# Patient Record
Sex: Female | Born: 1950 | Race: White | Hispanic: No | Marital: Married | Smoking: Former smoker
Health system: Southern US, Community
[De-identification: ages and names within clinical notes are randomized; demographics above are authoritative.]

## PROBLEM LIST (undated history)

## (undated) DIAGNOSIS — I2510 Atherosclerotic heart disease of native coronary artery without angina pectoris: Secondary | ICD-10-CM

## (undated) DIAGNOSIS — T7840XA Allergy, unspecified, initial encounter: Secondary | ICD-10-CM

## (undated) DIAGNOSIS — M069 Rheumatoid arthritis, unspecified: Secondary | ICD-10-CM

## (undated) DIAGNOSIS — E079 Disorder of thyroid, unspecified: Secondary | ICD-10-CM

## (undated) DIAGNOSIS — E785 Hyperlipidemia, unspecified: Secondary | ICD-10-CM

## (undated) DIAGNOSIS — K7460 Unspecified cirrhosis of liver: Secondary | ICD-10-CM

## (undated) DIAGNOSIS — I8500 Esophageal varices without bleeding: Secondary | ICD-10-CM

## (undated) DIAGNOSIS — E039 Hypothyroidism, unspecified: Secondary | ICD-10-CM

## (undated) DIAGNOSIS — K219 Gastro-esophageal reflux disease without esophagitis: Secondary | ICD-10-CM

## (undated) HISTORY — DX: Hyperlipidemia, unspecified: E78.5

## (undated) HISTORY — DX: Esophageal varices without bleeding: I85.00

## (undated) HISTORY — DX: Unspecified cirrhosis of liver: K74.60

## (undated) HISTORY — DX: Atherosclerotic heart disease of native coronary artery without angina pectoris: I25.10

## (undated) HISTORY — PX: ROTATOR CUFF REPAIR: SHX139

## (undated) HISTORY — PX: TENDON RELEASE: SHX230

## (undated) HISTORY — PX: CARPAL TUNNEL RELEASE: SHX101

## (undated) HISTORY — DX: Allergy, unspecified, initial encounter: T78.40XA

## (undated) HISTORY — DX: Gastro-esophageal reflux disease without esophagitis: K21.9

## (undated) HISTORY — DX: Rheumatoid arthritis, unspecified: M06.9

## (undated) HISTORY — DX: Disorder of thyroid, unspecified: E07.9

---

## 1981-05-23 HISTORY — PX: ABDOMINAL HYSTERECTOMY: SHX81

## 1997-11-20 ENCOUNTER — Ambulatory Visit (HOSPITAL_COMMUNITY): Admission: RE | Admit: 1997-11-20 | Discharge: 1997-11-20 | Payer: Self-pay | Admitting: *Deleted

## 1998-02-07 ENCOUNTER — Encounter: Payer: Self-pay | Admitting: Emergency Medicine

## 1998-02-07 ENCOUNTER — Emergency Department (HOSPITAL_COMMUNITY): Admission: EM | Admit: 1998-02-07 | Discharge: 1998-02-07 | Payer: Self-pay | Admitting: Emergency Medicine

## 1998-02-13 ENCOUNTER — Ambulatory Visit (HOSPITAL_COMMUNITY): Admission: RE | Admit: 1998-02-13 | Discharge: 1998-02-13 | Payer: Self-pay | Admitting: Family Medicine

## 1998-02-13 ENCOUNTER — Encounter: Payer: Self-pay | Admitting: Family Medicine

## 1998-02-23 ENCOUNTER — Ambulatory Visit (HOSPITAL_COMMUNITY): Admission: RE | Admit: 1998-02-23 | Discharge: 1998-02-23 | Payer: Self-pay | Admitting: Family Medicine

## 1998-02-23 ENCOUNTER — Encounter: Payer: Self-pay | Admitting: Family Medicine

## 1999-03-17 ENCOUNTER — Encounter: Admission: RE | Admit: 1999-03-17 | Discharge: 1999-03-17 | Payer: Self-pay | Admitting: Internal Medicine

## 1999-03-17 ENCOUNTER — Encounter: Payer: Self-pay | Admitting: Internal Medicine

## 1999-04-30 ENCOUNTER — Ambulatory Visit (HOSPITAL_COMMUNITY): Admission: RE | Admit: 1999-04-30 | Discharge: 1999-04-30 | Payer: Self-pay | Admitting: Family Medicine

## 1999-04-30 ENCOUNTER — Encounter: Payer: Self-pay | Admitting: Family Medicine

## 2000-07-28 ENCOUNTER — Encounter: Payer: Self-pay | Admitting: Family Medicine

## 2000-07-28 ENCOUNTER — Encounter: Admission: RE | Admit: 2000-07-28 | Discharge: 2000-07-28 | Payer: Self-pay | Admitting: Family Medicine

## 2000-08-17 ENCOUNTER — Other Ambulatory Visit: Admission: RE | Admit: 2000-08-17 | Discharge: 2000-08-17 | Payer: Self-pay | Admitting: Family Medicine

## 2000-08-21 ENCOUNTER — Encounter: Payer: Self-pay | Admitting: Family Medicine

## 2000-08-21 LAB — CONVERTED CEMR LAB: Pap Smear: NORMAL

## 2003-11-26 ENCOUNTER — Inpatient Hospital Stay (HOSPITAL_COMMUNITY): Admission: EM | Admit: 2003-11-26 | Discharge: 2003-11-28 | Payer: Self-pay | Admitting: Emergency Medicine

## 2004-01-20 ENCOUNTER — Observation Stay (HOSPITAL_COMMUNITY): Admission: EM | Admit: 2004-01-20 | Discharge: 2004-01-21 | Payer: Self-pay | Admitting: *Deleted

## 2004-03-23 ENCOUNTER — Ambulatory Visit: Payer: Self-pay | Admitting: Family Medicine

## 2004-04-07 ENCOUNTER — Ambulatory Visit: Payer: Self-pay | Admitting: *Deleted

## 2004-05-14 ENCOUNTER — Ambulatory Visit: Payer: Self-pay | Admitting: Cardiology

## 2004-11-24 ENCOUNTER — Ambulatory Visit: Payer: Self-pay | Admitting: Family Medicine

## 2004-12-22 ENCOUNTER — Ambulatory Visit: Payer: Self-pay | Admitting: Family Medicine

## 2004-12-23 ENCOUNTER — Ambulatory Visit: Payer: Self-pay | Admitting: Family Medicine

## 2004-12-28 ENCOUNTER — Ambulatory Visit: Payer: Self-pay | Admitting: Family Medicine

## 2005-01-14 ENCOUNTER — Ambulatory Visit: Payer: Self-pay | Admitting: Cardiology

## 2005-02-28 ENCOUNTER — Ambulatory Visit: Payer: Self-pay | Admitting: Family Medicine

## 2005-03-23 ENCOUNTER — Ambulatory Visit: Payer: Self-pay | Admitting: Family Medicine

## 2005-06-21 ENCOUNTER — Ambulatory Visit: Payer: Self-pay | Admitting: Family Medicine

## 2005-08-24 ENCOUNTER — Ambulatory Visit: Payer: Self-pay | Admitting: Family Medicine

## 2005-10-21 ENCOUNTER — Ambulatory Visit: Payer: Self-pay | Admitting: Family Medicine

## 2006-01-20 ENCOUNTER — Ambulatory Visit: Payer: Self-pay | Admitting: Family Medicine

## 2006-02-24 ENCOUNTER — Ambulatory Visit: Payer: Self-pay | Admitting: Cardiovascular Disease

## 2006-05-12 ENCOUNTER — Ambulatory Visit: Payer: Self-pay | Admitting: Family Medicine

## 2006-06-12 ENCOUNTER — Ambulatory Visit: Payer: Self-pay | Admitting: Cardiovascular Disease

## 2006-06-12 LAB — CONVERTED CEMR LAB
Cholesterol: 177 mg/dL (ref 0–200)
Direct LDL: 123.4 mg/dL
HDL: 42.8 mg/dL (ref >39.0)
LDL Cholesterol: 123 mg/dL — ABNORMAL HIGH (ref 0–99)
Total CHOL/HDL Ratio: 4.1
Triglycerides: 55 mg/dL (ref 0–149)
VLDL: 11 mg/dL (ref 0–40)

## 2006-08-29 ENCOUNTER — Ambulatory Visit: Payer: Self-pay | Admitting: Cardiovascular Disease

## 2006-10-12 ENCOUNTER — Ambulatory Visit: Payer: Self-pay | Admitting: Cardiovascular Disease

## 2006-10-12 LAB — CONVERTED CEMR LAB
ALT: 31 units/L (ref 0–40)
AST: 35 units/L (ref 0–37)
Albumin: 3.8 g/dL (ref 3.5–5.2)
Alkaline Phosphatase: 74 units/L (ref 39–117)
Bilirubin, Direct: 0.1 mg/dL (ref 0.0–0.3)
Cholesterol: 158 mg/dL (ref 0–200)
HDL: 35.5 mg/dL — ABNORMAL LOW (ref >39.0)
LDL Cholesterol: 112 mg/dL — ABNORMAL HIGH (ref 0–99)
Total Bilirubin: 0.8 mg/dL (ref 0.3–1.2)
Total CHOL/HDL Ratio: 4.5
Total Protein: 8.4 g/dL — ABNORMAL HIGH (ref 6.0–8.3)
Triglycerides: 55 mg/dL (ref 0–149)
VLDL: 11 mg/dL (ref 0–40)

## 2007-11-20 ENCOUNTER — Telehealth (INDEPENDENT_AMBULATORY_CARE_PROVIDER_SITE_OTHER): Payer: Self-pay | Admitting: *Deleted

## 2008-01-01 ENCOUNTER — Encounter: Payer: Self-pay | Admitting: Family Medicine

## 2008-01-01 DIAGNOSIS — E785 Hyperlipidemia, unspecified: Secondary | ICD-10-CM

## 2008-01-01 DIAGNOSIS — M949 Disorder of cartilage, unspecified: Secondary | ICD-10-CM

## 2008-01-01 DIAGNOSIS — N6019 Diffuse cystic mastopathy of unspecified breast: Secondary | ICD-10-CM

## 2008-01-01 DIAGNOSIS — I2510 Atherosclerotic heart disease of native coronary artery without angina pectoris: Secondary | ICD-10-CM | POA: Insufficient documentation

## 2008-01-01 DIAGNOSIS — E039 Hypothyroidism, unspecified: Secondary | ICD-10-CM

## 2008-01-01 DIAGNOSIS — Z8719 Personal history of other diseases of the digestive system: Secondary | ICD-10-CM

## 2008-01-01 DIAGNOSIS — J309 Allergic rhinitis, unspecified: Secondary | ICD-10-CM | POA: Insufficient documentation

## 2008-01-01 DIAGNOSIS — K219 Gastro-esophageal reflux disease without esophagitis: Secondary | ICD-10-CM

## 2008-01-01 DIAGNOSIS — M899 Disorder of bone, unspecified: Secondary | ICD-10-CM | POA: Insufficient documentation

## 2008-01-01 DIAGNOSIS — G4700 Insomnia, unspecified: Secondary | ICD-10-CM | POA: Insufficient documentation

## 2008-01-02 ENCOUNTER — Ambulatory Visit: Payer: Self-pay | Admitting: Family Medicine

## 2008-01-02 DIAGNOSIS — Z87891 Personal history of nicotine dependence: Secondary | ICD-10-CM

## 2008-01-03 LAB — CONVERTED CEMR LAB
BUN: 7 mg/dL (ref 6–23)
Basophils Absolute: 0.1 10*3/uL (ref 0.0–0.1)
Bilirubin, Direct: 0.1 mg/dL (ref 0.0–0.3)
Chloride: 107 meq/L (ref 96–112)
Cholesterol: 297 mg/dL (ref 0–200)
Direct LDL: 248 mg/dL
Eosinophils Absolute: 0.2 10*3/uL (ref 0.0–0.7)
Glucose, Bld: 129 mg/dL — ABNORMAL HIGH (ref 70–99)
HCT: 46.2 % — ABNORMAL HIGH (ref 36.0–46.0)
MCHC: 34.9 g/dL (ref 30.0–36.0)
MCV: 96.3 fL (ref 78.0–100.0)
Monocytes Absolute: 0.7 10*3/uL (ref 0.1–1.0)
Platelets: 214 10*3/uL (ref 150–400)
Potassium: 4.5 meq/L (ref 3.5–5.1)
RDW: 13 % (ref 11.5–14.6)
Sodium: 140 meq/L (ref 135–145)
TSH: 5.2 microintl units/mL (ref 0.35–5.50)
Total Bilirubin: 0.7 mg/dL (ref 0.3–1.2)
Total CHOL/HDL Ratio: 11
Triglycerides: 56 mg/dL (ref 0–149)

## 2008-01-07 ENCOUNTER — Telehealth (INDEPENDENT_AMBULATORY_CARE_PROVIDER_SITE_OTHER): Payer: Self-pay | Admitting: *Deleted

## 2009-08-04 ENCOUNTER — Ambulatory Visit: Payer: Self-pay | Admitting: Family Medicine

## 2009-09-02 ENCOUNTER — Telehealth: Payer: Self-pay | Admitting: Family Medicine

## 2009-09-21 ENCOUNTER — Ambulatory Visit: Payer: Self-pay | Admitting: Family Medicine

## 2009-11-26 ENCOUNTER — Ambulatory Visit: Payer: Self-pay | Admitting: Family Medicine

## 2009-11-26 DIAGNOSIS — D485 Neoplasm of uncertain behavior of skin: Secondary | ICD-10-CM

## 2009-11-30 LAB — CONVERTED CEMR LAB
AST: 30 units/L (ref 0–37)
BUN: 10 mg/dL (ref 6–23)
Calcium: 9.6 mg/dL (ref 8.4–10.5)
Direct LDL: 230.8 mg/dL
GFR calc non Af Amer: 87.37 mL/min (ref >60)
Glucose, Bld: 100 mg/dL — ABNORMAL HIGH (ref 70–99)
HDL: 42.3 mg/dL (ref >39.00)
Potassium: 4.3 meq/L (ref 3.5–5.1)
Sodium: 139 meq/L (ref 135–145)
Triglycerides: 71 mg/dL (ref 0.0–149.0)
VLDL: 14.2 mg/dL (ref 0.0–40.0)

## 2010-01-28 ENCOUNTER — Ambulatory Visit: Payer: Self-pay | Admitting: Family Medicine

## 2010-01-28 DIAGNOSIS — M5412 Radiculopathy, cervical region: Secondary | ICD-10-CM | POA: Insufficient documentation

## 2010-04-21 LAB — CONVERTED CEMR LAB
Cholesterol: 234 mg/dL — ABNORMAL HIGH (ref 0–200)
Direct LDL: 186.6 mg/dL
HDL: 35.8 mg/dL — ABNORMAL LOW (ref >39.00)
VLDL: 10.8 mg/dL (ref 0.0–40.0)
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
## 2010-06-22 NOTE — Progress Notes (Signed)
Summary: wrist pain  Phone Note Call from Patient Call back at [facility] Phone [phone] Call back at Work Phone [phone]   Caller: Patient Call For: Judith Part MD Summary of Call: Pt states she has pain in both wrists x 2 weeks.  She has been taking zocor for about a month and asks it that could be the cause.  This pain wakes her up at night.  Please advise. Initial call taken by: Lowella Petties CMA,  September 02, 2009 8:54 AM  Follow-up for Phone Call        I doubt it -- if pain from her zocor-would expect all over body if this continues - sched appt with Dr Patsy Lager (sport med) or myself Follow-up by: Judith Part MD,  September 02, 2009 9:03 AM  Additional Follow-up for Phone Call Additional follow up Details #1::        Patient notified as instructed by telephone. Lewanda Rife LPN  September 02, 2009 1:02 PM

## 2010-10-08 NOTE — Letter (Signed)
February 24, 2006    Marne A. Tower, MD  [address] [place], [place] [address]   RE:  Jean Tucker, Jean Tucker  MRN:  [id number]  /  DOB:  [birth year]   Dear Dr. Milinda Antis:   It was my pleasure to evaluate Ferol Luz at the [facility] this morning.  As you know she is a very pleasant 60 year old woman  with coronary artery disease who is status post balloon angioplasty of a  diagonal branch back in 2005.  She has done very well from a symptomatic  standpoint since that time.  Today she reports no recent chest pain,  dyspnea, orthopnea, PND, palpitations, lightheadedness, syncope, edema, or  claudication symptoms.  She is doing an excellent job with exercises. She is  briskly walking two miles daily which takes her approximately 30 minutes.  She has worked down from that same distance taking 50 minutes.  She  continues to smoke cigarettes at a reduced number of  four to five per day.   She recently had blood work done dated January 20, 2006.  She had a mildly  elevated glucose of 105, normal creatinine of 0.8 and marked hyperlipidemia  with a total cholesterol of 343, normal triglycerides at 88, low HDL of 34,  and an elevated LDL of 298.  Her hemoglobin A1C was in the normal level at  5.7.   She has taken Lipitor in the past but had elevated liver function tests and  this was discontinued.   CURRENT MEDICATIONS:  1. Aspirin 325 mg daily.  2. Synthroid 50 mcg daily.  3. Multivitamin daily.   PHYSICAL EXAMINATION:  GENERAL:  She is alert and oriented, in no acute  distress.  VITAL SIGNS:  Weight is 186 pounds, blood pressure 146/88. On my recheck was  150/84.  Heart rate 67, respiratory rate 12.  HEENT:  Sclerae anicteric.  Conjunctivae pink.  ENT exam shows oropharynx is  clear, moist oral mucosa.  NECK:  Normal carotid upstrokes without bruits.  Jugular venous pressure is  normal.  LUNGS:  Clear to auscultation bilaterally.  CARDIOVASCULAR:  The apex is  discrete and nondisplaced.  Heart is regular  rate and rhythm without murmurs or gallops.  ABDOMEN:  Soft, nontender.  No organomegaly.  No abdominal bruits.  EXTREMITIES:  No clubbing, cyanosis or edema.  Peripheral pulses are 2+ and  equal throughout.   Her XBJ:[id number] normal sinus rhythm and is within normal limits.   ASSESSMENT:  This is a 60 year old woman with the following cardiovascular  issues:  1. Coronary artery disease.  She remains stable without angina.  No      indication for any ischemic evaluation at this point.  Will continue      with risk factor modification as outlined below. She should continue on      daily aspirin.  2. Dyslipidemia.  The patient has a markedly elevated LDL.  She has been      intolerant to Lipitor due to elevated liver function tests.  Will start      her on 10 mg of Crestor.  She will clearly need upward titration and      likely multiple medications for her dyslipidemia.  I would be happy to      help guide her therapy.  I have asked her to have repeat lipids and      LFTs drawn at the time of her next blood work and that is currently  scheduled in your office for December.  At that point we will make      necessary adjustments.  3. High blood pressure in the office today.  The patient reports normal      blood pressures in your office and when checked on a regular  basis.      We should watch her blood pressure closely as she may ultimately      require treatment for hypertension if this is documented on subsequent      office visits.  If she requires therapy, would recommend initiation of      a low dose beta blocker to start in the setting of her coronary artery      disease.   I plan on seeing her back in clinic in six months or sooner if any new  issues arise.   Thank you again for the opportunity to evaluate Mrs. Beers.  Please feel  free to contact me at any time with questions regarding her care.    Sincerely,      Veverly Fells. Excell Seltzer, MD    MDC/MedQ  /  Job #:  [id number]  DD:  02/24/2006 / DT:  02/26/2006

## 2010-10-08 NOTE — H&P (Signed)
NAME:  Jean Tucker, Jean Tucker                         ACCOUNT NO.:  [id number]   MEDICAL RECORD NO.:  [id number]                   PATIENT TYPE:  INP   LOCATION:  1826                                 FACILITY:  [facility]   PHYSICIAN:  Eudora Bing, M.D.               DATE OF BIRTH:  1951-04-04   DATE OF ADMISSION:  11/26/2003  DATE OF DISCHARGE:                                HISTORY & PHYSICAL   REFERRING PHYSICIAN:  Lavonda Jumbo, M.D.   PRIMARY CARE PHYSICIAN:  Marne A. Tower, M.D. [facility]   HISTORY OF PRESENT ILLNESS:  A 60 year old woman admitted with angina.  The  patient has no significant past cardiac history.  She has never had cardiac  symptoms, nor undergone cardiology assessment or testing.  For the past two  months, she has noted exertional chest tightness associated with dyspnea  that has been progressive.  The pain is moderately intense.  There is some  radiation to the left shoulder.  There is generally associated dyspnea and  sometimes diaphoresis.  Symptoms last a number of minutes and resolve with  rest.  The trigger has always been exertional, but the level of exertion has  progressively decreased.   Cardiovascular risk factors include a long history of tobacco use that has  gradually decreased in recent years.  She currently smokes just a few  cigarettes per day, but total consumption is approximately 50-pack-years.  She has not had hypertension or diabetes.  She underwent a partial  hysterectomy at age 10 and has had symptoms of the menopause in the past two  years.  She has not been told of hyperlipidemia.  She does have a family  history of coronary artery disease - both her mother and father suffered  myocardial infarctions.   PAST MEDICAL HISTORY:  Otherwise notable for bilateral shoulder surgery.  She reports an adverse effect to Codeine in the past - she has no true  medical allergies.  She takes no medication on a routine basis.   SOCIAL HISTORY:  She lives in  [place] with her husband.  Employed as  an Airline pilot.  Has three children.  Denies excessive use of alcohol.   REVIEW OF SYSTEMS:  Mild dyspnea on exertion; all other systems reviewed and  are negative.   PHYSICAL EXAMINATION:  GENERAL:  Pleasant woman in no acute distress.  VITAL SIGNS:  Temperature 98, heart rate 70, respirations 20, blood pressure  165/75.  O2 saturation 98% on room air.  HEENT:  Normal lids and conjunctivae; anicteric sclerae.  NECK:  No jugular venous distention; no carotid bruits.  ENDOCRINE:  No thyromegaly.  HEMATOPOIETIC:  No adenopathy.  LUNGS:  Clear.  HEART:  Grade 1/6 systolic murmur at the left sternal border.  Normal first  and second heart sounds.  ABDOMEN:  Soft and nontender; no bruits; no organomegaly.  Aortic pulsation  not palpable.  EXTREMITIES:  Normal distal pulses.  No edema.  NEUROMUSCULAR:  Symmetric strength and tone.  MUSCULOSKELETAL:  No significant joint deformities.   EKG; sinus rhythm, within normal limits.   Point of care markers negative.  Other laboratory studies pending.   IMPRESSION:  The patient presents with classic progressive angina without  excessive risk factors. The likelihood of coronary artery disease as well  over 50%.  The risks and benefits of coronary angiography were described to  her - she agrees to proceed.  In the interim, she would be treated with beta  blocker, aspirin, IV nitroglycerin, and IV heparin.   She has been encouraged to completely give up the use of tobacco products.  A lipid profile is pending.  Blood pressure is currently elevated and may  require treatment, but no specific antihypertensives will be initiated at  present.                                                Tuttle Bing, M.D.    RR/MEDQ  D:  11/26/2003  T:  11/26/2003  Job:  [id number]

## 2010-10-08 NOTE — Assessment & Plan Note (Signed)
Jean Tucker                            CARDIOLOGY OFFICE NOTE   NAME:Jean Tucker                      MRN:          [id number]  DATE:08/29/2006                            DOB:          [birth year]    Jean Tucker returns for outpatient followup at the [facility] on August 29, 2006. She is a 60 year old woman with coronary artery  disease who had angioplasty of a diagonal branch back in 2005. She had  no other significant disease at that time. She presents for followup of  her coronary artery disease, dyslipidemia, and hypertension.   She continues to do well from a symptomatic standpoint. She has no chest  pain, dyspnea, palpitations, lightheadedness, syncope, orthopnea or PND.   MEDICATIONS:  Are unchanged and include:  1. Multivitamin daily.  2. Synthroid 50 mcg daily.  3. Aspirin 325 mg daily.  4. Crestor 10 mg daily.  5. Calcium plus D daily.   ALLERGIES:  PLAVIX AND CODEINE.   PHYSICAL EXAMINATION:  She is alert and oriented in no acute distress.  Blood pressure is 125/78, heart rate 71, respiratory rate is 16, weight  is 189 pounds.  HEENT: Normal.  NECK: Normal carotid upstrokes without bruits. Jugular venous pressure  is normal.  LUNGS:  Clear to auscultation bilaterally.  HEART: The heart is regular rate and rhythm without murmurs or gallops.  ABDOMEN: Soft, obese and nontender. No organomegaly.  EXTREMITIES: No clubbing, cyanosis or edema. Peripheral pulses are 2+  and equal throughout.   EKG: Shows normal sinus rhythm and is within normal limits.   Lipids from January 21, show a total cholesterol of 177, triglycerides  of 55, HDL of 43, LDL of 123. This is in comparison to lipids from August 24, 2005, which show a total cholesterol of 369, triglycerides of 71, HDL  of 39 and LDL of 315.   ASSESSMENT:  1. Coronary artery disease. The patient had diagonal branch disease      treated with angioplasty. She has no  evidence of recurrent ischemia      or problems. She should continue with aggressive risk factor      modification to include aspirin and statin therapy.  2. Dyslipidemia. 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 She has had a dramatic response to low dose      Crestor. We increased her Crestor to 20 mg in late January to try      to get her down to goal, which I think an LDL cholesterol less than      100 would be fantastic considering her starting LDL was 315. She is      scheduled for repeat lipids on May 22.   For followup, I will plan on seeing her back in one year. She will  continue to follow with Dr. Milinda Tucker for her primary care.     Veverly Fells. Excell Seltzer, MD  Electronically Signed    MDC/MedQ  DD: 08/29/2006  DT: 08/29/2006  Job #: [id number]   cc:   Jean A.  Milinda Antis, MD

## 2010-10-08 NOTE — Cardiovascular Report (Signed)
NAME:  Jean, Tucker                         ACCOUNT NO.:  [id number]   MEDICAL RECORD NO.:  [id number]                   PATIENT TYPE:  INP   LOCATION:  4710                                 FACILITY:  [facility]   PHYSICIAN:  Carole Binning, M.D. [facility]         DATE OF BIRTH:  04-16-1951   DATE OF PROCEDURE:  01/20/2004  DATE OF DISCHARGE:  01/21/2004                              CARDIAC CATHETERIZATION   PROCEDURE PERFORMED:  Left heart catheterization with coronary arteriography  and left ventriculography.   INDICATION:  [name] is a 60 year old woman who underwent PTCA of a  diagonal branch approximately seven weeks ago.  She presented to the office  today with three days of progressing substernal chest pain including chest  pain at rest today.  She was thus referred for urgent cardiac  catheterization.   PROCEDURAL NOTE:  A 6-French sheath was placed in the right femoral artery.  Coronary angiography was performed using standard Judkins' 6-French  catheters.  Left ventriculography was performed with an angled pigtail  catheter.  Contrast was Omnipaque.  There were no complications.   RESULTS:   HEMODYNAMICS:  Left ventricular pressure 128/14, aortic pressure 140/76.  There is no aortic valve gradient.   LEFT VENTRICULOGRAM:  Wall motion is normal.  Ejection fraction is estimated  at greater than 60%.  There is no mitral regurgitation.   CORONARY ARTERIOGRAPHY (LEFT DOMINANT):  The left main is normal.   The left anterior descending artery has a diffuse 30% stenosis in the  proximal vessel and 30% stenosis in the mid-vessel.  The LAD gives rise to a  single small-to-normal size diagonal branch.  This has a diffuse 25%  stenosis at the previous PTCA site.   The left circumflex is a dominant vessel.  It gives rise to a small first  marginal, small second marginal, a normal size third marginal, a normal size  first posterolateral branch, a small second posterolateral  branch, and a  small posterior descending artery.  The first marginal which is a small  vessel has a 75% stenosis at its ostium.  The second marginal which is also  a small vessel has a 50% stenosis at its ostium.  The third marginal which  is normal in size has a 30% stenosis proximally and a 30% stenosis in the  mid-portion.  The first posterolateral branch has a 30% stenosis in the mid-  portion.   The right coronary artery is a small, nondominant vessel.  There is a 50%  stenosis proximally, followed by a 95% stenosis in the mid-vessel.  This is  unchanged from previous catheterization.   IMPRESSIONS:  1.  Normal left ventricular systolic function.  2.  Patent percutaneous transluminal coronary angioplasty site in the      diagonal branch.  3.  Residual small vessel disease which is unchanged from findings at      previous catheterization.   RECOMMENDATIONS:  Medical therapy.  Carole Binning, M.D. [facility]    MWP/MEDQ  D:  01/20/2004  T:  01/21/2004  Job:  [id number]   cc:   Marne A. Tower, M.D. [facility]   Cardiac Cath Lab

## 2010-10-08 NOTE — Discharge Summary (Signed)
NAME:  Jean Tucker, Jean Tucker                         ACCOUNT NO.:  [id number]   MEDICAL RECORD NO.:  [id number]                   PATIENT TYPE:  INP   LOCATION:  6531                                 FACILITY:  [facility]   PHYSICIAN:  Villard Bing, M.D.               DATE OF BIRTH:  1951/04/06   DATE OF ADMISSION:  11/26/2003  DATE OF DISCHARGE:  11/28/2003                           DISCHARGE SUMMARY - REFERRING   PROCEDURE:  Percutaneous balloon angioplasty first diagonal, November 27, 2003.   REASON FOR ADMISSION:  Please refer to dictated admission note.   LABORATORY DATA:  Serial cardiac markers normal.  CBC normal.  B-MET normal.  CPK 43 (post __________  PCI).  Marginally elevated TSH 5.52.  Lipid  profile:  Total cholesterol 260, HDL 42, LDL 203.   Admission chest x-ray:  No congestive heart failure.   HOSPITAL COURSE:  The patient was admitted for evaluation and treatment of  symptoms suggestive of unstable angina pectoris.  She presented on no prior  medication regimen and was started on aspirin, beta-blocker, Lipitor, IV  nitroglycerin, and heparin.  Serial cardiac markers were normal.  The plan  was to proceed with diagnostic coronary angiography.   CARDIAC CATHETERIZATION:  Performed by Dr. Antoine Poche (see report for full  details).  Notable for:  1. A high grade distal RCA (non-dominant) lesion before the RV branch and a     high grade mid first diagonal lesion.  2. Residual anatomy notable for noncritical LAD and circumflex disease.  3. Left ventricular function was normal.  4. Dr. Antoine Poche concluded that this anatomy represented severe branch vessel     including the non-dominant RCA stenosis.  He recommended proceeding with     PCI of the diagonal branch.   This was subsequently successfully performed by Dr. Daisey Must (see  report for full details), with successful balloon dilatation of the 99%  first diagonal lesion to 25% residual stenosis.  There were no noted  complications.   The patient was treated with Integrilin for 18 hours and recommendation is  to continue on Plavix for treatment of acute coronary syndrome.  The patient  was kept for overnight observation and cleared for discharge the following  morning in a hemodynamically stable condition.  Of note, the patient was  noted to have sinus bradycardia in the 40-50 BPM range.  A singular two-  second pause was noted, prior to intervention.  The patient did only receive  one 25 mg dose of Lopressor during her brief stay.  Plans were to  discontinue beta-blocker at the time of discharge.  Of note, the patient  will need a followup of marginally elevated TSH level by her primary care  physician.   MEDICATIONS AT DISCHARGE:  1. Plavix 75 mg every day.  2. Coated aspirin 325 mg every day.  3. Lipitor 40 mg every day.  4. Nitrostat 0.4 mg p.r.n.   INSTRUCTIONS:  1. No heavy lifting, driving x 2 days.  2. Maintain a low fat/cholesterol diet.  3. Call the office if there is any swelling, bleeding at the groin.  4. The patient is allowed to return to work in one week.   The patient will follow up with Dr. Loraine [facility] on July 25th  at 4 p.m.  Subsequent followup will be with Dr. Loraine Leriche Puls[facility].   DISCHARGE DIAGNOSES:  1. Acute coronary syndrome.     a. Normal serial cardiac markers.     b. Status post percutaneous transluminal coronary angioplasty 99% first        diagonal, July 7th.     c. Residual distal 99% RCA (nondominant).     d. Normal left ventricle.  2. Sinus bradycardia.  3. Dyslipidemia.  4. Tobacco.  5. Transient hypertension.  6. Mildly elevated TSH.      Gene Serpe, P.A. [facility]                      [place] Bing, M.D.    GS/MEDQ  D:  11/28/2003  T:  11/28/2003  Job:  [id number]   cc:   Marne A. Milinda Antis, M.D. [facility]

## 2010-10-08 NOTE — Cardiovascular Report (Signed)
NAME:  Jean Tucker, Jean Tucker                         ACCOUNT NO.:  [id number]   MEDICAL RECORD NO.:  [id number]                   PATIENT TYPE:  INP   LOCATION:  6599                                 FACILITY:  [facility]   PHYSICIAN:  Carole Binning, M.D. [facility]         DATE OF BIRTH:  12/23/1950   DATE OF PROCEDURE:  11/27/2003  DATE OF DISCHARGE:                              CARDIAC CATHETERIZATION   PROCEDURE PERFORMED:  Percutaneous transluminal coronary angioplasty of the  first diagonal branch.   INDICATION:  [name] is a 60 year old woman who is admitted with  unstable angina.  Cardiac catheterization performed earlier today by Rollene Rotunda, M.D., revealed a 99% stenosis and a small to moderate sized  diagonal branch arising to the mid LAD.  After review of the images, we  opted to proceed with percutaneous coronary intervention.   PROCEDURE NOTE:  The preexisting 6-French sheath in the right femoral artery  was exchanged over a wire for a sterile 6-French sheath.  Heparin and  Integrilin were administered per protocol.  We used a 6-French JL4 guiding  catheter.  __________ soft coronary guidewire was advanced under  fluoroscopic guidance into the distal aspect of the first diagonal branch.  We initially tried to cross the lesion with a 2.0 x 15 mm Maverick balloon;  however, this would not cross the lesion due to the severity of the stenosis  and small vessel size.  We therefore went back in with a 1.5 x 15 mm  Maverick balloon and were able to cross the lesion.  We performed two  inflations to 12 atm each.  We then went back with our 2.0 x 15 mm Maverick  balloon and this time, we were able to successfully cross the lesion.  We  performed four inflations.  The initial one was to 10 atm and then we  performed three inflations each to 5 atm.  Intermittent doses of  nitroglycerin were administered.  Final angiographic images were obtained  revealing patency of the diagonal branch  with approximately 25% residual  stenosis with haziness but TIMI-3 flow.   COMPLICATIONS:  None.   RESULTS:  Successful PTCA of the first diagonal branch.  A 99% stenosis was  reduced to 25% residual with haziness but TIMI-3 flow.   PLAN:  Integrilin will be continued overnight.  It is recommended the  patient be treated with Plavix for her acute coronary syndrome.  She also  needs aggressive risk factor modification.                                               Carole Binning, M.D. [facility]    MWP/MEDQ  D:  11/27/2003  T:  11/28/2003  Job:  [id number]   cc:   Marne A. Milinda Antis, M.D. [facility]  [place]yle Bing, M.D.

## 2010-10-08 NOTE — Cardiovascular Report (Signed)
NAME:  Jean Tucker, Jean Tucker                         ACCOUNT NO.:  [id number]   MEDICAL RECORD NO.:  [id number]                   PATIENT TYPE:  INP   LOCATION:  2010                                 FACILITY:  [facility]   PHYSICIAN:  Rollene Rotunda, M.D.                DATE OF BIRTH:  [birth year]   DATE OF PROCEDURE:  11/27/2003  DATE OF DISCHARGE:                              CARDIAC CATHETERIZATION   PRIMARY CARE PHYSICIAN:  Marne A. Milinda Antis, M.D.   PRIMARY CARDIOLOGIST:  [employer] Bing, M.D.   PROCEDURES PERFORMED:  1. Left heart catheterization.  2. Coronary arteriography.   CARDIOLOGIST:  Rollene Rotunda, M.D.   INDICATIONS:  Patient with unstable angina.   PROCEDURAL NOTE:  Left heart catheterization was performed via the right  femoral artery.  The artery was cannulated using anterior wall puncture.  Number six French arterial sheath was inserted via the modified Seldinger  technique.  Preformed Judkins and a pigtail catheter were utilized.   The patient tolerated the procedure well and left the lab in stable  condition.   RESULTS:   HEMODYNAMIC DATA:  LV 110/26.  Aortic output 107/78.   ARTERIOGRAPHIC DATA:  Coronaries  Left Main:  The left main is normal.   LAD:  The LAD was large wrapping the apex.  There were proximal tandem 25%  lesions.  There were mid tandem 30% lesions.  There were diffuse luminal  irregularities.  The first diagonal was of moderate size with mid 99%  stenosis.   Circumflex:  The circumflex was a dominant vessel.  In the AV groove there  were luminal irregularities.  The OM-1 was small with ostial 80% stenosis.  The OM-2 was small with ostial 80% stenosis.  The OM-3 was large with  proximal 30% stenosis and diffuse luminal irregularities.  OM-4 was large  and branching with long proximal and mid 30% stenoses.  There were three  small-to-moderate size posterolaterals.   Right Coronary Artery:  The right coronary artery is nondominant.  There was  99% stenosis before an RV branch.   VENTRICULOGRAPHIC DATA:  Left Ventriculogram:  The left ventriculogram was  obtained in the RAO projection.  The EF was 65% with normal wall motion.   CONCLUSION:  1. Severe branch vessel obstruction and nondominant right coronary artery     stenoses.  2. Normal left ventricular function.   PLAN:  I will review these films with Dr. Gerri Spore to consider PCI of the  diagonal.  The nondominant right appears to be too narrowed caliber for  intervention.  She will need aggressive secondary risk reduction.                                               Rollene Rotunda, M.D.    JH/MEDQ  D:  11/27/2003  T:  11/27/2003  Job:  [id number]   cc:   Marne A. Milinda Antis, M.D. [facility]

## 2010-10-08 NOTE — Discharge Summary (Signed)
NAME:  Jean Tucker, Jean Tucker                         ACCOUNT NO.:  [id number]   MEDICAL RECORD NO.:  [id number]                   PATIENT TYPE:  INP   LOCATION:  4710                                 FACILITY:  [facility]   PHYSICIAN:  Rollene Rotunda, M.D.                DATE OF BIRTH:  April 19, 1951   DATE OF ADMISSION:  01/20/2004  DATE OF DISCHARGE:  01/21/2004                                 DISCHARGE SUMMARY   PRIMARY CARE PHYSICIAN:  Marne A. Tower, M.D.   DISCHARGE DIAGNOSES:  1.  Status post percutaneous coronary intervention of diagonal in July of      2005.  2.  Today this admission, left heart catheterization via right femoral      artery without complications showing left ventricular within normal      limits, ejection fraction greater than 60%, no mitral regurgitation.      Patent percutaneous transluminal coronary angioplasty.  No change from      previous catheterization.  The patient was seen in our office on December 26, 2003 for posthospital follow up.  Diagnosis coronary artery disease      with status post percutaneous transluminal coronary angioplasty high      grade first diagonal November 27, 2003.  Residual high grade distal right      coronary artery nondominant normal left ventricular function.  3.  History of tobacco abuse.  4.  Dyslipidemia.  5.  Hypothyroidism.  6.  Status post bradycardia which was improved when the patient was      discontinued from her beta blocker.  The patient was instructed at that      time to continue her Plavix for one year and that was in November 27, 2003.      At that time, Dr. Rollene Rotunda concluded that the patient's anatomy      represented severe branch vessel including the nondominant right      coronary artery stenosis.  He recommended proceeding with the      percutaneous coronary intervention of the diagonal branch.  This was      subsequently successfully performed by Dr. Emilie Rutter. Pulsipher with      successful balloon dilatation of the  99% first diagonal lesion to 25%      stenosis.  The patient did have a sinus bradyarrhythmia in the 40s and      50s and a single two second pause was noted prior to intervention.  At      that time, the beta blocker was discontinued.   HOSPITAL COURSE:  Today, on the 31st, the patient is being discharged home.  The patient is being discontinued.  The patient has complained of bad taste  in her mouth that could be due to the Plavix.  It was decided to start Imdur  30 mg daily and continue her other medications.   This admission,  the patient's enzymes have been negative  postcatheterization.  Troponin was 0.01.  CK-MB of 1, CK total of 74.  Her  TSH level was 2.793.  Chemistry on the 30th was a sodium of 135 and a  potassium of 3.6, BUN of 6, and a creatinine of 0.8.  She had a hemoglobin  of 14.1, hematocrit of 41.5, and a platelet count of 304,000.   At the time of discharge, her temperature was 97.1, pulse 47, respirations  18, blood pressure 100/48, and 96% on room air.  The patient's initial  complaint this admission was chest heaviness increased with exertion and the  heat.  Also complains of radiating to the left arm.  She had already been  seen by our physicians and arrived with cardiac catheterization orders.   DISCHARGE MEDICATIONS:  1.  The patient is being discharged home with Imdur 30 mg.  2.  Lipitor 40 mg q.d.  3.  Synthroid 25 mcg q.d.  4.  Enteric-coated aspirin 325 mg q.d.  5.  The patient was instructed to use Tylenol for general discomfort and      nitroglycerin as directed for chest discomfort.   ACTIVITY:  No driving x 2 days.  No strenuous activity x 2 days.  No heavy  lifting x 1 week.  The patient was sent home with a note to be excused from  work for seven days.   DIET:  Low fat, low salt, low cholesterol.   WOUND CARE:  Gently clean catheter site with soap and water.  No scrubbing,  no tub bathing or swimming x 1 week.  She was instructed to call our  office  for any fever greater than 101, unusual pain, swelling, or drainage from  catheter site.   FOLLOW UP:  She has a follow-up appointment at our office on February 04, 2004 at 4 p.m.      Dorian Pod, NP                       Rollene Rotunda, M.D.    MB/MEDQ  D:  01/21/2004  T:  01/21/2004  Job:  [id number]

## 2010-10-08 NOTE — H&P (Signed)
NAME:  MARLITA, KEIL                         ACCOUNT NO.:  [id number]   MEDICAL RECORD NO.:  [id number]                   PATIENT TYPE:  INP   LOCATION:                                       FACILITY:  [facility]   PHYSICIAN:  Rollene Rotunda, M.D.                DATE OF BIRTH:  10-17-1950   DATE OF ADMISSION:  01/20/2004  DATE OF DISCHARGE:                                HISTORY & PHYSICAL   REASON FOR PRESENTATION:  Evaluate patient with chest pain.   HISTORY OF PRESENT ILLNESS:  The patient is a 60 year old white female with  a history of coronary disease as described below.  She is status post  angioplasty of a diagonal lesion.  She was doing well after that procedure  in early July.  However, she now has had recurrent chest discomfort the past  few weeks.  This is previous to similar cardiac pain.  She gets it  particularly with exertion.  It is moderate.  It feels like a heaviness.  She does not describe radiation to her neck and to her arms.  It is  substernal.  There has been no associated diaphoresis, nausea, vomiting.  It  has been progressive.  She usually stops what she is doing and it will go  away after several minutes.  She had it this morning and presented to her  primary care doctor's and was referred here.  She has not been taking any of  the nitrates.   On an aside, she has not had any new shortness of breath, no PND or  orthopnea.  She has had no new palpitations, presyncope, or syncope.  She  has had a bitter taste in her mouth.   PAST MEDICAL HISTORY:  1.  Coronary artery disease (LAD proximal tandem 25% lesions, mid 30%      stenosis.  First diagonal was moderate size with 99% stenosis.      Circumflex was dominant.  OM1 was small with ostial 80% stenosis.  OM2      was small with 80% stenosis.  OM3 was large with proximal 30% stenosis      and diffuse luminal irregularities.  OM4 was large and branching with      mid 30% stenosis.  Right coronary artery was  nondominant with 99%      stenosis before the RV branch.  The EF was well preserved.  The patient      had PTCA of the first diagonal branch.)  2.  Previous tobacco use.  3.  Hypothyroidism.  4.  Bradycardia on beta blockers.   PAST SURGICAL HISTORY:  1.  Hysterectomy.  2.  Bilateral shoulder surgery.   ALLERGIES:  CODEINE.   MEDICATIONS:  1.  Plavix 75 mg daily.  2.  Lipitor 40 mg daily.  3.  Synthroid 0.25 mg daily.  4.  Aspirin 325 mg daily.   SOCIAL HISTORY:  The patient is married.  She lives in [place].  She  is an Airline pilot.  She has three children.  She does smoke cigarettes.   FAMILY HISTORY:  Contributory for myocardial infarction in first degree  relatives.   REVIEW OF SYSTEMS:  As stated in the HPI.  Positive for recent easy  bruising.  Negative for all other systems.   PHYSICAL EXAMINATION:  GENERAL:  The patient is in no distress.  VITAL SIGNS:  Blood pressure 120/82, heart rate 60 and regular, weight 165  pounds.  HEENT:  Eyelids unremarkable.  Pupils are equal, round, and reactive to  light.  Fundi not visualized.  Oral mucosa unremarkable.  NECK:  No jugular venous distention.  Wave form within normal limits.  Carotid upstroke brisk and symmetric.  No bruits.  No thyromegaly.  LYMPHATICS:  No cervical, axillary, inguinal adenopathy.  LUNGS:  Clear to auscultation bilaterally.  BACK:  No costovertebral angle tenderness.  CHEST:  Unremarkable.  HEART:  PMI not displaced or sustained.  S1 and S2 within normal limits.  No  S3.  No S4.  No murmurs.  ABDOMEN:  Flat.  Positive bowel sounds.  Normal in frequency and pitch.  No  bruits, rebound, guarding, midline pulsatile mass, organomegaly.  SKIN:  No rash, nodules.  EXTREMITIES:  2+ pulses throughout.  No edema, cyanosis, clubbing.  NEUROLOGIC:  Oriented to person, place, and time.  Cranial nerves II-XII  grossly intact.  Motor grossly intact.   EKG:  Sinus rhythm, rate 68, axis within normal limits,  intervals within  normal limits, RSR prime in V1 and V2, no acute ST-T wave changes.   ASSESSMENT/PLAN:  1.  Chest discomfort.  The patient's chest discomfort was reminiscent of her      previous unstable angina.  Therefore, she will be admitted to the      hospital, started on heparin and aspirin.  She will continue the Plavix.      She will get a cardiac catheterization.  The risks and benefits of this      have been described and she agrees to proceed.  This can be done today,      most likely.  2.  Bitter taste.  Will review her medications to see how frequently this      might be listed, particularly with Plavix and Lipitor.  3.  Dyslipidemia.  She will get a fasting lipid profile.                                                Rollene Rotunda, M.D.    Derinda Sis  D:  01/20/2004  T:  01/20/2004  Job:  [id number]   cc:   Marne A. Milinda Antis, M.D. [facility]

## 2010-12-24 ENCOUNTER — Telehealth: Payer: Self-pay | Admitting: *Deleted

## 2010-12-24 MED ORDER — LEVOTHYROXINE SODIUM 50 MCG PO TABS: 50.0000 ug | ORAL_TABLET | Freq: Every day | ORAL | Status: DC

## 2010-12-24 NOTE — Telephone Encounter (Addendum)
Patient asks that her prescription be re-written because her husband changed jobs and her insurance has changed and will not accept any refills from the previous prescription.  Rx, needs to be written for a 90 day supply.  Please call patient when Rx is ready for pick up.  She only has 7 days of medication remaining.  Rx. Printed and in your in box for signature.

## 2010-12-24 NOTE — Telephone Encounter (Signed)
Done and I put in nurse IN box thanks

## 2010-12-24 NOTE — Telephone Encounter (Signed)
Patient notified as instructed by telephone. Prescription left at front desk.  

## 2011-10-11 ENCOUNTER — Telehealth: Payer: Self-pay | Admitting: Family Medicine

## 2011-10-11 MED ORDER — LEVOTHYROXINE SODIUM 50 MCG PO TABS: 50.0000 ug | ORAL_TABLET | Freq: Every day | ORAL | Status: DC

## 2011-10-11 NOTE — Telephone Encounter (Signed)
Pt has 4 days left of synthroid and is needing a med refill and is needing a CPE and labs as well. She was wondering if she could have a med refill visit and a CPE all in one visit so she won't have to do 2 visits.

## 2011-10-11 NOTE — Telephone Encounter (Signed)
Px written for call in   Schedule PE - will take care of med refils then  Labs prior please

## 2011-10-11 NOTE — Telephone Encounter (Signed)
Rx sent to [facility] electronically, patient did not know the name of the mail order company but she provided me with the phone number [phone].  CPX scheduled for 10/18/2011 with labs prior.

## 2011-10-13 ENCOUNTER — Telehealth: Payer: Self-pay | Admitting: Family Medicine

## 2011-10-13 DIAGNOSIS — E785 Hyperlipidemia, unspecified: Secondary | ICD-10-CM

## 2011-10-13 DIAGNOSIS — M899 Disorder of bone, unspecified: Secondary | ICD-10-CM

## 2011-10-13 DIAGNOSIS — E039 Hypothyroidism, unspecified: Secondary | ICD-10-CM

## 2011-10-13 DIAGNOSIS — Z0000 Encounter for general adult medical examination without abnormal findings: Secondary | ICD-10-CM

## 2011-10-13 NOTE — Telephone Encounter (Signed)
Message copied by Judy Pimple on Thu Oct 13, 2011  9:01 PM ------      Message from: Alvina Chou      Created: Wed Oct 12, 2011  9:43 AM      Regarding: labs for Fri 5-24       Patient is scheduled for CPX labs, please order future labs, Thanks , Camelia Eng

## 2011-10-14 ENCOUNTER — Other Ambulatory Visit (INDEPENDENT_AMBULATORY_CARE_PROVIDER_SITE_OTHER): Payer: BC Managed Care – PPO

## 2011-10-14 DIAGNOSIS — M949 Disorder of cartilage, unspecified: Secondary | ICD-10-CM

## 2011-10-14 DIAGNOSIS — E785 Hyperlipidemia, unspecified: Secondary | ICD-10-CM

## 2011-10-14 DIAGNOSIS — E039 Hypothyroidism, unspecified: Secondary | ICD-10-CM

## 2011-10-14 DIAGNOSIS — Z0000 Encounter for general adult medical examination without abnormal findings: Secondary | ICD-10-CM

## 2011-10-14 LAB — LIPID PANEL
Cholesterol: 265 mg/dL — ABNORMAL HIGH (ref 0–200)
Total CHOL/HDL Ratio: 11
VLDL: 12 mg/dL (ref 0.0–40.0)

## 2011-10-14 LAB — CBC WITH DIFFERENTIAL/PLATELET
Basophils Absolute: 0 10*3/uL (ref 0.0–0.1)
Eosinophils Relative: 2.6 % (ref 0.0–5.0)
Lymphs Abs: 2 10*3/uL (ref 0.7–4.0)
Monocytes Relative: 9.3 % (ref 3.0–12.0)
Neutrophils Relative %: 57.7 % (ref 43.0–77.0)
Platelets: 191 10*3/uL (ref 150.0–400.0)
RDW: 14.4 % (ref 11.5–14.6)
WBC: 6.7 10*3/uL (ref 4.5–10.5)

## 2011-10-14 LAB — COMPREHENSIVE METABOLIC PANEL
ALT: 41 U/L — ABNORMAL HIGH (ref 0–35)
Albumin: 3.4 g/dL — ABNORMAL LOW (ref 3.5–5.2)
Alkaline Phosphatase: 56 U/L (ref 39–117)
CO2: 24 mEq/L (ref 19–32)
GFR: 92.63 mL/min (ref >60.00)
Glucose, Bld: 118 mg/dL — ABNORMAL HIGH (ref 70–99)
Potassium: 4.5 mEq/L (ref 3.5–5.1)
Sodium: 138 mEq/L (ref 135–145)
Total Bilirubin: 0.6 mg/dL (ref 0.3–1.2)
Total Protein: 8.8 g/dL — ABNORMAL HIGH (ref 6.0–8.3)

## 2011-10-14 LAB — LDL CHOLESTEROL, DIRECT: Direct LDL: 223.4 mg/dL

## 2011-10-15 LAB — VITAMIN D 25 HYDROXY (VIT D DEFICIENCY, FRACTURES): Vit D, 25-Hydroxy: 27 ng/mL — ABNORMAL LOW (ref 30–89)

## 2011-10-18 ENCOUNTER — Encounter: Payer: Self-pay | Admitting: Family Medicine

## 2011-10-18 ENCOUNTER — Ambulatory Visit (INDEPENDENT_AMBULATORY_CARE_PROVIDER_SITE_OTHER): Payer: BC Managed Care – PPO | Admitting: Family Medicine

## 2011-10-18 VITALS — BP 128/72 | HR 68 | Temp 98.1°F | Ht 62.0 in | Wt 192.8 lb

## 2011-10-18 DIAGNOSIS — E785 Hyperlipidemia, unspecified: Secondary | ICD-10-CM

## 2011-10-18 DIAGNOSIS — E039 Hypothyroidism, unspecified: Secondary | ICD-10-CM

## 2011-10-18 DIAGNOSIS — M899 Disorder of bone, unspecified: Secondary | ICD-10-CM

## 2011-10-18 DIAGNOSIS — M949 Disorder of cartilage, unspecified: Secondary | ICD-10-CM

## 2011-10-18 NOTE — Progress Notes (Signed)
Subjective:    Patient ID: Jean Tucker, female    DOB: Sep 20, 1950, 61 y.o.   MRN: [id number]  HPI    Feeling ok , nothing new going on   This was supposed to be a physical - but for financial reasons she cannot do that .... She lost her job and her husb ins does not pay for "anything" per pt   Gyn exam/ pap  Had hyst in past-- that was for uterine cancer -- never had cervical cancer ? 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  Baked foods   Hypothyroid Lab Results  Component Value Date   TSH 5.61* 10/14/2011  was off of synthroid for a mo- and missed 4 day  Vit D level is 27  Lab Results  Component Value Date   CHOL 265* 10/14/2011   CHOL 284* 11/26/2009   CHOL 234* 09/21/2009   Lab Results  Component Value Date   HDL 23.70* 10/14/2011   HDL 42.30 11/26/2009   HDL 35.80* 09/21/2009   Lab Results  Component Value Date   LDLCALC 112* 10/12/2006   LDLCALC 123* 06/12/2006   Lab Results  Component Value Date   TRIG 60.0 10/14/2011   TRIG 71.0 11/26/2009   TRIG 54.0 09/21/2009   Lab Results  Component Value Date   CHOLHDL 11 10/14/2011   CHOLHDL 7 11/26/2009   CHOLHDL 7 09/21/2009   Lab Results  Component Value Date   LDLDIRECT 223.4 10/14/2011   LDLDIRECT 230.8 11/26/2009   LDLDIRECT 186.6 09/21/2009   has been intolerant to lipitor and zocor in the past and no ins coverage  Not interested in other  meds due to finances   Sugar was 118  Is generally not a big sweet eater  At times she gets shaky- can go down to 60s   Patient Active Problem List  Diagnoses  . NEOPLASM OF UNCERTAIN BEHAVIOR OF SKIN  . HYPOTHYROIDISM  . HYPERLIPIDEMIA  . Former smoker  . CORONARY ARTERY DISEASE  . ALLERGIC RHINITIS  . GERD  . FIBROCYSTIC BREAST DISEASE  . CERVICAL RADICULOPATHY, RIGHT  . OSTEOPENIA  . INSOMNIA  . CONSTIPATION, HX OF  . Routine general medical examination at a health care facility   Past Medical History  Diagnosis Date  . Allergy   . GERD (gastroesophageal reflux disease)   . CAD (coronary artery disease)   . Hyperlipidemia   . Thyroid disease   . Osteoporosis    Past Surgical History  Procedure Date  . Abdominal hysterectomy 1983    partial,bleeding  . Rotator cuff repair     X 4  . Tendon release   . Carpal tunnel release     X 2, right   History  Substance Use  Topics  . Smoking status: Former Smoker    Types: Cigarettes  . Smokeless tobacco: Not on file  . Alcohol Use: Not on file   Family History  Problem Relation Age of Onset  . Diabetes Mother   . Hypertension Mother   . Alcohol abuse Father    Allergies  Allergen Reactions  . Atorvastatin     REACTION: elevated LFT's  . Codeine     REACTION: swelling  . Simvastatin     REACTION: joint pain   Current Outpatient Prescriptions on File Prior to Visit  Medication Sig Dispense Refill  . levothyroxine (SYNTHROID) 50 MCG tablet Take 1 tablet (50 mcg total) by mouth daily.  90 tablet  0         Review of Systems Review of Systems  Constitutional: Negative for fever, appetite change,  and unexpected weight change. pos for fatigue  Eyes: Negative for pain and visual disturbance.  Respiratory: Negative for cough and shortness of breath.   Cardiovascular: Negative for cp or palpitations    Gastrointestinal: Negative for nausea, diarrhea and constipation.  Genitourinary: Negative for urgency  and frequency.  Skin: Negative for pallor or rash   Neurological: Negative for weakness, light-headedness, numbness and headaches. occ shakiness when she needs to eat  Hematological: Negative for adenopathy. Does not bruise/bleed easily.  Psychiatric/Behavioral: Negative for dysphoric mood. The patient is not nervous/anxious.         Objective:   Physical Exam  Constitutional: She appears well-developed and well-nourished. No distress.       Obese and well appearing   HENT:  Head: Normocephalic and atraumatic.  Mouth/Throat: Oropharynx is clear and moist.  Eyes: Conjunctivae and EOM are normal. Pupils are equal, round, and reactive to light. No scleral icterus.  Neck: Normal range of motion. Neck supple. No JVD present. Carotid bruit is not present. No thyromegaly present.  Cardiovascular: Normal rate, regular rhythm and normal heart sounds.   Pulmonary/Chest: Effort normal and breath sounds normal. No respiratory distress. She has no wheezes.       Diffusely distant bs   Abdominal: Soft. Bowel sounds are normal. She exhibits no abdominal bruit.  Musculoskeletal: She exhibits no edema and no tenderness.  Lymphadenopathy:    She has no cervical adenopathy.  Neurological: She is alert. She has normal reflexes. She displays no tremor. No cranial nerve deficit. She exhibits normal muscle tone. Coordination normal.  Skin: Skin is warm and dry. No rash noted. No erythema. No pallor.  Psychiatric: Her affect is blunt.       Pt seems somewhat unhappy that she has to come in for a visit - upset because she cannot afford it           Assessment & Plan:

## 2011-10-18 NOTE — Patient Instructions (Addendum)
For health maintenance you may want to check with your insurance about coverage of a mammogram, colonoscopy or stool card, bone density test and immunizations like flu shot , pneumonia vaccine and tetnus shot    Your liver tests are a little elevated - try to loose weight with healthy diet and exercise  You may have some pre- diabetes (initially that can mean low sugar)- so avoid sweets and try to eat small frequent meals with protien  That will keep sugar from crashing  Call insurance if your thyroid medicine does not come soon  I'm thrilled you quit smoking  Look for vitamin D (on sale) - 1000 mcg daily - your level is a little low   I would like to see you back in 6 months if affordable

## 2011-10-20 NOTE — Assessment & Plan Note (Signed)
tsh is stable No change in dose No clinical changes refil done

## 2011-10-20 NOTE — Assessment & Plan Note (Signed)
Lipids are quite high  Intol of zocor  Is not willing to try med of any other kind at this time  Also cannot afford it , or to eat properly Urged her to aim for low fat diet as much as poss, disc risks of high chol Disc goals for lipids and reasons to control them Rev labs with pt Rev low sat fat diet in detail

## 2012-01-19 ENCOUNTER — Other Ambulatory Visit: Payer: Self-pay | Admitting: Family Medicine

## 2012-01-19 NOTE — Telephone Encounter (Signed)
Refilled Levothyroxine 50 mcg

## 2012-03-14 ENCOUNTER — Other Ambulatory Visit: Payer: Self-pay | Admitting: Family Medicine

## 2012-04-04 ENCOUNTER — Encounter: Payer: Self-pay | Admitting: Family Medicine

## 2012-04-04 ENCOUNTER — Ambulatory Visit (INDEPENDENT_AMBULATORY_CARE_PROVIDER_SITE_OTHER): Payer: BC Managed Care – PPO | Admitting: Family Medicine

## 2012-04-04 ENCOUNTER — Ambulatory Visit (INDEPENDENT_AMBULATORY_CARE_PROVIDER_SITE_OTHER)
Admission: RE | Admit: 2012-04-04 | Discharge: 2012-04-04 | Disposition: A | Discharge status: Home / Self Care | Payer: BC Managed Care – PPO | Source: Ambulatory Visit | Attending: Family Medicine | Admitting: Family Medicine

## 2012-04-04 VITALS — BP 146/96 | HR 60 | Temp 97.9°F | Ht 62.0 in | Wt 199.8 lb

## 2012-04-04 DIAGNOSIS — M79609 Pain in unspecified limb: Secondary | ICD-10-CM

## 2012-04-04 DIAGNOSIS — M2550 Pain in unspecified joint: Secondary | ICD-10-CM

## 2012-04-04 DIAGNOSIS — M25569 Pain in unspecified knee: Secondary | ICD-10-CM

## 2012-04-04 DIAGNOSIS — Z23 Encounter for immunization: Secondary | ICD-10-CM

## 2012-04-04 DIAGNOSIS — M79643 Pain in unspecified hand: Secondary | ICD-10-CM

## 2012-04-04 LAB — CBC WITH DIFFERENTIAL/PLATELET
Basophils Relative: 0.7 % (ref 0.0–3.0)
Eosinophils Relative: 1.9 % (ref 0.0–5.0)
Hemoglobin: 13.7 g/dL (ref 12.0–15.0)
Lymphocytes Relative: 34.5 % (ref 12.0–46.0)
MCV: 97.9 fl (ref 78.0–100.0)
Neutro Abs: 4.1 10*3/uL (ref 1.4–7.7)
Neutrophils Relative %: 54.4 % (ref 43.0–77.0)
RBC: 4.32 Mil/uL (ref 3.87–5.11)
WBC: 7.5 10*3/uL (ref 4.5–10.5)

## 2012-04-04 LAB — SEDIMENTATION RATE: Sed Rate: 55 mm/hr — ABNORMAL HIGH (ref 0–22)

## 2012-04-04 NOTE — Assessment & Plan Note (Signed)
Multiple joints xrays Rheum panel  Adv staying active and working on wt loss

## 2012-04-04 NOTE — Assessment & Plan Note (Signed)
bilat wrist and hand occ swelling  Worse R - R handed Xray today labs

## 2012-04-04 NOTE — Patient Instructions (Addendum)
Flu shot and tetanus shot today  xrays  Labs  Will update you  Tylenol is ok , aleve with caution due to blood pressure

## 2012-04-04 NOTE — Progress Notes (Signed)
Subjective:    Patient ID: Jean Tucker, female    DOB: April 02, 1951, 61 y.o.   MRN: [id number]  HPI Thinks she has some arthritis in several places  Also place/ skin spot on her leg Based on symptoms   Has not been xrayed   Joints are affected by both pain and stiffness  Some swelling in joints of hands  No red joints   Fingers , and both wrists  Also knees - hurt on the inside  Worse L knee  occ ankles   Not very active  Knees hurt first thing in the am - and after working in the yard    Wrists and knees are the worst No fever or rash No hx of autoimmune dz in family   Did gain some weight    Needs Tdap today  Also flu shot   Patient Active Problem List  Diagnosis  . NEOPLASM OF UNCERTAIN BEHAVIOR OF SKIN  . HYPOTHYROIDISM  . HYPERLIPIDEMIA  . Former smoker  . CORONARY ARTERY DISEASE  . ALLERGIC RHINITIS  . GERD  . FIBROCYSTIC BREAST DISEASE  . CERVICAL RADICULOPATHY, RIGHT  . OSTEOPENIA  . INSOMNIA  . CONSTIPATION, HX OF  . Routine general medical examination at a health care facility  . Arthralgia  . Knee pain  . Hand pain   Past Medical History  Diagnosis Date  . Allergy   . GERD (gastroesophageal reflux disease)   . CAD (coronary artery disease)   . Hyperlipidemia   . Thyroid disease   . Osteoporosis    Past Surgical History  Procedure Date  . Abdominal hysterectomy 1983    partial,bleeding  . Rotator cuff repair     X 4  . Tendon release   . Carpal tunnel release     X 2, right   History  Substance Use Topics  . Smoking status: Former Smoker    Types: Cigarettes  . Smokeless tobacco: Not on file  . Alcohol Use: No   Family History  Problem Relation Age of Onset  . Diabetes Mother   . Hypertension Mother   . Alcohol abuse Father    Allergies  Allergen Reactions  . Atorvastatin     REACTION: elevated LFT's  . Codeine     REACTION: swelling  . Simvastatin     REACTION: joint pain   Current Outpatient Prescriptions on  File Prior to Visit  Medication Sig Dispense Refill  . levothyroxine (SYNTHROID, LEVOTHROID) 50 MCG tablet TAKE 1 TABLET (0.05MG ) BY MOUTH DAILY  90 tablet  1     Review of Systems Review of Systems  Constitutional: Negative for fever, appetite change,  and unexpected weight change. pos for baseline fatigue Eyes: Negative for pain and visual disturbance.  Respiratory: Negative for cough and shortness of breath.   Cardiovascular: Negative for cp or palpitations    Gastrointestinal: Negative for nausea, diarrhea and constipation.  Genitourinary: Negative for urgency and frequency.  Skin: Negative for pallor or rash  neg for insect or tick bite  MSK pos for joint pain in multiple areas , joint swelling occ in R fingers,neg for other joint swelling or redness or warmth Neurological: Negative for weakness, light-headedness, numbness and headaches.  Hematological: Negative for adenopathy. Does not bruise/bleed easily.  Psychiatric/Behavioral: Negative for dysphoric mood. The patient is not nervous/anxious.         Objective:   Physical Exam  Constitutional: She appears well-developed and well-nourished. No distress.  obese and well appearing   HENT:  Head: Normocephalic and atraumatic.  Mouth/Throat: Oropharynx is clear and moist.  Eyes: Conjunctivae normal and EOM are normal. Pupils are equal, round, and reactive to light. Right eye exhibits no discharge. Left eye exhibits no discharge. No scleral icterus.  Neck: Normal range of motion. Neck supple. No thyromegaly present.  Cardiovascular: Normal rate and regular rhythm.   Pulmonary/Chest: Effort normal and breath sounds normal.  Abdominal: Soft. Bowel sounds are normal. She exhibits no distension and no mass. There is no tenderness.  Musculoskeletal: She exhibits tenderness. She exhibits no edema.       Right knee: She exhibits normal range of motion, no swelling, no effusion and normal patellar mobility. tenderness found. Medial  joint line and patellar tendon tenderness noted.       Left knee: She exhibits normal range of motion, no swelling, no effusion and normal patellar mobility. tenderness found. Medial joint line and patellar tendon tenderness noted.       No joint swelling evident today No joint deformity whatsoever Neg for any myofascial trigger points  Mild tenderness over ant R wrist , no other hand tenderness Tender over medial joint line bilat knees and to lesser extent patellar tendons  Ankles nl Full rom all joints   Lymphadenopathy:    She has no cervical adenopathy.  Neurological: She is alert. She has normal strength and normal reflexes. She displays no atrophy. No cranial nerve deficit. She exhibits normal muscle tone. Coordination normal.  Skin: Skin is warm and dry. No rash noted. No erythema. No pallor.  Psychiatric: She has a normal mood and affect.          Assessment & Plan:

## 2012-04-04 NOTE — Assessment & Plan Note (Signed)
Worse on L  Med joint line tenderness and crepitus  overwt susp oa Xray today

## 2012-04-05 LAB — RHEUMATOID FACTOR: Rhuematoid fact SerPl-aCnc: <10 IU/mL (ref <=14)

## 2012-04-05 LAB — ANTI-NUCLEAR AB-TITER (ANA TITER)

## 2012-04-05 LAB — ANA: Anti Nuclear Antibody(ANA): POSITIVE — AB

## 2012-04-30 ENCOUNTER — Encounter: Payer: Self-pay | Admitting: Family Medicine

## 2012-04-30 ENCOUNTER — Ambulatory Visit (INDEPENDENT_AMBULATORY_CARE_PROVIDER_SITE_OTHER): Payer: BC Managed Care – PPO | Admitting: Family Medicine

## 2012-04-30 VITALS — BP 132/80 | HR 62 | Temp 98.3°F | Ht 62.0 in | Wt 201.2 lb

## 2012-04-30 DIAGNOSIS — M2550 Pain in unspecified joint: Secondary | ICD-10-CM

## 2012-04-30 NOTE — Assessment & Plan Note (Signed)
This continues to be on and off with nl xrays and nl lab panel with exception of ESR 55  No improvement on prednisone whatsoever - disc poss of PMR Would like to next refer her to rheumatologist- but she cannot yet afford this  She will call when ready for referral and she will also update if worse or new symptoms Unremarkable exam today

## 2012-04-30 NOTE — Patient Instructions (Addendum)
I do not know what is causing your joint pain and swelling  When you are ready to see a specialist (rheumatologist)- please call so we can get the referral going  If worse or new symptoms (like fever or rash or anything else)- please let us know

## 2012-04-30 NOTE — Progress Notes (Signed)
Subjective:    Patient ID: Jean Tucker, female    DOB: 1950-12-28, 61 y.o.   MRN: [id number]  HPI Last visit here pt had c/o of joint pain in multiple areas Xray knee and hands revealed nl results Rheum panel neg except sed rate of 55  Tried her on prednisone - 20mg  and no improvement at all  She still had swelling and aching of her hands with no imp at all - esp over mcp joints  Trouble with knees and ankles too   Does not have the money to go to a specialist  Will call when she is ready   Not taking anything at all right now   Patient Active Problem List  Diagnosis  . NEOPLASM OF UNCERTAIN BEHAVIOR OF SKIN  . HYPOTHYROIDISM  . HYPERLIPIDEMIA  . Former smoker  . CORONARY ARTERY DISEASE  . ALLERGIC RHINITIS  . GERD  . FIBROCYSTIC BREAST DISEASE  . CERVICAL RADICULOPATHY, RIGHT  . OSTEOPENIA  . INSOMNIA  . CONSTIPATION, HX OF  . Routine general medical examination at a health care facility  . Arthralgia  . Knee pain  . Hand pain   Past Medical History  Diagnosis Date  . Allergy   . GERD (gastroesophageal reflux disease)   . CAD (coronary artery disease)   . Hyperlipidemia   . Thyroid disease   . Osteoporosis    Past Surgical History  Procedure Date  . Abdominal hysterectomy 1983    partial,bleeding  . Rotator cuff repair     X 4  . Tendon release   . Carpal tunnel release     X 2, right   History  Substance Use Topics  . Smoking status: Former Smoker    Types: Cigarettes  . Smokeless tobacco: Not on file  . Alcohol Use: No   Family History  Problem Relation Age of Onset  . Diabetes Mother   . Hypertension Mother   . Alcohol abuse Father    Allergies  Allergen Reactions  . Atorvastatin     REACTION: elevated LFT's  . Codeine     REACTION: swelling  . Simvastatin     REACTION: joint pain   Current Outpatient Prescriptions on File Prior to Visit  Medication Sig Dispense Refill  . aspirin 325 MG tablet Take 325 mg by mouth daily.      Marland Kitchen  levothyroxine (SYNTHROID, LEVOTHROID) 50 MCG tablet TAKE 1 TABLET (0.05MG ) BY MOUTH DAILY  90 tablet  1      Review of Systems Review of Systems  Constitutional: Negative for fever, appetite change, fatigue and unexpected weight change.  Eyes: Negative for pain and visual disturbance.  Respiratory: Negative for cough and shortness of breath.   Cardiovascular: Negative for cp or palpitations    Gastrointestinal: Negative for nausea, diarrhea and constipation.  Genitourinary: Negative for urgency and frequency.  Skin: Negative for pallor or rash   Neurological: Negative for weakness, light-headedness, numbness and headaches.  MSK pos for painful and swollen joints intermittently in hands/ ankles  Hematological: Negative for adenopathy. Does not bruise/bleed easily.  Psychiatric/Behavioral: Negative for dysphoric mood. The patient is not nervous/anxious.         Objective:   Physical Exam  Constitutional: She appears well-developed and well-nourished. No distress.       obese and well appearing   HENT:  Head: Normocephalic and atraumatic.  Mouth/Throat: Oropharynx is clear and moist.  Eyes: Conjunctivae normal and EOM are normal. Pupils are equal, round, and reactive  to light. Right eye exhibits no discharge. Left eye exhibits no discharge. No scleral icterus.  Neck: Normal range of motion. Neck supple. No JVD present. Carotid bruit is not present. No thyromegaly present.  Cardiovascular: Normal rate, regular rhythm and intact distal pulses.  Exam reveals no gallop.   Pulmonary/Chest: Effort normal and breath sounds normal. No respiratory distress. She has no wheezes.  Abdominal: She exhibits no abdominal bruit.  Musculoskeletal: Normal range of motion. She exhibits no edema and no tenderness.       Today there is no visible swelling of joints and no tenderness in hands/ ankles / knees   Lymphadenopathy:    She has no cervical adenopathy.  Neurological: She is alert. She has normal  reflexes.  Skin: Skin is warm and dry. No rash noted. No erythema.  Psychiatric: She has a normal mood and affect.          Assessment & Plan:

## 2012-06-18 ENCOUNTER — Telehealth: Payer: Self-pay | Admitting: Family Medicine

## 2012-06-18 DIAGNOSIS — M2550 Pain in unspecified joint: Secondary | ICD-10-CM

## 2012-06-18 NOTE — Telephone Encounter (Signed)
Rheum ref

## 2012-06-18 NOTE — Telephone Encounter (Signed)
Patient needs order for Rhumatologist.

## 2012-07-19 ENCOUNTER — Other Ambulatory Visit: Payer: Self-pay | Admitting: Family Medicine

## 2013-01-19 ENCOUNTER — Other Ambulatory Visit: Payer: Self-pay | Admitting: Family Medicine

## 2013-03-21 ENCOUNTER — Telehealth: Payer: Self-pay | Admitting: Family Medicine

## 2013-03-21 NOTE — Telephone Encounter (Signed)
Pt states her rheumatologist suggested she make an appt to be seen in our office because her blood pressure was high.  The pt stated the top number was 190 but did not state the bottom number.  She said she has been having nose bleeds.  I told her Dr. Milinda Antis is off until Monday but it sounds like she needed to get in sooner.  I offered her an appointment for this afternoon but she said she would not come that late in the day.  She took an appt tomorrow morning at 8:30 with Dr. Para March.

## 2013-03-22 ENCOUNTER — Encounter: Payer: Self-pay | Admitting: Family Medicine

## 2013-03-22 ENCOUNTER — Ambulatory Visit (INDEPENDENT_AMBULATORY_CARE_PROVIDER_SITE_OTHER): Payer: BC Managed Care – PPO | Admitting: Family Medicine

## 2013-03-22 VITALS — BP 150/102 | HR 66 | Temp 97.7°F | Wt 201.5 lb

## 2013-03-22 DIAGNOSIS — I10 Essential (primary) hypertension: Secondary | ICD-10-CM

## 2013-03-22 LAB — BASIC METABOLIC PANEL
Calcium: 9.4 mg/dL (ref 8.4–10.5)
GFR: 78.85 mL/min (ref >60.00)
Glucose, Bld: 132 mg/dL — ABNORMAL HIGH (ref 70–99)
Potassium: 3.9 mEq/L (ref 3.5–5.1)
Sodium: 135 mEq/L (ref 135–145)

## 2013-03-22 MED ORDER — HYDROCHLOROTHIAZIDE 12.5 MG PO TABS: 12.5000 mg | ORAL_TABLET | Freq: Every day | ORAL | Status: DC

## 2013-03-22 NOTE — Progress Notes (Signed)
Was at rheum at SBP was 190.  Advised to get checked today.  Was on humara until 2 weeks ago.  The dose due yesterday was not given.  No h/o HTN, but some prev "borderline" numbers.  Never on meds prev.  No CP; occ BLE edema (attributed to arthritis).  She'll get a little winded if walking 100 yards or more.  On MTX.    Meds, vitals, and allergies reviewed.   ROS: See HPI.  Otherwise, noncontributory.  GEN: nad, alert and oriented HEENT: mucous membranes moist NECK: supple w/o LA CV: rrr.  no murmur PULM: ctab, no inc wob ABD: soft, +bs EXT: no edema SKIN: no acute rash

## 2013-03-22 NOTE — Patient Instructions (Signed)
Go to the lab on the way out.  We'll contact you with your lab report.  Limit salt.  Take 12.5mg  HCTZ a day.  If you get lightheaded or your BP is <120/<70, then cut the medicine out. Check your BP a few times in the next week and notify Dr. Milinda Antis.

## 2013-03-24 DIAGNOSIS — I10 Essential (primary) hypertension: Secondary | ICD-10-CM | POA: Insufficient documentation

## 2013-03-24 NOTE — Assessment & Plan Note (Signed)
Start HCTZ 12.5 mg a day and notify PCP about BPs next week, see notes on labs.  Okay for outpatient f/u.

## 2013-03-28 ENCOUNTER — Other Ambulatory Visit: Payer: Self-pay

## 2013-04-03 ENCOUNTER — Other Ambulatory Visit: Payer: Self-pay | Admitting: Family Medicine

## 2013-06-13 ENCOUNTER — Other Ambulatory Visit: Payer: Self-pay | Admitting: Family Medicine

## 2013-06-13 NOTE — Telephone Encounter (Signed)
Please schedule f/u in the spring and refill until then  

## 2013-06-13 NOTE — Telephone Encounter (Signed)
Electronic refill request, no recent appt. With you, one appt with Dr. Damita Dunnings on 03/22/13 for hypertension, please advise

## 2013-06-17 NOTE — Telephone Encounter (Signed)
appt scheduled for labs on 06/19/13 and f/u on 06/24/13

## 2013-06-19 ENCOUNTER — Other Ambulatory Visit (INDEPENDENT_AMBULATORY_CARE_PROVIDER_SITE_OTHER): Payer: BC Managed Care – PPO

## 2013-06-19 ENCOUNTER — Telehealth (INDEPENDENT_AMBULATORY_CARE_PROVIDER_SITE_OTHER): Payer: BC Managed Care – PPO | Admitting: Family Medicine

## 2013-06-19 DIAGNOSIS — M949 Disorder of cartilage, unspecified: Secondary | ICD-10-CM

## 2013-06-19 DIAGNOSIS — E785 Hyperlipidemia, unspecified: Secondary | ICD-10-CM

## 2013-06-19 DIAGNOSIS — I10 Essential (primary) hypertension: Secondary | ICD-10-CM

## 2013-06-19 DIAGNOSIS — M899 Disorder of bone, unspecified: Secondary | ICD-10-CM

## 2013-06-19 DIAGNOSIS — E039 Hypothyroidism, unspecified: Secondary | ICD-10-CM

## 2013-06-19 LAB — COMPREHENSIVE METABOLIC PANEL
ALBUMIN: 3.7 g/dL (ref 3.5–5.2)
ALK PHOS: 54 U/L (ref 39–117)
ALT: 17 U/L (ref 0–35)
AST: 26 U/L (ref 0–37)
BUN: 10 mg/dL (ref 6–23)
CO2: 24 mEq/L (ref 19–32)
Calcium: 9 mg/dL (ref 8.4–10.5)
Chloride: 107 mEq/L (ref 96–112)
Creatinine, Ser: 0.8 mg/dL (ref 0.4–1.2)
GFR: 82.38 mL/min (ref >60.00)
Glucose, Bld: 132 mg/dL — ABNORMAL HIGH (ref 70–99)
POTASSIUM: 3.8 meq/L (ref 3.5–5.1)
SODIUM: 136 meq/L (ref 135–145)
TOTAL PROTEIN: 9 g/dL — AB (ref 6.0–8.3)
Total Bilirubin: 0.5 mg/dL (ref 0.3–1.2)

## 2013-06-19 LAB — CBC WITH DIFFERENTIAL/PLATELET
BASOS ABS: 0 10*3/uL (ref 0.0–0.1)
Basophils Relative: 0.4 % (ref 0.0–3.0)
EOS ABS: 0.1 10*3/uL (ref 0.0–0.7)
Eosinophils Relative: 1.9 % (ref 0.0–5.0)
HCT: 40.7 % (ref 36.0–46.0)
Hemoglobin: 13.2 g/dL (ref 12.0–15.0)
Lymphocytes Relative: 28.5 % (ref 12.0–46.0)
Lymphs Abs: 2.1 10*3/uL (ref 0.7–4.0)
MCHC: 32.5 g/dL (ref 30.0–36.0)
MCV: 99.6 fl (ref 78.0–100.0)
MONO ABS: 0.8 10*3/uL (ref 0.1–1.0)
Monocytes Relative: 11.5 % (ref 3.0–12.0)
NEUTROS PCT: 57.7 % (ref 43.0–77.0)
Neutro Abs: 4.2 10*3/uL (ref 1.4–7.7)
PLATELETS: 221 10*3/uL (ref 150.0–400.0)
RBC: 4.09 Mil/uL (ref 3.87–5.11)
RDW: 14.7 % — AB (ref 11.5–14.6)
WBC: 7.3 10*3/uL (ref 4.5–10.5)

## 2013-06-19 LAB — LIPID PANEL
CHOL/HDL RATIO: 10
Cholesterol: 273 mg/dL — ABNORMAL HIGH (ref 0–200)
HDL: 26.2 mg/dL — AB (ref >39.00)
Triglycerides: 75 mg/dL (ref 0.0–149.0)
VLDL: 15 mg/dL (ref 0.0–40.0)

## 2013-06-19 LAB — LDL CHOLESTEROL, DIRECT: LDL DIRECT: 225.7 mg/dL

## 2013-06-19 LAB — TSH: TSH: 4.93 u[IU]/mL (ref 0.35–5.50)

## 2013-06-19 NOTE — Telephone Encounter (Signed)
Message copied by Abner Greenspan on Wed Jun 19, 2013  7:14 AM ------      Message from: Marchia Bond      Created: Mon Jun 17, 2013 11:59 AM      Regarding: F/u labs Wed 06/19/13       Please order  future f/u labs for pt's upcomming lab appt.      Thanks      Tasha       ------

## 2013-06-20 LAB — VITAMIN D 25 HYDROXY (VIT D DEFICIENCY, FRACTURES): Vit D, 25-Hydroxy: 28 ng/mL — ABNORMAL LOW (ref 30–89)

## 2013-06-21 ENCOUNTER — Telehealth: Payer: Self-pay | Admitting: Family Medicine

## 2013-06-21 NOTE — Telephone Encounter (Signed)
Relevant patient education assigned to patient using Emmi. ° °

## 2013-06-24 ENCOUNTER — Ambulatory Visit (INDEPENDENT_AMBULATORY_CARE_PROVIDER_SITE_OTHER): Payer: BC Managed Care – PPO | Admitting: Family Medicine

## 2013-06-24 ENCOUNTER — Encounter: Payer: Self-pay | Admitting: Family Medicine

## 2013-06-24 VITALS — BP 148/82 | HR 62 | Temp 98.2°F | Ht 62.0 in | Wt 205.2 lb

## 2013-06-24 DIAGNOSIS — E669 Obesity, unspecified: Secondary | ICD-10-CM | POA: Insufficient documentation

## 2013-06-24 DIAGNOSIS — I10 Essential (primary) hypertension: Secondary | ICD-10-CM

## 2013-06-24 DIAGNOSIS — E559 Vitamin D deficiency, unspecified: Secondary | ICD-10-CM

## 2013-06-24 DIAGNOSIS — E039 Hypothyroidism, unspecified: Secondary | ICD-10-CM

## 2013-06-24 DIAGNOSIS — E785 Hyperlipidemia, unspecified: Secondary | ICD-10-CM

## 2013-06-24 MED ORDER — LEVOTHYROXINE SODIUM 50 MCG PO TABS: ORAL_TABLET | ORAL | Status: DC

## 2013-06-24 MED ORDER — HYDROCHLOROTHIAZIDE 12.5 MG PO TABS: 12.5000 mg | ORAL_TABLET | Freq: Every day | ORAL | Status: DC

## 2013-06-24 NOTE — Assessment & Plan Note (Signed)
Hypothyroidism  Pt has no clinical changes No change in energy level/ hair or skin/ edema and no tremor Lab Results  Component Value Date   TSH 4.93 06/19/2013

## 2013-06-24 NOTE — Assessment & Plan Note (Signed)
Enc pt to get vit D otc when she can afford it and take 2000 iu daily for bone and overall health

## 2013-06-24 NOTE — Progress Notes (Signed)
Pre-visit discussion using our clinic review tool. No additional management support is needed unless otherwise documented below in the visit note.  

## 2013-06-24 NOTE — Progress Notes (Signed)
Subjective:    Patient ID: Jean Tucker, female    DOB: Jan 18, 1951, 63 y.o.   MRN: [id number]  HPI Here for f/u of chronic medical problems  Wt is up 5 lb with bmi of 37 No exercise - cannot "do anything" with this arthritis No job and no money  Eating the same Not a lot of motivation  In process of getting soc security disability- will take 18 mo  ? If qualifies for medicaid    bp is up today  No cp or palpitations or headaches or edema  She took hctz - it helped her bp while she was on it  Was $- no insurance or job or $ BP Readings from Last 3 Encounters:  06/24/13 148/82  03/22/13 150/102  04/30/12 132/80     Hypothyroidism  Pt has no clinical changes No change in energy level/ hair or skin/ edema and no tremor Lab Results  Component Value Date   TSH 4.93 06/19/2013     Hyperlipidemia Lab Results  Component Value Date   CHOL 273* 06/19/2013   CHOL 265* 10/14/2011   CHOL 284* 11/26/2009   Lab Results  Component Value Date   HDL 26.20* 06/19/2013   HDL 23.70* 10/14/2011   HDL 42.30 11/26/2009   Lab Results  Component Value Date   LDLCALC 112* 10/12/2006   [place] 123* 06/12/2006   Lab Results  Component Value Date   TRIG 75.0 06/19/2013   TRIG 60.0 10/14/2011   TRIG 71.0 11/26/2009   Lab Results  Component Value Date   CHOLHDL 10 06/19/2013   CHOLHDL 11 10/14/2011   CHOLHDL 7 11/26/2009   Lab Results  Component Value Date   LDLDIRECT 225.7 06/19/2013   LDLDIRECT 223.4 10/14/2011   LDLDIRECT 230.8 11/26/2009   can't take statins Cannot afford medicines  She does try to stay away from fatty foods   Hx of OP Declines dexa  D level is 28 this time   Is taking aspirin per day  Not bothering her stomach  Cannot afford to take any chol med   Trying to get medicare social security   Patient Active Problem List   Diagnosis Date Noted  . Vitamin D deficiency disease 06/24/2013  . Obesity 06/24/2013  . Essential hypertension, benign 03/24/2013  . Arthralgia  04/04/2012  . Knee pain 04/04/2012  . Hand pain 04/04/2012  . Routine general medical examination at a health care facility 10/13/2011  . CERVICAL RADICULOPATHY, RIGHT 01/28/2010  . 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 Former smoker 01/02/2008  . HYPOTHYROIDISM 01/01/2008  . HYPERLIPIDEMIA 01/01/2008  . CORONARY ARTERY DISEASE 01/01/2008  . ALLERGIC RHINITIS 01/01/2008  . GERD 01/01/2008  . FIBROCYSTIC BREAST DISEASE 01/01/2008  . OSTEOPENIA 01/01/2008  . INSOMNIA 01/01/2008  . CONSTIPATION, HX OF 01/01/2008   Past Medical History  Diagnosis Date  . Allergy   . GERD (gastroesophageal reflux disease)   . CAD (coronary artery disease)   . Hyperlipidemia   . Thyroid disease   . Osteoporosis   . Rheumatoid arthritis    Past Surgical History  Procedure Laterality Date  . Abdominal hysterectomy  1983    partial,bleeding  . Rotator cuff repair      X 4  . Tendon release    . Carpal tunnel release      X 2, right   History  Substance Use Topics  . Smoking status: Former Smoker    Types: Cigarettes  . Smokeless tobacco:  Not on file  . Alcohol Use: No   Family History  Problem Relation Age of Onset  . Diabetes Mother   . Hypertension Mother   . Alcohol abuse Father    Allergies  Allergen Reactions  . Atorvastatin     REACTION: elevated LFT's  . Codeine     REACTION: swelling  . Simvastatin     REACTION: joint pain   Current Outpatient Prescriptions on File Prior to Visit  Medication Sig Dispense Refill  . aspirin 325 MG tablet Take 325 mg by mouth daily.       No current facility-administered medications on file prior to visit.    Review of Systems Review of Systems  Constitutional: Negative for fever, appetite change, fatigue and unexpected weight change.  Eyes: Negative for pain and visual disturbance.  Respiratory: Negative for cough and shortness of breath.   Cardiovascular: Negative for cp or palpitations    Gastrointestinal: Negative  for nausea, diarrhea and constipation.  Genitourinary: Negative for urgency and frequency.  Skin: Negative for pallor or rash   Neurological: Negative for weakness, light-headedness, numbness and headaches.  Hematological: Negative for adenopathy. Does not bruise/bleed easily.  MSK pos for chronic pain from RA- disabled  Psychiatric/Behavioral: Negative for dysphoric mood. The patient is not nervous/anxious.  pos for enormous stressors- financial- cannot afford to pay bills/ healthcare        Objective:   Physical Exam  Constitutional: She appears well-developed and well-nourished. No distress.  obese and well appearing   HENT:  Head: Normocephalic and atraumatic.  Mouth/Throat: Oropharynx is clear and moist.  Eyes: Conjunctivae and EOM are normal. Pupils are equal, round, and reactive to light. No scleral icterus.  Neck: Normal range of motion. Neck supple. No JVD present. Carotid bruit is not present. No thyromegaly present.  Cardiovascular: Normal rate, regular rhythm and intact distal pulses.  Exam reveals no gallop.   Pulmonary/Chest: Effort normal and breath sounds normal. No respiratory distress. She has no wheezes. She exhibits no tenderness.  Abdominal: Soft. Bowel sounds are normal. She exhibits no abdominal bruit.  Musculoskeletal: She exhibits no edema.  Joint changes of RA evident Gait is labored   Lymphadenopathy:    She has no cervical adenopathy.  Neurological: She is alert.  Skin: Skin is warm and dry. No rash noted. No erythema. No pallor.  Psychiatric:  Tearful at times when disc her RA and financial limitations          Assessment & Plan:

## 2013-06-24 NOTE — Patient Instructions (Signed)
Get back on the fluid pill/ blood pressure medicine  When you can get insurance - follow up so we can see how your blood pressure medicine is working  No change in thyroid dose  When you can afford it -get vitamin D over the counter - and take 2000 units daily  Watch cholesterol in diet the best you can   Fat and Cholesterol Control Diet Fat and cholesterol levels in your blood and organs are influenced by your diet. High levels of fat and cholesterol may lead to diseases of the heart, small and large blood vessels, gallbladder, liver, and pancreas. CONTROLLING FAT AND CHOLESTEROL WITH DIET Although exercise and lifestyle factors are important, your diet is key. That is because certain foods are known to raise cholesterol and others to lower it. The goal is to balance foods for their effect on cholesterol and more importantly, to replace saturated and trans fat with other types of fat, such as monounsaturated fat, polyunsaturated fat, and omega-3 fatty acids. On average, a person should consume no more than 15 to 17 g of saturated fat daily. Saturated and trans fats are considered "bad" fats, and they will raise LDL cholesterol. Saturated fats are primarily found in animal products such as meats, butter, and cream. However, that does not mean you need to give up all your favorite foods. Today, there are good tasting, low-fat, low-cholesterol substitutes for most of the things you like to eat. Choose low-fat or nonfat alternatives. Choose round or loin cuts of red meat. These types of cuts are lowest in fat and cholesterol. Chicken (without the skin), fish, veal, and ground [place] breast are great choices. Eliminate fatty meats, such as hot dogs and salami. Even shellfish have little or no saturated fat. Have a 3 oz (85 g) portion when you eat lean meat, poultry, or fish. Trans fats are also called "partially hydrogenated oils." They are oils that have been scientifically manipulated so that they are solid  at room temperature resulting in a longer shelf life and improved taste and texture of foods in which they are added. Trans fats are found in stick margarine, some tub margarines, cookies, crackers, and baked goods.  When baking and cooking, oils are a great substitute for butter. The monounsaturated oils are especially beneficial since it is believed they lower LDL and raise HDL. The oils you should avoid entirely are saturated tropical oils, such as coconut and palm.  Remember to eat a lot from food groups that are naturally free of saturated and trans fat, including fish, fruit, vegetables, beans, grains (barley, rice, couscous, bulgur wheat), and pasta (without cream sauces).  IDENTIFYING FOODS THAT LOWER FAT AND CHOLESTEROL  Soluble fiber may lower your cholesterol. This type of fiber is found in fruits such as apples, vegetables such as broccoli, potatoes, and carrots, legumes such as beans, peas, and lentils, and grains such as barley. Foods fortified with plant sterols (phytosterol) may also lower cholesterol. You should eat at least 2 g per day of these foods for a cholesterol lowering effect.  Read package labels to identify low-saturated fats, trans fat free, and low-fat foods at the supermarket. Select cheeses that have only 2 to 3 g saturated fat per ounce. Use a heart-healthy tub margarine that is free of trans fats or partially hydrogenated oil. When buying baked goods (cookies, crackers), avoid partially hydrogenated oils. Breads and muffins should be made from whole grains (whole-wheat or whole oat flour, instead of "flour" or "enriched flour"). Buy non-creamy  canned soups with reduced salt and no added fats.  FOOD PREPARATION TECHNIQUES  Never deep-fry. If you must fry, either stir-fry, which uses very little fat, or use non-stick cooking sprays. When possible, broil, bake, or roast meats, and steam vegetables. Instead of putting butter or margarine on vegetables, use lemon and herbs,  applesauce, and cinnamon (for squash and sweet potatoes). Use nonfat yogurt, salsa, and low-fat dressings for salads.  LOW-SATURATED FAT / LOW-FAT FOOD SUBSTITUTES Meats / Saturated Fat (g)  Avoid: Steak, marbled (3 oz/85 g) / 11 g  Choose: Steak, lean (3 oz/85 g) / 4 g  Avoid: Hamburger (3 oz/85 g) / 7 g  Choose: Hamburger, lean (3 oz/85 g) / 5 g  Avoid: Ham (3 oz/85 g) / 6 g  Choose: Ham, lean cut (3 oz/85 g) / 2.4 g  Avoid: Chicken, with skin, dark meat (3 oz/85 g) / 4 g  Choose: Chicken, skin removed, dark meat (3 oz/85 g) / 2 g  Avoid: Chicken, with skin, light meat (3 oz/85 g) / 2.5 g  Choose: Chicken, skin removed, light meat (3 oz/85 g) / 1 g Dairy / Saturated Fat (g)  Avoid: Whole milk (1 cup) / 5 g  Choose: Low-fat milk, 2% (1 cup) / 3 g  Choose: Low-fat milk, 1% (1 cup) / 1.5 g  Choose: Skim milk (1 cup) / 0.3 g  Avoid: Hard cheese (1 oz/28 g) / 6 g  Choose: Skim milk cheese (1 oz/28 g) / 2 to 3 g  Avoid: Cottage cheese, 4% fat (1 cup) / 6.5 g  Choose: Low-fat cottage cheese, 1% fat (1 cup) / 1.5 g  Avoid: Ice cream (1 cup) / 9 g  Choose: Sherbet (1 cup) / 2.5 g  Choose: Nonfat frozen yogurt (1 cup) / 0.3 g  Choose: Frozen fruit bar / trace  Avoid: Whipped cream (1 tbs) / 3.5 g  Choose: Nondairy whipped topping (1 tbs) / 1 g Condiments / Saturated Fat (g)  Avoid: Mayonnaise (1 tbs) / 2 g  Choose: Low-fat mayonnaise (1 tbs) / 1 g  Avoid: Butter (1 tbs) / 7 g  Choose: Extra light margarine (1 tbs) / 1 g  Avoid: Coconut oil (1 tbs) / 11.8 g  Choose: Olive oil (1 tbs) / 1.8 g  Choose: Corn oil (1 tbs) / 1.7 g  Choose: Safflower oil (1 tbs) / 1.2 g  Choose: Sunflower oil (1 tbs) / 1.4 g  Choose: Soybean oil (1 tbs) / 2.4 g  Choose: Canola oil (1 tbs) / 1 g Document Released: 05/09/2005 Document Revised: 09/03/2012 Document Reviewed: 10/28/2010 Exit[employer] Patient Information 2014 [place], [place].

## 2013-06-24 NOTE — Assessment & Plan Note (Signed)
Discussed how this problem influences overall health and the risks it imposes  Reviewed plan for weight loss with lower calorie diet (via better food choices and also portion control or program like weight watchers) and exercise building up to or more than 30 minutes 5 days per week including some aerobic activity   Pt states that with financial burden and RA she cannot make the effort right now

## 2013-06-24 NOTE — Assessment & Plan Note (Signed)
Disc goals for lipids and reasons to control them Rev labs with pt Rev low sat fat diet in detail Pt intol of statins and cannot afford any med at this time  Handout given on diet

## 2013-06-24 NOTE — Assessment & Plan Note (Signed)
Will start back on hctz when she can afford to get it  Pt states bp is good outside of office on this bp is up off med Disc lifstyle change with low sodium diet and exercise  (pt unable to exercise due to RA currently)  Per pt - $ issues keep her from eating well

## 2013-08-27 ENCOUNTER — Other Ambulatory Visit: Payer: Self-pay | Admitting: Family Medicine

## 2013-09-17 ENCOUNTER — Telehealth: Payer: Self-pay

## 2013-09-17 NOTE — Telephone Encounter (Signed)
Pt wants to know if HCTZ rx written on 06/24/13 is still valid to take to pharmacy. Advised pt yes she can take to pharmacy of her choice. Pt voiced understanding.

## 2013-09-18 ENCOUNTER — Other Ambulatory Visit: Payer: Self-pay

## 2013-09-18 MED ORDER — HYDROCHLOROTHIAZIDE 12.5 MG PO TABS: 12.5000 mg | ORAL_TABLET | Freq: Every day | ORAL | Status: DC

## 2013-09-18 NOTE — Telephone Encounter (Signed)
Pt brought back 06/24/2013 HCTZ rx and rx was sent for shredding; pt request refill HCT[facility]; advised pt done.

## 2013-12-24 ENCOUNTER — Telehealth: Payer: Self-pay | Admitting: Family Medicine

## 2013-12-24 NOTE — Telephone Encounter (Signed)
Patient Information:  Caller Name: Jean Tucker  Phone: [phone]  Patient: Jean Tucker, Jean Tucker  Gender: Female  DOB: 27-Sep-1950  Age: 63 Years  PCP: [facility]  Office Follow Up:  Does the office need to follow up with this patient?: No  Instructions For The Office: N/A  RN Note:  Advised that she should be seen sooner that 12/31/13 but she states that she only wants to see Dr Glori Bickers and has tolerated this rash for 3 months. Was advised by scheduler to call back in am to see if they have any cancellations. Home care advice given.  Symptoms  Reason For Call & Symptoms: Calling about medication for RA causes an itchy rash in groin and under belly, and has a boil on butt cheek. Has had rash and boils since starting Leflunomide 3 months ago.Tried antifungal cream and powder and cortisone cream without effect. Has an appt scheduled on 12/31/13 with Dr Glori Bickers. Had bloodwork done by rheumatologist on 12/18/13. Was transferred to triage nurse to see if she needed to be seen in the office sooner. Pt only wants to see Dr Glori Bickers and her soonest appt is 12/31/13.  Reviewed Health History In EMR: Yes  Reviewed Medications In EMR: Yes  Reviewed Allergies In EMR: Yes  Reviewed Surgeries / Procedures: Yes  Date of Onset of Symptoms: 09/20/2013  Guideline(s) Used:  Rash or Redness - Localized  Disposition Per Guideline:   See Today or Tomorrow in Office  Reason For Disposition Reached:   Severe local itching persists after 2 days of steroid cream and antihistamines  Advice Given:  Avoid Soap:  Wash the area once thoroughly with soap to remove any remaining irritants. Thereafter avoid soaps to this area. Cleanse the area when needed with warm water.  Apply Cold to the Area:  Apply or soak in cold water for 20 minutes every 3 to 4 hours to reduce itching or pain.  Hydrocortisone Cream for Itching:   Available over-the-counter in [place] as 0.5% and 1% cream.  Avoid Scratching:  Try  not to scratch. Cut your fingernails short.  Call Back If:  Rash spreads or becomes worse  You become worse.  Patient Will Follow Care Advice:  YES

## 2013-12-24 NOTE — Telephone Encounter (Signed)
I will see her then  If condition urgently worsens- get to urgent care or ER Also - her Rheumatologist needs to know about that - perhaps she needs a change in med?

## 2013-12-24 NOTE — Telephone Encounter (Signed)
Left detailed message on voicemail.  

## 2013-12-25 ENCOUNTER — Ambulatory Visit (INDEPENDENT_AMBULATORY_CARE_PROVIDER_SITE_OTHER): Payer: BC Managed Care – PPO | Admitting: Family Medicine

## 2013-12-25 ENCOUNTER — Encounter: Payer: Self-pay | Admitting: Family Medicine

## 2013-12-25 VITALS — BP 150/96 | HR 66 | Temp 98.4°F | Ht 62.0 in | Wt 189.2 lb

## 2013-12-25 DIAGNOSIS — B372 Candidiasis of skin and nail: Secondary | ICD-10-CM

## 2013-12-25 MED ORDER — CLOTRIMAZOLE-BETAMETHASONE 1-0.05 % EX CREA: 1.0000 "application " | TOPICAL_CREAM | Freq: Two times a day (BID) | CUTANEOUS | Status: DC

## 2013-12-25 NOTE — Progress Notes (Signed)
Pre visit review using our clinic review tool, if applicable. No additional management support is needed unless otherwise documented below in the visit note. 

## 2013-12-25 NOTE — Progress Notes (Signed)
Subjective:    Patient ID: Jean Tucker, female    DOB: [birth year], 63 y.o.   MRN: [id number]  HPI Here with rash in groin Itchy  Has had it for over a month - ? Side eff to [place]    Was on humera- and had to stop it due to bp going up  Then methotexate caused inc LFT Started on ARAVA  (rheumatology thought it was a side eff to low immune status)  Patient Active Problem List   Diagnosis Date Noted  . Vitamin D deficiency disease 06/24/2013  . Obesity 06/24/2013  . Essential hypertension, benign 03/24/2013  . Arthralgia 04/04/2012  . Knee pain 04/04/2012  . Hand pain 04/04/2012  . Routine general medical examination at a health care facility 10/13/2011  . CERVICAL RADICULOPATHY, RIGHT 01/28/2010  . 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 Former smoker 01/02/2008  . HYPOTHYROIDISM 01/01/2008  . HYPERLIPIDEMIA 01/01/2008  . CORONARY ARTERY DISEASE 01/01/2008  . ALLERGIC RHINITIS 01/01/2008  . GERD 01/01/2008  . FIBROCYSTIC BREAST DISEASE 01/01/2008  . OSTEOPENIA 01/01/2008  . INSOMNIA 01/01/2008  . CONSTIPATION, HX OF 01/01/2008   Past Medical History  Diagnosis Date  . Allergy   . GERD (gastroesophageal reflux disease)   . CAD (coronary artery disease)   . Hyperlipidemia   . Thyroid disease   . Osteoporosis   . Rheumatoid arthritis    Past Surgical History  Procedure Laterality Date  . Abdominal hysterectomy  1983    partial,bleeding  . Rotator cuff repair      X 4  . Tendon release    . Carpal tunnel release      X 2, right   History  Substance Use Topics  . Smoking status: Former Smoker    Types: Cigarettes  . Smokeless tobacco: Not on file  . Alcohol Use: No   Family History  Problem Relation Age of Onset  . Diabetes Mother   . Hypertension Mother   . Alcohol abuse Father    Allergies  Allergen Reactions  . Atorvastatin     REACTION: elevated LFT's  . Codeine     REACTION: swelling  . Simvastatin     REACTION: joint pain     Current Outpatient Prescriptions on File Prior to Visit  Medication Sig Dispense Refill  . aspirin 325 MG tablet Take 325 mg by mouth daily.      . hydrochlorothiazide (HYDRODIURIL) 12.5 MG tablet Take 1 tablet (12.5 mg total) by mouth daily.  90 tablet  3  . levothyroxine (SYNTHROID, LEVOTHROID) 50 MCG tablet TAKE 1 TABLET DAILY  90 tablet  1   No current facility-administered medications on file prior to visit.       Review of Systems Review of Systems  Constitutional: Negative for fever, appetite change, fatigue and unexpected weight change.  Eyes: Negative for pain and visual disturbance.  Respiratory: Negative for cough and shortness of breath.   Cardiovascular: Negative for cp or palpitations    Gastrointestinal: Negative for nausea, diarrhea and constipation.  Genitourinary: Negative for urgency and frequency.  Skin: Negative for pallor and pos for itchy rash MSK pos for arthritis/painful joints  Neurological: Negative for weakness, light-headedness, numbness and headaches.  Hematological: Negative for adenopathy. Does not bruise/bleed easily.  Psychiatric/Behavioral: Negative for dysphoric mood. The patient is not nervous/anxious.         Objective:   Physical Exam  Constitutional: She appears well-developed and well-nourished. No distress.  HENT:  Head:  Normocephalic and atraumatic.  Mouth/Throat: Oropharynx is clear and moist.  Eyes: Conjunctivae and EOM are normal. Pupils are equal, round, and reactive to light. Right eye exhibits no discharge. Left eye exhibits no discharge.  Neck: Normal range of motion. Neck supple.  Cardiovascular: Normal rate and regular rhythm.   Musculoskeletal: She exhibits no edema and no tenderness.  Lymphadenopathy:    She has no cervical adenopathy.  Neurological: She is alert.  Skin: Skin is warm and dry. Rash noted. There is erythema. No pallor.  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          Assessment & Plan:   Problem List Items Addressed This Visit     Musculoskeletal and Integument   Candidal intertrigo - Primary     In pt who is on biologic agent for autoimmune jt dz  Will stop nystatin and try lotrisone - if that does not work better she will update Disc hygiene and need to stay dry in affected areas      Relevant Medications      miconazole (MICOTIN) 2 % powder      LOTRISONE 1-0.05 % EX CREA

## 2013-12-25 NOTE — Patient Instructions (Signed)
Stop the nystatin cream Try this lotrisone cream - it has antifungal properties to help yeast and also has a bit of topical steroid to calm itching  Update me if this does not improve things Keep the areas clean and dry  Use antibacterial soap and water to cleanse

## 2013-12-25 NOTE — Assessment & Plan Note (Signed)
In pt who is on biologic agent for autoimmune jt dz  Will stop nystatin and try lotrisone - if that does not work better she will update Disc hygiene and need to stay dry in affected areas

## 2013-12-31 ENCOUNTER — Ambulatory Visit: Payer: BC Managed Care – PPO | Admitting: Family Medicine

## 2014-05-01 ENCOUNTER — Other Ambulatory Visit: Payer: Self-pay | Admitting: *Deleted

## 2014-05-01 MED ORDER — CLOTRIMAZOLE-BETAMETHASONE 1-0.05 % EX CREA: 1.0000 "application " | TOPICAL_CREAM | Freq: Two times a day (BID) | CUTANEOUS | Status: DC

## 2014-05-01 NOTE — Telephone Encounter (Signed)
Fax refill request, no recent/future appt., please advise  

## 2014-05-01 NOTE — Telephone Encounter (Signed)
Will refill electronically  

## 2014-06-20 ENCOUNTER — Ambulatory Visit (INDEPENDENT_AMBULATORY_CARE_PROVIDER_SITE_OTHER): Payer: BLUE CROSS/BLUE SHIELD | Admitting: Family Medicine

## 2014-06-20 ENCOUNTER — Encounter: Payer: Self-pay | Admitting: Family Medicine

## 2014-06-20 ENCOUNTER — Ambulatory Visit (INDEPENDENT_AMBULATORY_CARE_PROVIDER_SITE_OTHER)
Admission: RE | Admit: 2014-06-20 | Discharge: 2014-06-20 | Disposition: A | Discharge status: Home / Self Care | Payer: BLUE CROSS/BLUE SHIELD | Source: Ambulatory Visit | Attending: Family Medicine | Admitting: Family Medicine

## 2014-06-20 VITALS — BP 152/92 | HR 64 | Temp 98.1°F | Ht 62.0 in | Wt 183.4 lb

## 2014-06-20 DIAGNOSIS — I10 Essential (primary) hypertension: Secondary | ICD-10-CM

## 2014-06-20 DIAGNOSIS — R739 Hyperglycemia, unspecified: Secondary | ICD-10-CM

## 2014-06-20 DIAGNOSIS — R059 Cough, unspecified: Secondary | ICD-10-CM | POA: Insufficient documentation

## 2014-06-20 DIAGNOSIS — R05 Cough: Secondary | ICD-10-CM | POA: Insufficient documentation

## 2014-06-20 LAB — CBC WITH DIFFERENTIAL/PLATELET
BASOS PCT: 0.6 % (ref 0.0–3.0)
Basophils Absolute: 0.1 10*3/uL (ref 0.0–0.1)
EOS ABS: 0.1 10*3/uL (ref 0.0–0.7)
EOS PCT: 1.8 % (ref 0.0–5.0)
HCT: 31.5 % — ABNORMAL LOW (ref 36.0–46.0)
Hemoglobin: 10.7 g/dL — ABNORMAL LOW (ref 12.0–15.0)
LYMPHS ABS: 2.2 10*3/uL (ref 0.7–4.0)
Lymphocytes Relative: 27.3 % (ref 12.0–46.0)
MCHC: 34 g/dL (ref 30.0–36.0)
MCV: 94 fl (ref 78.0–100.0)
Monocytes Absolute: 0.9 10*3/uL (ref 0.1–1.0)
Monocytes Relative: 10.6 % (ref 3.0–12.0)
NEUTROS ABS: 4.8 10*3/uL (ref 1.4–7.7)
Neutrophils Relative %: 59.7 % (ref 43.0–77.0)
PLATELETS: 257 10*3/uL (ref 150.0–400.0)
RBC: 3.35 Mil/uL — AB (ref 3.87–5.11)
RDW: 15.7 % — AB (ref 11.5–15.5)
WBC: 8 10*3/uL (ref 4.0–10.5)

## 2014-06-20 LAB — LIPID PANEL
CHOL/HDL RATIO: 8
CHOLESTEROL: 219 mg/dL — AB (ref 0–200)
HDL: 26.2 mg/dL — ABNORMAL LOW (ref >39.00)
LDL Cholesterol: 175 mg/dL — ABNORMAL HIGH (ref 0–99)
NONHDL: 192.8
TRIGLYCERIDES: 88 mg/dL (ref 0.0–149.0)
VLDL: 17.6 mg/dL (ref 0.0–40.0)

## 2014-06-20 LAB — COMPREHENSIVE METABOLIC PANEL
ALT: 36 U/L — ABNORMAL HIGH (ref 0–35)
AST: 81 U/L — ABNORMAL HIGH (ref 0–37)
Albumin: 2.9 g/dL — ABNORMAL LOW (ref 3.5–5.2)
Alkaline Phosphatase: 81 U/L (ref 39–117)
BUN: 12 mg/dL (ref 6–23)
CALCIUM: 9.4 mg/dL (ref 8.4–10.5)
CHLORIDE: 102 meq/L (ref 96–112)
CO2: 25 mEq/L (ref 19–32)
CREATININE: 0.74 mg/dL (ref 0.40–1.20)
GFR: 84.67 mL/min (ref >60.00)
Glucose, Bld: 138 mg/dL — ABNORMAL HIGH (ref 70–99)
Potassium: 4.2 mEq/L (ref 3.5–5.1)
SODIUM: 131 meq/L — AB (ref 135–145)
Total Bilirubin: 0.4 mg/dL (ref 0.2–1.2)
Total Protein: 10.7 g/dL — ABNORMAL HIGH (ref 6.0–8.3)

## 2014-06-20 LAB — TSH: TSH: 11.69 u[IU]/mL — AB (ref 0.35–4.50)

## 2014-06-20 LAB — HEMOGLOBIN A1C: HEMOGLOBIN A1C: 6.1 % (ref 4.6–6.5)

## 2014-06-20 MED ORDER — BENZONATATE 200 MG PO CAPS: 200.0000 mg | ORAL_CAPSULE | Freq: Three times a day (TID) | ORAL | Status: DC | PRN

## 2014-06-20 MED ORDER — HYDROCHLOROTHIAZIDE 25 MG PO TABS: 25.0000 mg | ORAL_TABLET | Freq: Every day | ORAL | Status: DC

## 2014-06-20 NOTE — Progress Notes (Signed)
Pre visit review using our clinic review tool, if applicable. No additional management support is needed unless otherwise documented below in the visit note. 

## 2014-06-20 NOTE — Patient Instructions (Signed)
Lab today  For blood pressure increase your hctz to 25 mg once daily  Try tessalon for cough  Try plain zyrtec 10 mg at night for cough and to help sleep Chest xray today   Labs today  Follow up in about 2 weeks

## 2014-06-20 NOTE — Progress Notes (Signed)
Subjective:    Patient ID: Jean Tucker, female    DOB: 05-07-1951, 64 y.o.   MRN: [id number]  HPI Here with a persistent dry cough  Worse if she moves around a lot - worse (ex carrying groceries) - and she will also get pain in her chest (does not think it is her heart)  No gerd symptoms at all  No heartburn or stomach symptoms  Has lost 6 lb bmi 33 Does not know why  Max 203 lb   (now 78)- caring for her mother - she died in 2022-12-04 / grief has been tough  bp is up today  Was quite high at home one time - runs high for days at a time    Has not had a cold   Has post nasal drip and ears clog up a bit (esp if she occ blows her nose)  No facial pain    She tried delsym -not working  Tried cough drops - not working  In the past Gibbon worked well   Quit smoking - 5 years ago  Patient Active Problem List   Diagnosis Date Noted  . Candidal intertrigo 12/25/2013  . Vitamin D deficiency disease 06/24/2013  . Obesity 06/24/2013  . Essential hypertension, benign 03/24/2013  . Arthralgia 04/04/2012  . Knee pain 04/04/2012  . Hand pain 04/04/2012  . Routine general medical examination at a health care facility 10/13/2011  . CERVICAL RADICULOPATHY, RIGHT 01/28/2010  . 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 Former smoker 01/02/2008  . HYPOTHYROIDISM 01/01/2008  . HYPERLIPIDEMIA 01/01/2008  . CORONARY ARTERY DISEASE 01/01/2008  . ALLERGIC RHINITIS 01/01/2008  . GERD 01/01/2008  . FIBROCYSTIC BREAST DISEASE 01/01/2008  . OSTEOPENIA 01/01/2008  . INSOMNIA 01/01/2008  . CONSTIPATION, HX OF 01/01/2008   Past Medical History  Diagnosis Date  . Allergy   . GERD (gastroesophageal reflux disease)   . CAD (coronary artery disease)   . Hyperlipidemia   . Thyroid disease   . Osteoporosis   . Rheumatoid arthritis    Past Surgical History  Procedure Laterality Date  . Abdominal hysterectomy  1983    partial,bleeding  . Rotator cuff repair      X 4  .  Tendon release    . Carpal tunnel release      X 2, right   History  Substance Use Topics  . Smoking status: Former Smoker    Types: Cigarettes  . Smokeless tobacco: Not on file  . Alcohol Use: No   Family History  Problem Relation Age of Onset  . Diabetes Mother   . Hypertension Mother   . Alcohol abuse Father    Allergies  Allergen Reactions  . Atorvastatin     REACTION: elevated LFT's  . Codeine     REACTION: swelling  . Simvastatin     REACTION: joint pain   Current Outpatient Prescriptions on File Prior to Visit  Medication Sig Dispense Refill  . aspirin 325 MG tablet Take 325 mg by mouth daily.    . clotrimazole-betamethasone (LOTRISONE) cream Apply 1 application topically 2 (two) times daily. To affected areas 135 g 0  . hydrochlorothiazide (HYDRODIURIL) 12.5 MG tablet Take 1 tablet (12.5 mg total) by mouth daily. 90 tablet 3  . leflunomide (ARAVA) 10 MG tablet Take 10 mg by mouth daily.    Marland Kitchen levothyroxine (SYNTHROID, LEVOTHROID) 50 MCG tablet TAKE 1 TABLET DAILY 90 tablet 1   No current facility-administered medications on file prior  to visit.     Review of Systems Review of Systems  Constitutional: Negative for fever, appetite change, fatigue and unexpected weight change.  Eyes: Negative for pain and visual disturbance.  ENT neg for nasal symptoms or st  Respiratory: Negative for wheeze and shortness of breath.   Cardiovascular: Negative for cp or palpitations    Gastrointestinal: Negative for nausea, diarrhea and constipation.  Genitourinary: Negative for urgency and frequency.  Skin: Negative for pallor or rash   Neurological: Negative for weakness, light-headedness, numbness and headaches.  Hematological: Negative for adenopathy. Does not bruise/bleed easily.  Psychiatric/Behavioral: Negative for dysphoric mood. The patient is not nervous/anxious.         Objective:   Physical Exam  Constitutional: She appears well-developed and well-nourished. No  distress.  obese and well appearing   HENT:  Head: Normocephalic and atraumatic.  Right Ear: External ear normal.  Left Ear: External ear normal.  Nose: Nose normal.  Mouth/Throat: Oropharynx is clear and moist.  Throat is clear Nares are boggy No sinus tenderness  Eyes: Conjunctivae and EOM are normal. Pupils are equal, round, and reactive to light. Right eye exhibits no discharge. Left eye exhibits no discharge. No scleral icterus.  Neck: Normal range of motion. Neck supple. No JVD present. Carotid bruit is not present. No thyromegaly present.  Cardiovascular: Normal rate, regular rhythm, normal heart sounds and intact distal pulses.  Exam reveals no gallop.   Pulmonary/Chest: Effort normal and breath sounds normal. No respiratory distress. She has no wheezes. She has no rales.  Abdominal: Soft. Bowel sounds are normal. She exhibits no distension, no abdominal bruit and no mass. There is no tenderness.  Musculoskeletal: She exhibits no edema or tenderness.  Lymphadenopathy:    She has no cervical adenopathy.  Neurological: She is alert. She has normal reflexes. No cranial nerve deficit. She exhibits normal muscle tone. Coordination normal.  Skin: Skin is warm and dry. No rash noted. No erythema. No pallor.  Psychiatric: She has a normal mood and affect.          Assessment & Plan:   Problem List Items Addressed This Visit      Cardiovascular and Mediastinum   Essential hypertension, benign    Not opt controlled  Inc HCTZ to 25 mg daily  Disc lifestyle habits /DASH diet  F/u planned  Lab today      Relevant Medications   hydrochlorothiazide tablet   Other Relevant Orders   CBC with Differential/Platelet (Completed)   Comprehensive metabolic panel (Completed)   TSH (Completed)   Lipid panel (Completed)     Other   Cough - Primary    Ongoing dry cough No GERD symptoms Trial of zyrtec for post nasal drip Tessalon for symptoms  cxr today  F/u planned        Relevant Orders   DG Chest 2 View (Completed)   Hyperglycemia    A1C today Low glycemic diet       Relevant Orders   Hemoglobin A1c (Completed)

## 2014-06-21 NOTE — Assessment & Plan Note (Addendum)
Not opt controlled  Inc HCTZ to 25 mg daily  Disc lifestyle habits /DASH diet  F/u planned  Lab today

## 2014-06-21 NOTE — Assessment & Plan Note (Signed)
A1C today Low glycemic diet

## 2014-06-21 NOTE — Assessment & Plan Note (Signed)
Ongoing dry cough No GERD symptoms Trial of zyrtec for post nasal drip Tessalon for symptoms  cxr today  F/u planned

## 2014-06-24 ENCOUNTER — Encounter: Payer: Self-pay | Admitting: *Deleted

## 2014-07-08 ENCOUNTER — Ambulatory Visit (INDEPENDENT_AMBULATORY_CARE_PROVIDER_SITE_OTHER): Payer: BLUE CROSS/BLUE SHIELD | Admitting: Family Medicine

## 2014-07-08 ENCOUNTER — Encounter: Payer: Self-pay | Admitting: Family Medicine

## 2014-07-08 VITALS — BP 140/80 | HR 73 | Temp 98.2°F | Ht 62.0 in | Wt 182.8 lb

## 2014-07-08 DIAGNOSIS — M069 Rheumatoid arthritis, unspecified: Secondary | ICD-10-CM

## 2014-07-08 DIAGNOSIS — E871 Hypo-osmolality and hyponatremia: Secondary | ICD-10-CM | POA: Insufficient documentation

## 2014-07-08 DIAGNOSIS — I10 Essential (primary) hypertension: Secondary | ICD-10-CM

## 2014-07-08 DIAGNOSIS — R739 Hyperglycemia, unspecified: Secondary | ICD-10-CM

## 2014-07-08 DIAGNOSIS — R7401 Elevation of levels of liver transaminase levels: Secondary | ICD-10-CM | POA: Insufficient documentation

## 2014-07-08 DIAGNOSIS — R0981 Nasal congestion: Secondary | ICD-10-CM

## 2014-07-08 DIAGNOSIS — R740 Nonspecific elevation of levels of transaminase and lactic acid dehydrogenase [LDH]: Secondary | ICD-10-CM

## 2014-07-08 DIAGNOSIS — E039 Hypothyroidism, unspecified: Secondary | ICD-10-CM

## 2014-07-08 DIAGNOSIS — D649 Anemia, unspecified: Secondary | ICD-10-CM | POA: Insufficient documentation

## 2014-07-08 MED ORDER — LEVOTHYROXINE SODIUM 75 MCG PO TABS: 75.0000 ug | ORAL_TABLET | Freq: Every day | ORAL | Status: DC

## 2014-07-08 NOTE — Patient Instructions (Signed)
Stop your Arava  Take levothyroxine 75 mcg daily (I went up on that)- sent to local pharmacy (you can take 1 1/2 of what you have first)  Watch sugar in diet Really watch fat in diet  Blood pressure is improved For sinuses- look for a netti pot to help flush them  Schedule non fasting lab in 2 weeks and then follow up

## 2014-07-08 NOTE — Progress Notes (Signed)
Pre visit review using our clinic review tool, if applicable. No additional management support is needed unless otherwise documented below in the visit note. 

## 2014-07-08 NOTE — Progress Notes (Signed)
Subjective:    Patient ID: Jean Tucker, female    DOB: 1951-04-17, 64 y.o.   MRN: [id number]  HPI Here for f/u of HTN and chronic medical problems   bp is stable today  No cp or palpitations or headaches or edema  No side effects to medicines  BP Readings from Last 3 Encounters:  07/08/14 140/80  06/20/14 152/92  12/25/13 150/96    Last visit inc hctz to 25 -no problems with that   Low na  ? Due to this or thyroid Lab Results  Component Value Date   TSH 11.69* 06/20/2014   on 50 mcg   Liver tests Lab Results  Component Value Date   ALT 36* 06/20/2014   AST 81* 06/20/2014   ALKPHOS 81 06/20/2014   BILITOT 0.4 06/20/2014   her rheumatologist last told her to stop nsaid and tylenol -no longer on these  ? From [place]  Also low albumin   She sees Dr Amil Amen and Leafy Kindle for rheumatology   Also anemic  Lab Results  Component Value Date   WBC 8.0 06/20/2014   HGB 10.7* 06/20/2014   HCT 31.5* 06/20/2014   MCV 94.0 06/20/2014   PLT 257.0 06/20/2014   this is significant  No abd pain or dark stool  Feels ok   Hyperglycemia Lab Results  Component Value Date   HGBA1C 6.1 06/20/2014  this can also be a side eff of her Arava She does not eat a lot of sweets    She does not plan to go back to rheumatology  Still not sleeping - she does briefly and then wakes up  This is acute on chronic  Does not feel depressed or anxious  Cough is better - after about 5 days/now much better   Patient Active Problem List   Diagnosis Date Noted  . Hyponatremia 07/08/2014  . Elevated transaminase level 07/08/2014  . Anemia 07/08/2014  . Sinus congestion 07/08/2014  . Hyperglycemia 06/20/2014  . Cough 06/20/2014  . Candidal intertrigo 12/25/2013  . Vitamin D deficiency disease 06/24/2013  . Obesity 06/24/2013  . Essential hypertension, benign 03/24/2013  . Arthralgia 04/04/2012  . Knee pain 04/04/2012  . Hand pain 04/04/2012  . Routine general medical  examination at a health care facility 10/13/2011  . CERVICAL RADICULOPATHY, RIGHT 01/28/2010  . 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 Former smoker 01/02/2008  . Hypothyroidism 01/01/2008  . HYPERLIPIDEMIA 01/01/2008  . CORONARY ARTERY DISEASE 01/01/2008  . ALLERGIC RHINITIS 01/01/2008  . GERD 01/01/2008  . FIBROCYSTIC BREAST DISEASE 01/01/2008  . OSTEOPENIA 01/01/2008  . INSOMNIA 01/01/2008  . CONSTIPATION, HX OF 01/01/2008   Past Medical History  Diagnosis Date  . Allergy   . GERD (gastroesophageal reflux disease)   . CAD (coronary artery disease)   . Hyperlipidemia   . Thyroid disease   . Osteoporosis   . Rheumatoid arthritis    Past Surgical History  Procedure Laterality Date  . Abdominal hysterectomy  1983    partial,bleeding  . Rotator cuff repair      X 4  . Tendon release    . Carpal tunnel release      X 2, right   History  Substance Use Topics  . Smoking status: Former Smoker    Types: Cigarettes  . Smokeless tobacco: Not on file  . Alcohol Use: No   Family History  Problem Relation Age of Onset  . Diabetes Mother   . Hypertension Mother   .  Alcohol abuse Father   . Cancer Mother     ovarian   Allergies  Allergen Reactions  . Arava [Leflunomide] Other (See Comments)    Elevated liver tests  Dec albumin Anemia   . Atorvastatin     REACTION: elevated LFT's  . Codeine     REACTION: swelling  . Humira [Adalimumab] Other (See Comments)    Increased bp  . Methotrexate Derivatives Other (See Comments)    Liver tests elevated   . Simvastatin     REACTION: joint pain   Current Outpatient Prescriptions on File Prior to Visit  Medication Sig Dispense Refill  . aspirin 325 MG tablet Take 325 mg by mouth daily.    . benzonatate (TESSALON) 200 MG capsule Take 1 capsule (200 mg total) by mouth 3 (three) times daily as needed for cough (swallow whole). 30 capsule 1  . clotrimazole-betamethasone (LOTRISONE) cream Apply 1 application  topically 2 (two) times daily. To affected areas 135 g 0  . hydrochlorothiazide (HYDRODIURIL) 25 MG tablet Take 1 tablet (25 mg total) by mouth daily. 90 tablet 3   No current facility-administered medications on file prior to visit.     Review of Systems Review of Systems  Constitutional: Negative for fever, appetite change,  and unexpected weight change.  Eyes: Negative for pain and visual disturbance.  Respiratory: Negative for cough and shortness of breath.   Cardiovascular: Negative for cp or palpitations    Gastrointestinal: Negative for nausea, diarrhea and constipation.  Genitourinary: Negative for urgency and frequency.  Skin: Negative for pallor or rash   MSK pos for joint pain from RA Neurological: Negative for weakness, light-headedness, numbness and headaches.  Hematological: Negative for adenopathy. Does not bruise/bleed easily.  Psychiatric/Behavioral: Negative for dysphoric mood. The patient is not nervous/anxious.         Objective:   Physical Exam  Constitutional: She appears well-developed and well-nourished. No distress.  obese and well appearing   HENT:  Head: Normocephalic and atraumatic.  Mouth/Throat: Oropharynx is clear and moist.  Eyes: Conjunctivae and EOM are normal. Pupils are equal, round, and reactive to light. No scleral icterus.  Neck: Normal range of motion. Neck supple. No JVD present. Carotid bruit is not present. No thyromegaly present.  Cardiovascular: Normal rate, regular rhythm, normal heart sounds and intact distal pulses.  Exam reveals no gallop.   Pulmonary/Chest: Effort normal and breath sounds normal. No respiratory distress. She has no wheezes. She has no rales.  Abdominal: Soft. Bowel sounds are normal. She exhibits no distension and no mass. There is no tenderness. There is no rebound and no guarding.  No HSM noted  Musculoskeletal: She exhibits no edema.  Lymphadenopathy:    She has no cervical adenopathy.  Neurological: She is  alert. She has normal reflexes. No cranial nerve deficit. She exhibits normal muscle tone. Coordination normal.  Skin: Skin is warm and dry. No rash noted. No erythema. No pallor.  Fair- no pallor Nl skin turgor and cap refill time No jaundice   Psychiatric: She has a normal mood and affect.          Assessment & Plan:   Problem List Items Addressed This Visit      Cardiovascular and Mediastinum   Essential hypertension, benign - Primary    bp in fair control at this time  BP Readings from Last 1 Encounters:  07/08/14 140/80   No changes needed Disc lifstyle change with low sodium diet and exercise  This continues to  improve - would like to eventually get systolic under 426 Disc DASH diet        Respiratory   Sinus congestion    Recommend nasal saline/ breathing steam/ flonase otc (if tolerated) Will re check at f/u        Endocrine   Hypothyroidism    For high tsh and no missed doses (with fatigue)- inc levothyroxine to 75 mcg daily Re check tsh with next labs       Relevant Medications   levothyroxine (SYNTHROID, LEVOTHROID) tablet     Other   Anemia    Suspect due to arava (rheum med) No s/s of GI bleed  Will stop this drug-re check 2 wk Watch for abd pain or change in stool color and update  Some fatigue F/u 2-3 wk      Elevated transaminase level    Suspect this is due to her arava (rheum med) She has had adv rxn to others and also statins  No acetaminophen or alcohol No symptoms  Will hold med and re check this in approx 2 wk      Hyperglycemia    Lab Results  Component Value Date   HGBA1C 6.1 06/20/2014   Disc low glycemic diet to prevent DM  Unclear if Arava (rheum med)- could contrib (is stopping this also)      Hyponatremia    Poss either from hypothyroidism or recent inc in hct  Will re check at f/u  No symptoms

## 2014-07-09 DIAGNOSIS — M069 Rheumatoid arthritis, unspecified: Secondary | ICD-10-CM | POA: Insufficient documentation

## 2014-07-09 NOTE — Assessment & Plan Note (Signed)
Poss either from hypothyroidism or recent inc in hct  Will re check at f/u  No symptoms

## 2014-07-09 NOTE — Assessment & Plan Note (Signed)
Lab Results  Component Value Date   HGBA1C 6.1 06/20/2014   Disc low glycemic diet to prevent DM  Unclear if Arava (rheum med)- could contrib (is stopping this also)

## 2014-07-09 NOTE — Assessment & Plan Note (Signed)
For high tsh and no missed doses (with fatigue)- inc levothyroxine to 75 mcg daily Re check tsh with next labs

## 2014-07-09 NOTE — Assessment & Plan Note (Signed)
Suspect due to arava (rheum med) No s/s of GI bleed  Will stop this drug-re check 2 wk Watch for abd pain or change in stool color and update  Some fatigue F/u 2-3 wk

## 2014-07-09 NOTE — Assessment & Plan Note (Signed)
Recommend nasal saline/ breathing steam/ flonase otc (if tolerated) Will re check at f/u

## 2014-07-09 NOTE — Assessment & Plan Note (Signed)
bp in fair control at this time  BP Readings from Last 1 Encounters:  07/08/14 140/80   No changes needed Disc lifstyle change with low sodium diet and exercise  This continues to improve - would like to eventually get systolic under 277 Disc DASH diet

## 2014-07-09 NOTE — Assessment & Plan Note (Signed)
Suspect this is due to her arava (rheum med) She has had adv rxn to others and also statins  No acetaminophen or alcohol No symptoms  Will hold med and re check this in approx 2 wk

## 2014-07-15 ENCOUNTER — Telehealth: Payer: Self-pay | Admitting: Family Medicine

## 2014-07-15 NOTE — Telephone Encounter (Signed)
Spoken to patient. She stated no, she is not taking any iron or pepto. She is only taking the medications that are prescribed to her.

## 2014-07-15 NOTE — Telephone Encounter (Signed)
Please advise 

## 2014-07-15 NOTE — Telephone Encounter (Signed)
Called and notified patient of Dr Marliss Coots comments. Patient states that her stool is black. She will try the fiber supplements.

## 2014-07-15 NOTE — Telephone Encounter (Signed)
Pt called in loose crumbly stools and it has been going on since Feb 16th after her last appointment.  Pt wants to know if there is anything that can be done for this at home or if an appointment is needed. The best number to call the pt is [phone]

## 2014-07-15 NOTE — Telephone Encounter (Signed)
Let me know if stool has blood or is dark/black in color  Try a fiber supplement - citrucel or metamucil as directed with water once daily  Will disc further at March f/u and we can see if this helps

## 2014-07-15 NOTE — Telephone Encounter (Signed)
Is she taking any iron or pepto bismol?-these can turn stool black

## 2014-07-16 NOTE — Telephone Encounter (Signed)
Please get her 3 hemoccult cards (not ifob)- to test for blood in the stool to use with 3 separate bms and return them to [place] asap  Thanks - I want to make sure there is not blood in stool

## 2014-07-16 NOTE — Telephone Encounter (Signed)
Patient returned your call.  Please call her back at [phone].

## 2014-07-16 NOTE — Telephone Encounter (Signed)
Left a voice mail for patient to call back.

## 2014-07-16 NOTE — Telephone Encounter (Signed)
Called patient. Notified her of Dr Marliss Coots comments. Patient verbalized understanding. Will come by to pick up hemoccult cards.

## 2014-07-17 ENCOUNTER — Other Ambulatory Visit: Payer: Self-pay | Admitting: Family Medicine

## 2014-07-17 DIAGNOSIS — Z1211 Encounter for screening for malignant neoplasm of colon: Secondary | ICD-10-CM

## 2014-07-23 ENCOUNTER — Other Ambulatory Visit (INDEPENDENT_AMBULATORY_CARE_PROVIDER_SITE_OTHER): Payer: BLUE CROSS/BLUE SHIELD

## 2014-07-23 DIAGNOSIS — E039 Hypothyroidism, unspecified: Secondary | ICD-10-CM

## 2014-07-23 DIAGNOSIS — R7401 Elevation of levels of liver transaminase levels: Secondary | ICD-10-CM

## 2014-07-23 DIAGNOSIS — R740 Nonspecific elevation of levels of transaminase and lactic acid dehydrogenase [LDH]: Secondary | ICD-10-CM

## 2014-07-23 DIAGNOSIS — D649 Anemia, unspecified: Secondary | ICD-10-CM

## 2014-07-23 DIAGNOSIS — I10 Essential (primary) hypertension: Secondary | ICD-10-CM

## 2014-07-23 LAB — CBC WITH DIFFERENTIAL/PLATELET
Basophils Absolute: 0.1 10*3/uL (ref 0.0–0.1)
Basophils Relative: 0.9 % (ref 0.0–3.0)
EOS PCT: 2.7 % (ref 0.0–5.0)
Eosinophils Absolute: 0.2 10*3/uL (ref 0.0–0.7)
LYMPHS ABS: 2 10*3/uL (ref 0.7–4.0)
Lymphocytes Relative: 29.8 % (ref 12.0–46.0)
MCHC: 34 g/dL (ref 30.0–36.0)
MCV: 92.4 fl (ref 78.0–100.0)
MONOS PCT: 10.9 % (ref 3.0–12.0)
Monocytes Absolute: 0.7 10*3/uL (ref 0.1–1.0)
NEUTROS ABS: 3.7 10*3/uL (ref 1.4–7.7)
Neutrophils Relative %: 55.7 % (ref 43.0–77.0)
PLATELETS: 270 10*3/uL (ref 150.0–400.0)
RBC: 2.74 Mil/uL — ABNORMAL LOW (ref 3.87–5.11)
RDW: 15.4 % (ref 11.5–15.5)
WBC: 6.6 10*3/uL (ref 4.0–10.5)

## 2014-07-23 LAB — COMPREHENSIVE METABOLIC PANEL
ALBUMIN: 2.8 g/dL — AB (ref 3.5–5.2)
ALT: 30 U/L (ref 0–35)
AST: 67 U/L — ABNORMAL HIGH (ref 0–37)
Alkaline Phosphatase: 63 U/L (ref 39–117)
BILIRUBIN TOTAL: 0.5 mg/dL (ref 0.2–1.2)
BUN: 8 mg/dL (ref 6–23)
CHLORIDE: 104 meq/L (ref 96–112)
CO2: 27 meq/L (ref 19–32)
Calcium: 9 mg/dL (ref 8.4–10.5)
Creatinine, Ser: 0.69 mg/dL (ref 0.40–1.20)
GFR: 91.76 mL/min (ref >60.00)
GLUCOSE: 148 mg/dL — AB (ref 70–99)
POTASSIUM: 4.4 meq/L (ref 3.5–5.1)
SODIUM: 132 meq/L — AB (ref 135–145)
TOTAL PROTEIN: 10 g/dL — AB (ref 6.0–8.3)

## 2014-07-23 LAB — TSH: TSH: 7.8 u[IU]/mL — AB (ref 0.35–4.50)

## 2014-07-24 LAB — FERRITIN: Ferritin: 19.6 ng/mL (ref 10.0–291.0)

## 2014-07-25 ENCOUNTER — Telehealth: Payer: Self-pay | Admitting: Radiology

## 2014-07-25 ENCOUNTER — Encounter: Payer: Self-pay | Admitting: Family Medicine

## 2014-07-25 ENCOUNTER — Encounter: Payer: Self-pay | Admitting: Gastroenterology

## 2014-07-25 ENCOUNTER — Ambulatory Visit (INDEPENDENT_AMBULATORY_CARE_PROVIDER_SITE_OTHER): Payer: BLUE CROSS/BLUE SHIELD | Admitting: Family Medicine

## 2014-07-25 ENCOUNTER — Other Ambulatory Visit: Payer: BLUE CROSS/BLUE SHIELD

## 2014-07-25 VITALS — BP 122/74 | HR 73 | Temp 98.6°F | Ht 62.0 in | Wt 184.4 lb

## 2014-07-25 DIAGNOSIS — Z8711 Personal history of peptic ulcer disease: Secondary | ICD-10-CM

## 2014-07-25 DIAGNOSIS — R739 Hyperglycemia, unspecified: Secondary | ICD-10-CM

## 2014-07-25 DIAGNOSIS — E039 Hypothyroidism, unspecified: Secondary | ICD-10-CM

## 2014-07-25 DIAGNOSIS — D649 Anemia, unspecified: Secondary | ICD-10-CM

## 2014-07-25 DIAGNOSIS — Z1211 Encounter for screening for malignant neoplasm of colon: Secondary | ICD-10-CM

## 2014-07-25 LAB — HEMOCCULT SLIDES (X 3 CARDS)
FECAL OCCULT BLD: NEGATIVE
OCCULT 1: POSITIVE — AB
OCCULT 2: POSITIVE — AB
OCCULT 3: NEGATIVE
OCCULT 4: NEGATIVE
OCCULT 5: NEGATIVE

## 2014-07-25 MED ORDER — POLYSACCHARIDE IRON COMPLEX 150 MG PO CAPS: 150.0000 mg | ORAL_CAPSULE | Freq: Two times a day (BID) | ORAL | Status: DC

## 2014-07-25 MED ORDER — OMEPRAZOLE 20 MG PO CPDR: 20.0000 mg | DELAYED_RELEASE_CAPSULE | Freq: Every day | ORAL | Status: DC

## 2014-07-25 NOTE — Telephone Encounter (Signed)
[facility] called results of Hemoccult card, POSITIVE x 2, NEGATIVE x 4, results sent to Dr Glori Bickers

## 2014-07-25 NOTE — Progress Notes (Signed)
Subjective:    Patient ID: Jean Tucker, female    DOB: [birth year], 64 y.o.   MRN: [id number]  HPI Here for f/u of multiple medical issues   Last visit had abnormal labs -suspect from [place] she was taking She does not notice a change coming off of it  She stopped that  Also inc her thyroid dose  Wt is up 2 lb with bmi of 33    Chemistry      Component Value Date/Time   NA 132* 07/23/2014 0826   K 4.4 07/23/2014 0826   CL 104 07/23/2014 0826   CO2 27 07/23/2014 0826   BUN 8 07/23/2014 0826   CREATININE 0.69 07/23/2014 0826      Component Value Date/Time   CALCIUM 9.0 07/23/2014 0826   ALKPHOS 63 07/23/2014 0826   AST 67* 07/23/2014 0826   ALT 30 07/23/2014 0826   BILITOT 0.5 07/23/2014 0826     sodium level is mildly low (on hctz)  Stable glucose  Ast/alt are improved   Cbc is worse Lab Results  Component Value Date   WBC 6.6 07/23/2014   HGB 8.6 Repeated and verified X2.* 07/23/2014   HCT 25.4 Repeated and verified X2.* 07/23/2014   MCV 92.4 07/23/2014   PLT 270.0 07/23/2014   Hb is down from 10.7 "has always been a little anemic all of my life"- after surgery requires iron/ has never had a tranfusion Ferritin is low normal at 19.6 Given stool cards - has sent them off (coming back soon)  She noted one stool that was black - and then that improved  She has tight feeling up under her rib cage-almost like a cramp  No burning  Feels more full when she eats  She had some bleeding ulcers years ago (then was vomiting blood and passing blood)- was in a high stress situation  No nsaids (has taken in the past)--only one dose of ibuprofen last weekend for joint pain   Not on any ulcer or reflux medicine   Last EGD was over 30 years ago  Has not had a colonoscopy  No change in bowel habits (stays constipated-that is normal for her since childhood)    Lab Results  Component Value Date   TSH 7.80* 07/23/2014   This is improved from over 11 with inc dose of  levothyroxine   Has been really hungry lately in general    Review of Systems Review of Systems  Constitutional: Negative for fever, appetite change, fatigue and unexpected weight change.  Eyes: Negative for pain and visual disturbance.  Respiratory: Negative for cough and shortness of breath.   Cardiovascular: Negative for cp or palpitations    Gastrointestinal: Negative for nausea, diarrhea and constipation. pos for few dark stools, pos for epigastric discomfort  Genitourinary: Negative for urgency and frequency.  Skin: Negative for pallor or rash   Neurological: Negative for weakness, light-headedness, numbness and headaches.  Hematological: Negative for adenopathy. Does not bruise/bleed easily.  Psychiatric/Behavioral: Negative for dysphoric mood. The patient is not nervous/anxious.         Objective:   Physical Exam  Constitutional: She appears well-developed and well-nourished. No distress.  overwt and well app  HENT:  Head: Normocephalic and atraumatic.  Mouth/Throat: Oropharynx is clear and moist.  Eyes: EOM are normal. Pupils are equal, round, and reactive to light. No scleral icterus.  Mild conj pallor   Neck: Normal range of motion. Neck supple.  Cardiovascular: Normal rate and regular rhythm.  Murmur heard. Faint systolic murmur (suspect flow M)  Pulmonary/Chest: Effort normal and breath sounds normal. No respiratory distress. She has no wheezes. She has no rales.  Abdominal: Soft. Bowel sounds are normal. She exhibits no distension and no mass. There is no hepatosplenomegaly. There is tenderness in the epigastric area and periumbilical area. There is no rebound, no guarding, no CVA tenderness, no tenderness at McBurney's point and negative Murphy's sign.  Musculoskeletal: She exhibits no edema.  Lymphadenopathy:    She has no cervical adenopathy.  Neurological: She is alert.  Skin: Skin is warm and dry. No rash noted. No erythema. No pallor.  No jaundice     Psychiatric: She has a normal mood and affect.          Assessment & Plan:   Problem List Items Addressed This Visit      Endocrine   Hypothyroidism    Improving tsh with higher dose  Will check again in a month No clinical changes         Other   Anemia - Primary    In pt with hx of PUD remotely in the past  Hx of one black stool (stool card pending) HB is 8.6 -feeling tired -  Starting iron  Starting omeprazole   Ref to GI for eval   inst to call or seek care if symptoms worsen       Relevant Medications   iron polysaccharides (NU-IRON) 150 MG capsule   Other Relevant Orders   Ambulatory referral to Gastroenterology   History of peptic ulcer disease    With sharply decreased HB/hct and also some vague abd symptoms Start omeprazole (side eff disc) Develop stool cards Ref to GI   Will begin iron tabs for anemia as well as tolerated       Relevant Orders   Ambulatory referral to Gastroenterology   Hyperglycemia    Lab Results  Component Value Date   HGBA1C 6.1 06/20/2014   Overall controlled with diet Rev low glycemic diet

## 2014-07-25 NOTE — Telephone Encounter (Signed)
Called and notified patient of Dr Marliss Coots comments. Patient verbalized understanding.

## 2014-07-25 NOTE — Assessment & Plan Note (Signed)
In pt with hx of PUD remotely in the past  Hx of one black stool (stool card pending) HB is 8.6 -feeling tired -  Starting iron  Starting omeprazole   Ref to GI for eval   inst to call or seek care if symptoms worsen

## 2014-07-25 NOTE — Progress Notes (Signed)
Pre visit review using our clinic review tool, if applicable. No additional management support is needed unless otherwise documented below in the visit note. 

## 2014-07-25 NOTE — Patient Instructions (Signed)
Take iron twice daily (as tolerated) Take stool softeners over the counter for constipation Omeprazole 20 mg once daily for stomach acid/ ulcer prevention  Stop at check out for referral to GI    If you feel worse let me know  Schedule labs in 1 month

## 2014-07-25 NOTE — Telephone Encounter (Signed)
Let her know several of the stool cards are positive- not surprised  Keep appt with GI as planned

## 2014-07-27 NOTE — Assessment & Plan Note (Signed)
Improving tsh with higher dose  Will check again in a month No clinical changes

## 2014-07-27 NOTE — Assessment & Plan Note (Signed)
With sharply decreased HB/hct and also some vague abd symptoms Start omeprazole (side eff disc) Develop stool cards Ref to GI   Will begin iron tabs for anemia as well as tolerated

## 2014-07-27 NOTE — Assessment & Plan Note (Signed)
Lab Results  Component Value Date   HGBA1C 6.1 06/20/2014   Overall controlled with diet Rev low glycemic diet

## 2014-07-31 ENCOUNTER — Ambulatory Visit (INDEPENDENT_AMBULATORY_CARE_PROVIDER_SITE_OTHER): Payer: BLUE CROSS/BLUE SHIELD | Admitting: Gastroenterology

## 2014-07-31 ENCOUNTER — Encounter: Payer: Self-pay | Admitting: Gastroenterology

## 2014-07-31 VITALS — BP 140/80 | HR 72 | Ht 62.0 in | Wt 183.5 lb

## 2014-07-31 DIAGNOSIS — R195 Other fecal abnormalities: Secondary | ICD-10-CM

## 2014-07-31 DIAGNOSIS — D62 Acute posthemorrhagic anemia: Secondary | ICD-10-CM | POA: Insufficient documentation

## 2014-07-31 MED ORDER — NA SULFATE-K SULFATE-MG SULF 17.5-3.13-1.6 GM/177ML PO SOLN: 1.0000 | Freq: Once | ORAL | Status: DC

## 2014-07-31 NOTE — Patient Instructions (Signed)
You have been scheduled for an endoscopy and colonoscopy. Please follow the written instructions given to you at your visit today. Please pick up your prep supplies at the pharmacy within the next 1-3 days. If you use inhalers (even only as needed), please bring them with you on the day of your procedure.  cc: Loura Pardon, MD

## 2014-07-31 NOTE — Progress Notes (Signed)
07/31/2014 Jean Tucker [id number] December 23, 1950   HISTORY OF PRESENT ILLNESS:  This is a 64 year old female who is new to our practice and has been referred here by her PCP, Dr. Glori Bickers, for evaluation of anemia and heme positive stools.  She says that she was feeling tired so she saw her PCP and labs showed Hgb of 8.6 grams.  Hgb was 10.7 grams one month ago and was 13.2 grams last year.  It was thought that some of the anemia was due to her medication that she was taking for her RA Jolee Ewing).  She did report a couple of episodes of black stools around that time, however, so stool hemoccults were ordered.  The first two were positive and then she had 3 that were negative.  She says that the black stools only happened a couple of times and now the stools have been back to normal for about the past week or so.  She denies seeing any red or maroon blood in her stools.  She reports a history of "bleeding ulcers" in the 1970's at which time she had an EGD and colonoscopy, but has not undergone any GI procedures since that time.  She is no longer taking the medication for her RA.  Was previously taking NSAID's but has not taken any in a couple of months.  She has been started on omeprazole 20 mg daily and iron supplements by her PCP since this issue was discovered over the past week.     Past Medical History  Diagnosis Date  . Allergy   . GERD (gastroesophageal reflux disease)   . CAD (coronary artery disease)   . Hyperlipidemia   . Thyroid disease   . Osteoporosis   . Rheumatoid arthritis    Past Surgical History  Procedure Laterality Date  . Abdominal hysterectomy  1983    partial,bleeding  . Rotator cuff repair      X 4  . Tendon release    . Carpal tunnel release      X 2, right    reports that she quit smoking about 5 years ago. Her smoking use included Cigarettes. She has never used smokeless tobacco. She reports that she drinks alcohol. She reports that she does not use illicit  drugs. family history includes Alcohol abuse in her father; Colon cancer in her mother; Colon polyps in her mother; Diabetes in her mother; Hypertension in her mother; Ovarian cancer in her mother. There is no history of Esophageal cancer, Gallbladder disease, or Kidney disease. Allergies  Allergen Reactions  . Arava [Leflunomide] Other (See Comments)    Elevated liver tests  Dec albumin Anemia   . Atorvastatin     REACTION: elevated LFT's  . Codeine     REACTION: swelling  . Humira [Adalimumab] Other (See Comments)    Increased bp  . Methotrexate Derivatives Other (See Comments)    Liver tests elevated   . Simvastatin     REACTION: joint pain      Outpatient Encounter Prescriptions as of 07/31/2014  Medication Sig  . aspirin 325 MG tablet Take 325 mg by mouth daily.  . benzonatate (TESSALON) 200 MG capsule Take 1 capsule (200 mg total) by mouth 3 (three) times daily as needed for cough (swallow whole).  . clotrimazole-betamethasone (LOTRISONE) cream Apply 1 application topically 2 (two) times daily. To affected areas  . hydrochlorothiazide (HYDRODIURIL) 25 MG tablet Take 1 tablet (25 mg total) by mouth daily.  Marland Kitchen iron  polysaccharides (NU-IRON) 150 MG capsule Take 1 capsule (150 mg total) by mouth 2 (two) times daily. With food  . levothyroxine (SYNTHROID, LEVOTHROID) 75 MCG tablet Take 1 tablet (75 mcg total) by mouth daily.  Marland Kitchen omeprazole (PRILOSEC) 20 MG capsule Take 1 capsule (20 mg total) by mouth daily.  . Na Sulfate-K Sulfate-Mg Sulf SOLN Take 1 kit by mouth once.     REVIEW OF SYSTEMS  : All other systems reviewed and negative except where noted in the History of Present Illness.   PHYSICAL EXAM: BP 140/80 mmHg  Pulse 72  Ht $R'5\' 2"'Lw$  (1.575 m)  Wt 183 lb 8 oz (83.235 kg)  BMI 33.55 kg/m2 General: Well developed white female in no acute distress Head: Normocephalic and atraumatic Eyes:  Sclerae anicteric, conjunctiva pink. Ears: Normal auditory acuity Lungs: Clear  throughout to auscultation Heart: Regular rate and rhythm.  Murmur noted.   Abdomen: Soft, non-distended.  Normal bowel sounds.  Non-tender. Musculoskeletal: Symmetrical with no gross deformities  Skin: No lesions on visible extremities Extremities: No edema  Neurological: Alert oriented x 4, grossly non-focal Psychological:  Alert and cooperative. Normal mood and affect  ASSESSMENT AND PLAN: -Anemia, most likely a component of GI blood loss with black heme positive stools:  Some component of the anemia may be from her RA medication that she was taking, however, need to further investigate with the stool issues as well.  Has history of bleeding ulcers over 30 years ago.  I do not think that she has any active, acute bleeding at this time; stools have returned to normal and have been that way for the past week or so.  Will schedule for EGD and colonoscopy.  The risks, benefits, and alternatives were discussed with the patient and she consents to proceed.  Continue omeprazole daily and iron supplements per PCP.   CC:  Tower, Wynelle Fanny, MD

## 2014-07-31 NOTE — Progress Notes (Signed)
Reviewed and agree with management. Brayden Brodhead D. Tayanna Talford, M.D., FACG  

## 2014-08-07 ENCOUNTER — Encounter: Payer: Self-pay | Admitting: Gastroenterology

## 2014-08-22 ENCOUNTER — Encounter: Payer: Self-pay | Admitting: Gastroenterology

## 2014-08-22 ENCOUNTER — Other Ambulatory Visit: Payer: Self-pay | Admitting: *Deleted

## 2014-08-22 ENCOUNTER — Ambulatory Visit (AMBULATORY_SURGERY_CENTER): Payer: BLUE CROSS/BLUE SHIELD | Admitting: Gastroenterology

## 2014-08-22 ENCOUNTER — Other Ambulatory Visit: Payer: BLUE CROSS/BLUE SHIELD

## 2014-08-22 ENCOUNTER — Other Ambulatory Visit (INDEPENDENT_AMBULATORY_CARE_PROVIDER_SITE_OTHER): Payer: BLUE CROSS/BLUE SHIELD

## 2014-08-22 VITALS — BP 131/45 | HR 67 | Temp 97.8°F | Resp 21 | Ht 62.0 in | Wt 183.0 lb

## 2014-08-22 DIAGNOSIS — K7030 Alcoholic cirrhosis of liver without ascites: Secondary | ICD-10-CM | POA: Diagnosis not present

## 2014-08-22 DIAGNOSIS — D122 Benign neoplasm of ascending colon: Secondary | ICD-10-CM

## 2014-08-22 DIAGNOSIS — I8500 Esophageal varices without bleeding: Secondary | ICD-10-CM | POA: Diagnosis not present

## 2014-08-22 DIAGNOSIS — D62 Acute posthemorrhagic anemia: Secondary | ICD-10-CM | POA: Diagnosis not present

## 2014-08-22 DIAGNOSIS — K2970 Gastritis, unspecified, without bleeding: Secondary | ICD-10-CM

## 2014-08-22 DIAGNOSIS — K2990 Gastroduodenitis, unspecified, without bleeding: Secondary | ICD-10-CM | POA: Diagnosis not present

## 2014-08-22 DIAGNOSIS — D123 Benign neoplasm of transverse colon: Secondary | ICD-10-CM

## 2014-08-22 DIAGNOSIS — K5730 Diverticulosis of large intestine without perforation or abscess without bleeding: Secondary | ICD-10-CM

## 2014-08-22 LAB — IBC PANEL
Iron: 50 ug/dL (ref 42–145)
Saturation Ratios: 12.6 % — ABNORMAL LOW (ref 20.0–50.0)
Transferrin: 283 mg/dL (ref 212.0–360.0)

## 2014-08-22 LAB — FERRITIN: Ferritin: 15.5 ng/mL (ref 10.0–291.0)

## 2014-08-22 MED ORDER — SODIUM CHLORIDE 0.9 % IV SOLN: 500.0000 mL | INTRAVENOUS | Status: DC

## 2014-08-22 MED ORDER — NADOLOL 40 MG PO TABS: 40.0000 mg | ORAL_TABLET | Freq: Every day | ORAL | Status: DC

## 2014-08-22 NOTE — Progress Notes (Signed)
Pt. Received contrast for CT with directions pre CT scan. Labs to be drawn prior to discharge.

## 2014-08-22 NOTE — Progress Notes (Signed)
Called to room to assist during endoscopic procedure.  Patient ID and intended procedure confirmed with present staff. Received instructions for my participation in the procedure from the performing physician.  

## 2014-08-22 NOTE — Patient Instructions (Addendum)

## 2014-08-22 NOTE — Op Note (Signed)
[facility]  [address]. [place], [address]   ENDOSCOPY PROCEDURE REPORT  PATIENT: Jean Tucker, Jean Tucker  MR#: [id number] BIRTHDATE: 08-06-1950 , 37  yrs. old GENDER: female ENDOSCOPIST: Inda Castle, MD REFERRED BY:  Abner Greenspan, M.D. PROCEDURE DATE:  08/22/2014 PROCEDURE:  EGD, diagnostic and EGD w/ biopsy ASA CLASS:     Class II INDICATIONS:  melena. MEDICATIONS: Residual sedation present, Monitored anesthesia care, and Propofol 90 mg IV TOPICAL ANESTHETIC:  DESCRIPTION OF PROCEDURE: After the risks benefits and alternatives of the procedure were thoroughly explained, informed consent was obtained.  The LB [id number] endoscope was introduced through the mouth and advanced to the second portion of the duodenum , Without limitations.  The instrument was slowly withdrawn as the mucosa was fully examined.      STOMACH: Moderate gastritis (inflammation) was found in the gastric antrum and gastric body. there were streaks of fresh and old blood. There was adherent blood present.  Multiple biopsies were performed.   The remainder the exam including the duodenum was normal.  ESOPHAGUS: There weregrade 3 and 4 varices in the distal esophagus. The varices were not bleeding.  Retroflexed views revealed no abnormalities.     The scope was then withdrawn from the patient and the procedure completed.  COMPLICATIONS: There were no immediate complications.  ENDOSCOPIC IMPRESSION: 1.  esophageal varices 2.  diffuse bleeding gastritis (possible portal hypertensive gastropathy)  RECOMMENDATIONS: 1.  begin nadolol 40 mg daily 2.  CT  abdomen/pelvis 3.   serologies for hepatitis A, B, C, iron, TIBC, ferritin, alpha-1 antitrypsin level, anti-nuclear antibody, antimitochondrial antibody,SPEP, SIEP, INR, cerruloplasm level  REPEAT EXAM: 1 year  eSigned:  Inda Castle, MD 08/22/2014 3:27 PM    CC:  PATIENT NAME:  [name], [name] MR#: [id number]

## 2014-08-22 NOTE — Progress Notes (Signed)
Report to PACU, RN, vss, BBS= Clear.  

## 2014-08-22 NOTE — Op Note (Signed)
[facility]  [address]. [place], [address]   COLONOSCOPY PROCEDURE REPORT  PATIENT: Jean Tucker, Jean Tucker  MR#: [id number] BIRTHDATE: 1951-04-17 , 63  yrs. old GENDER: female ENDOSCOPIST: Inda Castle, MD REFERRED WL:[facility] Vernell Morgans, M.D. PROCEDURE DATE:  08/22/2014 PROCEDURE:   Colonoscopy, screening and Colonoscopy with snare polypectomy First Screening Colonoscopy - Avg.  risk and is 50 yrs.  old or older - No.  Prior Negative Screening - Now for repeat screening. 10 or more years since last screening  History of Adenoma - Now for follow-up colonoscopy & has been > or = to 3 yrs.  N/A ASA CLASS:   Class II INDICATIONS:Evaluation of unexplained GI bleeding and FH Colon or Rectal Adenocarcinoma. MEDICATIONS: Monitored anesthesia care and Propofol 250 mg IV  DESCRIPTION OF PROCEDURE:   After the risks benefits and alternatives of the procedure were thoroughly explained, informed consent was obtained.  The digital rectal exam revealed no abnormalities of the rectum.   The LB [id number]  endoscope was introduced through the anus and advanced to the cecum, which was identified by both the appendix and ileocecal valve. No adverse events experienced.   The quality of the prep was (Suprep was used) excellent.  The instrument was then slowly withdrawn as the colon was fully examined.      COLON FINDINGS: There was mild diverticulosis noted in the descending colon and sigmoid colon.   A sessile polyp measuring 3 mm in size was found in the ascending colon.  A polypectomy was performed with a cold snare.  The resection was complete, the polyp tissue was completely retrieved and sent to histology.  A polypectomy was performed with a cold snare.  The resection was complete, the polyp tissue was completely retrieved and sent to histology.   A sessile polyp measuring 10 mm in size was found in the proximal transverse colon.  A polypectomy was performed with  a cold snare.  The resection was complete, the polyp tissue was completely retrieved and sent to histology.   A sessile polyp measuring 3 mm in size was found in the distal transverse colon.  A polypectomy was performed with cold forceps.  Retroflexed views revealed no abnormalities. The time to cecum = 6.0 Withdrawal time = 7.7   The scope was withdrawn and the procedure completed. COMPLICATIONS: There were no immediate complications.  ENDOSCOPIC IMPRESSION: 1.   Mild diverticulosis was noted in the descending colon and sigmoid colon 2.   Sessile polyp was found in the ascending colon; polypectomy was performed with a cold snare; polypectomy was performed with a cold snare 3.   Sessile polyp was found in the proximal transverse colon; polypectomy was performed with a cold snare 4.   Sessile polyp was found in the distal transverse colon; polypectomy was performed with cold forceps  RECOMMENDATIONS: Given your significant family history of colon cancer, you should have a repeat colonoscopy in 5 years  eSigned:  Inda Castle, MD 08/22/2014 3:19 PM   cc:   PATIENT NAME:  Jean Tucker, Jean Tucker MR#: [id number]

## 2014-08-22 NOTE — Progress Notes (Signed)
Pt. Stayed in recovery area, awaiting lab draw prior to discharge.

## 2014-08-23 LAB — HEPATITIS B SURFACE ANTIGEN: Hepatitis B Surface Ag: NEGATIVE

## 2014-08-23 LAB — HEPATITIS B SURFACE ANTIBODY,QUALITATIVE

## 2014-08-23 LAB — HEPATITIS C ANTIBODY: HCV AB: NEGATIVE

## 2014-08-23 LAB — HEPATITIS A ANTIBODY, TOTAL: Hep A Total Ab: NONREACTIVE

## 2014-08-25 ENCOUNTER — Telehealth: Payer: Self-pay | Admitting: *Deleted

## 2014-08-25 ENCOUNTER — Telehealth: Payer: Self-pay

## 2014-08-25 LAB — ANA: ANA: NEGATIVE

## 2014-08-25 LAB — ALPHA-1-ANTITRYPSIN: A1 ANTITRYPSIN SER: 186 mg/dL (ref 83–199)

## 2014-08-25 LAB — MITOCHONDRIAL ANTIBODIES: MITOCHONDRIAL M2 AB, IGG: 2.12 — AB (ref <0.91)

## 2014-08-25 NOTE — Telephone Encounter (Signed)
Left message on f/u callback @ 0750 08/25/14

## 2014-08-25 NOTE — Telephone Encounter (Signed)
Called patient to give message from Dr. Deatra Ina about medication.

## 2014-08-26 ENCOUNTER — Other Ambulatory Visit: Payer: Self-pay

## 2014-08-26 ENCOUNTER — Other Ambulatory Visit (INDEPENDENT_AMBULATORY_CARE_PROVIDER_SITE_OTHER): Payer: BLUE CROSS/BLUE SHIELD

## 2014-08-26 DIAGNOSIS — D649 Anemia, unspecified: Secondary | ICD-10-CM

## 2014-08-26 DIAGNOSIS — K922 Gastrointestinal hemorrhage, unspecified: Secondary | ICD-10-CM

## 2014-08-26 DIAGNOSIS — E039 Hypothyroidism, unspecified: Secondary | ICD-10-CM

## 2014-08-26 LAB — IMMUNOFIXATION ELECTROPHORESIS
IgA: 768 mg/dL — ABNORMAL HIGH (ref 69–380)
IgG (Immunoglobin G), Serum: 4380 mg/dL — ABNORMAL HIGH (ref 690–1700)
IgM, Serum: 26 mg/dL — ABNORMAL LOW (ref 52–322)
Total Protein, Serum Electrophoresis: 9.5 g/dL — ABNORMAL HIGH (ref 6.0–8.3)

## 2014-08-26 LAB — PROTEIN ELECTROPHORESIS, SERUM
ALPHA-2-GLOBULIN: 0.8 g/dL (ref 0.5–0.9)
Albumin ELP: 2.9 g/dL — ABNORMAL LOW (ref 3.8–4.8)
Alpha-1-Globulin: 0.3 g/dL (ref 0.2–0.3)
BETA GLOBULIN: 0.5 g/dL (ref 0.4–0.6)
Beta 2: 0.5 g/dL (ref 0.2–0.5)
Gamma Globulin: 4.5 g/dL — ABNORMAL HIGH (ref 0.8–1.7)
TOTAL PROTEIN, SERUM ELECTROPHOR: 9.5 g/dL — AB (ref 6.1–8.1)

## 2014-08-26 LAB — CBC WITH DIFFERENTIAL/PLATELET
BASOS ABS: 0.1 10*3/uL (ref 0.0–0.1)
Basophils Relative: 1.1 % (ref 0.0–3.0)
EOS PCT: 2.2 % (ref 0.0–5.0)
Eosinophils Absolute: 0.2 10*3/uL (ref 0.0–0.7)
HCT: 27.9 % — ABNORMAL LOW (ref 36.0–46.0)
Hemoglobin: 9.3 g/dL — ABNORMAL LOW (ref 12.0–15.0)
LYMPHS ABS: 2 10*3/uL (ref 0.7–4.0)
LYMPHS PCT: 25.8 % (ref 12.0–46.0)
MCHC: 33.2 g/dL (ref 30.0–36.0)
MCV: 87.8 fl (ref 78.0–100.0)
Monocytes Absolute: 0.9 10*3/uL (ref 0.1–1.0)
Monocytes Relative: 11.8 % (ref 3.0–12.0)
NEUTROS PCT: 59.1 % (ref 43.0–77.0)
Neutro Abs: 4.7 10*3/uL (ref 1.4–7.7)
PLATELETS: 243 10*3/uL (ref 150.0–400.0)
RBC: 3.17 Mil/uL — AB (ref 3.87–5.11)
RDW: 16.6 % — ABNORMAL HIGH (ref 11.5–15.5)
WBC: 7.9 10*3/uL (ref 4.0–10.5)

## 2014-08-26 LAB — TSH: TSH: 8.92 u[IU]/mL — AB (ref 0.35–4.50)

## 2014-08-27 ENCOUNTER — Telehealth: Payer: Self-pay

## 2014-08-27 NOTE — Telephone Encounter (Signed)
Pt said she got my chart note and pt has not missed any of her thyroid medication and request new rx [facility]. Pt also request omeprazole with [facility]ills [facility] as well.

## 2014-08-27 NOTE — Telephone Encounter (Signed)
Pt left v/m requesting cb. Left v/m requesting pt to cb. 

## 2014-08-28 ENCOUNTER — Ambulatory Visit (INDEPENDENT_AMBULATORY_CARE_PROVIDER_SITE_OTHER)
Admission: RE | Admit: 2014-08-28 | Discharge: 2014-08-28 | Disposition: A | Discharge status: Home / Self Care | Payer: BLUE CROSS/BLUE SHIELD | Source: Ambulatory Visit | Attending: Gastroenterology | Admitting: Gastroenterology

## 2014-08-28 DIAGNOSIS — K922 Gastrointestinal hemorrhage, unspecified: Secondary | ICD-10-CM | POA: Diagnosis not present

## 2014-08-28 MED ORDER — LEVOTHYROXINE SODIUM 100 MCG PO TABS: 100.0000 ug | ORAL_TABLET | Freq: Every day | ORAL | Status: DC

## 2014-08-28 MED ORDER — OMEPRAZOLE 20 MG PO CPDR: 20.0000 mg | DELAYED_RELEASE_CAPSULE | Freq: Every day | ORAL | Status: DC

## 2014-08-28 NOTE — Telephone Encounter (Signed)
Patient notified RX was sent electronically to [facility].

## 2014-08-28 NOTE — Telephone Encounter (Signed)
Will refill electronically  

## 2014-09-01 ENCOUNTER — Encounter: Payer: Self-pay | Admitting: Gastroenterology

## 2014-09-02 ENCOUNTER — Encounter: Payer: Self-pay | Admitting: Gastroenterology

## 2014-09-22 ENCOUNTER — Encounter: Payer: Self-pay | Admitting: Gastroenterology

## 2014-09-22 ENCOUNTER — Other Ambulatory Visit (INDEPENDENT_AMBULATORY_CARE_PROVIDER_SITE_OTHER): Payer: BLUE CROSS/BLUE SHIELD

## 2014-09-22 ENCOUNTER — Ambulatory Visit (INDEPENDENT_AMBULATORY_CARE_PROVIDER_SITE_OTHER): Payer: BLUE CROSS/BLUE SHIELD | Admitting: Gastroenterology

## 2014-09-22 VITALS — BP 128/78 | HR 77 | Ht 62.0 in | Wt 189.0 lb

## 2014-09-22 DIAGNOSIS — I8500 Esophageal varices without bleeding: Secondary | ICD-10-CM

## 2014-09-22 DIAGNOSIS — K7469 Other cirrhosis of liver: Secondary | ICD-10-CM

## 2014-09-22 DIAGNOSIS — K7460 Unspecified cirrhosis of liver: Secondary | ICD-10-CM | POA: Insufficient documentation

## 2014-09-22 HISTORY — DX: Esophageal varices without bleeding: I85.00

## 2014-09-22 LAB — PROTIME-INR
INR: 1.2 ratio — ABNORMAL HIGH (ref 0.8–1.0)
PROTHROMBIN TIME: 13.3 s — AB (ref 9.6–13.1)

## 2014-09-22 NOTE — Addendum Note (Signed)
Addended by: Oda Kilts on: 09/22/2014 03:05 PM   Modules accepted: Orders

## 2014-09-22 NOTE — Assessment & Plan Note (Signed)
Corgard started. Plan follow-up endoscopy one year

## 2014-09-22 NOTE — Patient Instructions (Signed)
Your Liver biopsy is scheduled on 11/11/2014 at 7:30am Separate instructions have been given  Go to the basement for labs today

## 2014-09-22 NOTE — Progress Notes (Signed)
      History of Present Illness:  Ms. Trzcinski has returned following upper and lower endoscopy.  The former demonstrated esophageal varices and portal hypertensive gastropathy.  Adenomatous polyps were removed at colonoscopy.  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  Is no history of hepatitis or alcohol use.  She took methotrexate for only one year.  CT scan, which I reviewed, demonstrated a nodular liver.    Review of Systems: Pertinent positive and negative review of systems were noted in the above HPI section. All other review of systems were otherwise negative.    Current Medications, Allergies, Past Medical History, Past Surgical History, Family History and Social History were reviewed in [facility] record  Vital signs were reviewed in today's medical record. Physical Exam: General: Well developed , well nourished, no acute distress On abdominal exam liver is palpable 2 fingerbreadths below the right costal margin and percusses to 12 cm   See Assessment and Plan under Problem List

## 2014-09-22 NOTE — Assessment & Plan Note (Signed)
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  PBC is less likely.  Although the antimitochondrial antibody is elevated, she does not have a significant cholestatic pattern.  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  Recommendations #1 check anti-liver kidney microsomal antibody #2 percutaneous liver biopsy - I'm looking for evidence for active inflammation which may lend itself to treatment

## 2014-09-24 LAB — ANTI-MICROSOMAL ANTIBODY LIVER / KIDNEY

## 2014-11-03 ENCOUNTER — Encounter (HOSPITAL_COMMUNITY): Payer: Self-pay | Admitting: *Deleted

## 2014-11-03 ENCOUNTER — Other Ambulatory Visit: Payer: Self-pay | Admitting: Family Medicine

## 2014-11-03 NOTE — Telephone Encounter (Signed)
Received refill request electronically from pharmacy.  Last refill 05/01/14, last office visit 07/25/14. Is it okay to refill medication?

## 2014-11-03 NOTE — Telephone Encounter (Signed)
Please refill times one  

## 2014-11-03 NOTE — Telephone Encounter (Signed)
Refill sent to pharmacy as instructed. 

## 2014-11-11 ENCOUNTER — Encounter (HOSPITAL_COMMUNITY): Payer: Self-pay

## 2014-11-11 ENCOUNTER — Ambulatory Visit (HOSPITAL_COMMUNITY)
Admission: RE | Admit: 2014-11-11 | Discharge: 2014-11-11 | Disposition: A | Discharge status: Home / Self Care | Payer: BLUE CROSS/BLUE SHIELD | Source: Ambulatory Visit | Attending: Gastroenterology | Admitting: Gastroenterology

## 2014-11-11 ENCOUNTER — Encounter (HOSPITAL_COMMUNITY)
Admission: RE | Disposition: A | Discharge status: Home / Self Care | Payer: Self-pay | Source: Ambulatory Visit | Attending: Gastroenterology

## 2014-11-11 ENCOUNTER — Ambulatory Visit (HOSPITAL_COMMUNITY): Payer: BLUE CROSS/BLUE SHIELD

## 2014-11-11 DIAGNOSIS — R188 Other ascites: Secondary | ICD-10-CM | POA: Diagnosis not present

## 2014-11-11 DIAGNOSIS — I8510 Secondary esophageal varices without bleeding: Secondary | ICD-10-CM | POA: Diagnosis not present

## 2014-11-11 DIAGNOSIS — K7460 Unspecified cirrhosis of liver: Secondary | ICD-10-CM | POA: Diagnosis not present

## 2014-11-11 DIAGNOSIS — Z7952 Long term (current) use of systemic steroids: Secondary | ICD-10-CM | POA: Insufficient documentation

## 2014-11-11 DIAGNOSIS — Z87891 Personal history of nicotine dependence: Secondary | ICD-10-CM | POA: Diagnosis not present

## 2014-11-11 DIAGNOSIS — D892 Hypergammaglobulinemia, unspecified: Secondary | ICD-10-CM | POA: Insufficient documentation

## 2014-11-11 DIAGNOSIS — E785 Hyperlipidemia, unspecified: Secondary | ICD-10-CM | POA: Insufficient documentation

## 2014-11-11 DIAGNOSIS — K219 Gastro-esophageal reflux disease without esophagitis: Secondary | ICD-10-CM | POA: Diagnosis not present

## 2014-11-11 DIAGNOSIS — E039 Hypothyroidism, unspecified: Secondary | ICD-10-CM | POA: Diagnosis not present

## 2014-11-11 DIAGNOSIS — I2510 Atherosclerotic heart disease of native coronary artery without angina pectoris: Secondary | ICD-10-CM | POA: Diagnosis not present

## 2014-11-11 DIAGNOSIS — K766 Portal hypertension: Secondary | ICD-10-CM | POA: Diagnosis not present

## 2014-11-11 DIAGNOSIS — Z7982 Long term (current) use of aspirin: Secondary | ICD-10-CM | POA: Diagnosis not present

## 2014-11-11 DIAGNOSIS — Z79899 Other long term (current) drug therapy: Secondary | ICD-10-CM | POA: Insufficient documentation

## 2014-11-11 DIAGNOSIS — K7469 Other cirrhosis of liver: Secondary | ICD-10-CM

## 2014-11-11 DIAGNOSIS — M069 Rheumatoid arthritis, unspecified: Secondary | ICD-10-CM | POA: Insufficient documentation

## 2014-11-11 DIAGNOSIS — K739 Chronic hepatitis, unspecified: Secondary | ICD-10-CM | POA: Diagnosis not present

## 2014-11-11 HISTORY — DX: Hypothyroidism, unspecified: E03.9

## 2014-11-11 SURGERY — CANCELLED PROCEDURE

## 2014-11-11 MED ORDER — MIDAZOLAM HCL 5 MG/ML IJ SOLN
INTRAMUSCULAR | Status: AC
  Filled 2014-11-11: qty 1

## 2014-11-11 MED ORDER — KETOROLAC TROMETHAMINE 15 MG/ML IJ SOLN
15.0000 mg | Freq: Once | INTRAMUSCULAR | Status: AC
  Administered 2014-11-11: 15 mg via INTRAVENOUS
  Filled 2014-11-11: qty 1

## 2014-11-11 MED ORDER — FENTANYL CITRATE (PF) 100 MCG/2ML IJ SOLN
INTRAMUSCULAR | Status: AC
  Filled 2014-11-11: qty 2

## 2014-11-11 MED ORDER — SODIUM CHLORIDE 0.9 % IV SOLN
INTRAVENOUS | Status: DC
  Administered 2014-11-11 (×2): 500 mL via INTRAVENOUS

## 2014-11-11 MED ORDER — MIDAZOLAM HCL 2 MG/2ML IJ SOLN
INTRAMUSCULAR | Status: AC | PRN
  Administered 2014-11-11: 0.5 mg via INTRAVENOUS
  Administered 2014-11-11: 1 mg via INTRAVENOUS

## 2014-11-11 MED ORDER — LIDOCAINE HCL 1 % IJ SOLN
INTRAMUSCULAR | Status: AC
  Filled 2014-11-11: qty 20

## 2014-11-11 MED ORDER — FENTANYL CITRATE (PF) 100 MCG/2ML IJ SOLN
INTRAMUSCULAR | Status: AC | PRN
  Administered 2014-11-11: 25 ug via INTRAVENOUS
  Administered 2014-11-11: 50 ug via INTRAVENOUS

## 2014-11-11 NOTE — Procedures (Deleted)
No note

## 2014-11-11 NOTE — Procedures (Signed)
Interventional Radiology Procedure Note  Procedure: US guided liver biopsy for medical liver biopsy.  3 x 18 g core right lobe Complications: No immediate Recommendations:  - Ok to shower tomorrow   - Routine care   Signed,  Dulcy Fanny. Earleen Newport, DO

## 2014-11-11 NOTE — Discharge Instructions (Signed)
Liver Biopsy, Care After °Refer to this sheet in the next few weeks. These instructions provide you with information on caring for yourself after your procedure. Your health care provider may also give you more specific instructions. Your treatment has been planned according to current medical practices, but problems sometimes occur. Call your health care provider if you have any problems or questions after your procedure. °WHAT TO EXPECT AFTER THE PROCEDURE °After your procedure, it is typical to have the following: °· A small amount of discomfort in the area where the biopsy was done and in the right shoulder or shoulder blade. °· A small amount of bruising around the area where the biopsy was done and on the skin over the liver. °· Sleepiness and fatigue for the rest of the day. °HOME CARE INSTRUCTIONS  °· Rest at home for 1-2 days or as directed by your health care provider. °· Have a friend or family member stay with you for at least 24 hours. °· Because of the medicines used during the procedure, you should not do the following things in the first 24 hours: °¨ Drive. °¨ Use machinery. °¨ Be responsible for the care of other people. °¨ Sign legal documents. °¨ Take a bath or shower. °· There are many different ways to close and cover an incision, including stitches, skin glue, and adhesive strips. Follow your health care provider's instructions on: °¨ Incision care. °¨ Bandage (dressing) changes and removal. °¨ Incision closure removal. °· Do not drink alcohol in the first week. °· Do not lift more than 5 pounds or play contact sports for 2 weeks after this test. °· Take medicines only as directed by your health care provider. Do not take medicine containing aspirin or non-steroidal anti-inflammatory medicines such as ibuprofen for 1 week after this test. °· It is your responsibility to get your test results. °SEEK MEDICAL CARE IF:  °· You have increased bleeding from an incision that results in more than a  small spot of blood. °· You have redness, swelling, or increasing pain in any incisions. °· You notice a discharge or a bad smell coming from any of your incisions. °· You have a fever or chills. °SEEK IMMEDIATE MEDICAL CARE IF:  °· You develop swelling, bloating, or pain in your abdomen. °· You become dizzy or faint. °· You develop a rash. °· You are nauseous or vomit. °· You have difficulty breathing, feel short of breath, or feel faint. °· You develop chest pain. °· You have problems with your speech or vision. °· You have trouble balancing or moving your arms or legs. °Document Released: 11/26/2004 Document Revised: 09/23/2013 Document Reviewed: 07/05/2013 °Exit[employer]® Patient Information ©2015 [employer]. This information is not intended to replace advice given to you by your health care provider. Make sure you discuss any questions you have with your health care provider. °Liver Biopsy °The liver is a large organ in the upper right-hand side of your abdomen. A liver biopsy is a procedure in which a tissue sample is taken from the liver and examined under a microscope. The procedure is done to confirm a suspected problem. °There are three types of liver biopsies: °· Percutaneous. In this type, an incision is made in your abdomen. The sample is removed through the incision with a needle. °· Laparoscopic. In this type, several incisions are made in the abdomen. A tiny camera is passed through one of the incisions to help guide the health care provider. The sample is removed through   the other incision or incisions. °· Transjugular. In this type, an incision is made in the neck. A tube is passed through the incision to the liver. The sample is removed through the tube with a needle. °LET YOUR HEALTH CARE PROVIDER KNOW ABOUT: °· Any allergies you have. °· All medicines you are taking, including vitamins, herbs, eye drops, creams, and over-the-counter medicines. °· Previous problems you or members of your family  have had with the use of anesthetics. °· Any blood disorders you have. °· Previous surgeries you have had. °· Medical conditions you have. °· Possibility of pregnancy, if this applies. °RISKS AND COMPLICATIONS °Generally, this is a safe procedure. However, problems can occur and include: °· Bleeding. °· Infection. °· Bruising. °· Collapsed lung. °· Leak of digestive juices (bile) from the liver or gallbladder. °· Problems with heart rhythm. °· Pain at the biopsy site or in the right shoulder. °· Low blood pressure (hypotension). °· Injury to nearby organs or tissues. °BEFORE THE PROCEDURE °· Your health care provider may do some blood or urine tests. These will help your health care provider learn how well your kidneys and liver are working and how well your blood clots. °· Ask your health care provider if you will be able to go home the day of the procedure. Arrange for someone to take you home and stay with you for at least 24 hours. °· Do not eat or drink anything after midnight on the night before the procedure or as directed by your health care provider. °· Ask your health care provider about: °· Changing or stopping your regular medicines. This is especially important if you are taking diabetes medicines or blood thinners. °· Taking medicines such as aspirin and ibuprofen. These medicines can thin your blood. Do not take these medicines before your procedure if your health care provider asks you not to. °PROCEDURE °Regardless of the type of biopsy that will be done, you will have an IV line placed. Through this line, you will receive fluids and medicine to relax you. If you will be having a laparoscopic biopsy, you may also receive medicine through this line to make you sleep during the procedure (general anesthetic). °Percutaneous Liver Biopsy °· You will positioned on your back, with your right hand over your head. °· A health care provider will locate your liver by tapping and pressing on the right side of  your abdomen or with the help of an ultrasound machine or CT scan. °· An area at the bottom of your last right rib will be numbed. °· An incision will be made in the numbed area. °· The biopsy needle will be inserted into the incision. °· Several samples of liver tissue will be taken with the biopsy needle. You will be asked to hold your breath as each sample is taken. °Laparoscopic Liver Biopsy °· You will be positioned on your back. °· Several small incisions will be made in your abdomen. °· Your doctor will pass a tiny camera through one incision. The camera will allow the liver to be viewed on a TV monitor in the operating room. °· Tools will be passed through the other incision or incisions. These tools will be used to remove samples of liver tissue. °Transjugular Liver Biopsy °· You will be positioned on your back on an X-ray table, with your head turned to your left. °· An area on your neck just over your jugular vein will be numbed. °· An incision will be made in the   numbed area. °· A tiny tube will be inserted through the incision. It will be pushed through the jugular vein to a blood vessel in the liver called the hepatic vein. °· Dye will be inserted through the tube, and X-rays will be taken. The dye will make the blood vessels in the liver light up on the X-rays. °· The biopsy needle will be pushed through the tube until it reaches the liver. °· Samples of liver tissue will be taken with the biopsy needle. °· The needle and the tube will be removed. °After the samples are obtained, the incision or incisions will be closed. °AFTER THE PROCEDURE °· You will be taken to a recovery area. °· You may have to lie on your right side for 1-2 hours. This will prevent bleeding from the biopsy site. °· Your progress will be watched. Your blood pressure, pulse, and the biopsy site will be checked often. °· You may have some pain or feel sick. If this happens, tell your health care provider. °· As you begin to feel  better, you will be offered ice and beverages. °· You may be allowed to go home when the medicines have worn off and you can walk, drink, eat, and use the bathroom. °Document Released: 07/30/2003 Document Revised: 09/23/2013 Document Reviewed: 07/05/2013 °ExitCare® Patient Information ©2015 [employer]. This information is not intended to replace advice given to you by your health care provider. Make sure you discuss any questions you have with your health care provider. °Conscious Sedation °Sedation is the use of medicines to promote relaxation and relieve discomfort and anxiety. Conscious sedation is a type of sedation. Under conscious sedation you are less alert than normal but are still able to respond to instructions or stimulation. Conscious sedation is used during short medical and dental procedures. It is milder than deep sedation or general anesthesia and allows you to return to your regular activities sooner.  °LET YOUR HEALTH CARE PROVIDER KNOW ABOUT:  °· Any allergies you have. °· All medicines you are taking, including vitamins, herbs, eye drops, creams, and over-the-counter medicines. °· Use of steroids (by mouth or creams). °· Previous problems you or members of your family have had with the use of anesthetics. °· Any blood disorders you have. °· Previous surgeries you have had. °· Medical conditions you have. °· Possibility of pregnancy, if this applies. °· Use of cigarettes, alcohol, or illegal drugs. °RISKS AND COMPLICATIONS °Generally, this is a safe procedure. However, as with any procedure, problems can occur. Possible problems include: °· Oversedation. °· Trouble breathing on your own. You may need to have a breathing tube until you are awake and breathing on your own. °· Allergic reaction to any of the medicines used for the procedure. °BEFORE THE PROCEDURE °· You may have blood tests done. These tests can help show how well your kidneys and liver are working. They can also show how well  your blood clots. °· A physical exam will be done.   °· Only take medicines as directed by your health care provider. You may need to stop taking medicines (such as blood thinners, aspirin, or nonsteroidal anti-inflammatory drugs) before the procedure.   °· Do not eat or drink at least 6 hours before the procedure or as directed by your health care provider. °· Arrange for a responsible adult, family member, or friend to take you home after the procedure. He or she should stay with you for at least 24 hours after the procedure, until the medicine has worn   off. °PROCEDURE  °· An intravenous (IV) catheter will be inserted into one of your veins. Medicine will be able to flow directly into your body through this catheter. You may be given medicine through this tube to help prevent pain and help you relax. °· The medical or dental procedure will be done. °AFTER THE PROCEDURE °· You will stay in a recovery area until the medicine has worn off. Your blood pressure and pulse will be checked.   °·  Depending on the procedure you had, you may be allowed to go home when you can tolerate liquids and your pain is under control. °Document Released: 02/01/2001 Document Revised: 05/14/2013 Document Reviewed: 01/14/2013 °ExitCare® Patient Information ©2015 [employer]. This information is not intended to replace advice given to you by your health care provider. Make sure you discuss any questions you have with your health care provider. ° °

## 2014-11-11 NOTE — H&P (Addendum)
   History of Present Illness: Ms. Geck has returned following upper and lower endoscopy. The former demonstrated esophageal varices and portal hypertensive gastropathy. Adenomatous polyps were removed at colonoscopy. 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 Is no history of hepatitis or alcohol use. She took methotrexate for only one year. CT scan, which I reviewed, demonstrated a nodular liver.    Review of Systems: Pertinent positive and negative review of systems were noted in the above HPI section. All other review of systems were otherwise negative.    Current Medications, Allergies, Past Medical History, Past Surgical History, Family History and Social History were reviewed in [facility] record  Vital signs were reviewed in today's medical record. 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 PBC is less likely. Although the antimitochondrial antibody is elevated, she does not have a significant cholestatic pattern. 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  Recommendations - percutaneous liver biopsy - I'm looking for evidence for active inflammation which may lend itself to treatment         Ultrasound was performed to mark the liver.  A small amount of ascites was present.  Because of the ascites it was decided not to do a percutaneous liver biopsy.  We  did identify an area below the ribs where there is no ascites.  Case was discussed with Dr. Damita Dunnings from interventional radiology.  It was decided to schedule liver biopsy through interventional radiology under direct ultrasound guidance in an area where there is not ascites.

## 2014-11-11 NOTE — Progress Notes (Signed)
Pts most recent CBC, CMP, and PT/INR were on 09-22-14. Dr. Earleen Newport aware and willing to proceed with liver biopsy.

## 2014-11-11 NOTE — Consult Note (Signed)
Chief Complaint: Cirrhosis.  Referring Physician(s): Dr Deatra Ina  History of Present Illness: Jean Tucker is a 64 y.o. female presenting for an US guided liver biopsy, with history of cirrhosis.    A full informed consent has been performed regarding percutaneous liver biopsy.    Risks and Benefits discussed with the patient including, but not limited to bleeding, infection, damage to adjacent structures or low yield requiring additional tests. All of the patient's questions were answered, patient is agreeable to proceed. Consent signed and in chart.    Past Medical History  Diagnosis Date  . Allergy   . GERD (gastroesophageal reflux disease)   . CAD (coronary artery disease)   . Hyperlipidemia   . Thyroid disease   . Osteoporosis   . Rheumatoid arthritis   . Esophageal varices 09/22/2014    April, 2016-grade 2-3 varices  . Hypothyroidism     Past Surgical History  Procedure Laterality Date  . Abdominal hysterectomy  1983    partial,bleeding  . Rotator cuff repair      X 4  . Tendon release    . Carpal tunnel release      X 2, right    Allergies: Arava; Atorvastatin; Codeine; Humira; Methotrexate derivatives; and Simvastatin  Medications: Prior to Admission medications   Medication Sig Start Date End Date Taking? Authorizing Provider  aspirin 325 MG tablet Take 325 mg by mouth daily.   Yes Historical Provider, MD  benzonatate (TESSALON) 200 MG capsule Take 1 capsule (200 mg total) by mouth 3 (three) times daily as needed for cough (swallow whole). 06/20/14  Yes Abner Greenspan, MD  clotrimazole-betamethasone (LOTRISONE) cream APPLY 1 APPLICATION TOPICALLY TWICE A DAY TO AFFECTED AREAS 11/03/14  Yes Abner Greenspan, MD  ferrous sulfate 325 (65 FE) MG tablet Take 325 mg by mouth daily with breakfast.   Yes Historical Provider, MD  levothyroxine (SYNTHROID, LEVOTHROID) 100 MCG tablet Take 1 tablet (100 mcg total) by mouth daily. 08/28/14  Yes Abner Greenspan, MD  nadolol  (CORGARD) 40 MG tablet Take 1 tablet (40 mg total) by mouth daily. 08/22/14  Yes Inda Castle, MD  omeprazole (PRILOSEC) 20 MG capsule Take 1 capsule (20 mg total) by mouth daily. 08/28/14  Yes Abner Greenspan, MD  hydrochlorothiazide (HYDRODIURIL) 25 MG tablet Take 1 tablet (25 mg total) by mouth daily. Patient not taking: Reported on 10/22/2014 06/20/14   Abner Greenspan, MD  iron polysaccharides (NU-IRON) 150 MG capsule Take 1 capsule (150 mg total) by mouth 2 (two) times daily. With food Patient not taking: Reported on 10/22/2014 07/25/14   Abner Greenspan, MD     Family History  Problem Relation Age of Onset  . Diabetes Mother   . Hypertension Mother   . Alcohol abuse Father   . Ovarian cancer Mother   . Colon cancer Mother   . Colon polyps Mother   . Esophageal cancer Neg Hx   . Gallbladder disease Neg Hx   . Kidney disease Neg Hx     History   Social History  . Marital Status: Married    Spouse Name: N/A  . Number of Children: 3  . Years of Education: N/A   Occupational History  . Retired    Social History Main Topics  . Smoking status: Former Smoker    Types: Cigarettes    Quit date: 05/23/2009  . Smokeless tobacco: Never Used  . Alcohol Use: 0.0 oz/week    0 Standard  drinks or equivalent per week     Comment: Occassionally  . Drug Use: No  . Sexual Activity: Not on file   Other Topics Concern  . None   Social History Narrative    Review of Systems: A 12 point ROS discussed and pertinent positives are indicated in the HPI above.  All other systems are negative.  Review of Systems  Vital Signs: BP 134/69 mmHg  Pulse 58  Temp(Src) 98.3 F (36.8 C) (Oral)  Resp 19  Ht 5\' 2"  (1.575 m)  Wt 189 lb (85.73 kg)  BMI 34.56 kg/m2  SpO2 99%  Physical Exam  Mallampati Score:  2  Imaging: No results found.  Labs:  CBC:  Recent Labs  06/20/14 1018 07/23/14 0826 08/26/14 0859  WBC 8.0 6.6 7.9  HGB 10.7* 8.6 Repeated and verified X2.* 9.3*  HCT 31.5* 25.4  Repeated and verified X2.* 27.9*  PLT 257.0 270.0 243.0    COAGS:  Recent Labs  09/22/14 1508  INR 1.2*    BMP:  Recent Labs  06/20/14 1018 07/23/14 0826  NA 131* 132*  K 4.2 4.4  CL 102 104  CO2 25 27  GLUCOSE 138* 148*  BUN 12 8  CALCIUM 9.4 9.0  CREATININE 0.74 0.69    LIVER FUNCTION TESTS:  Recent Labs  06/20/14 1018 07/23/14 0826  BILITOT 0.4 0.5  AST 81* 67*  ALT 36* 30  ALKPHOS 81 63  PROT 10.7* 10.0*  ALBUMIN 2.9* 2.8*    TUMOR MARKERS: No results for input(s): AFPTM, CEA, CA199, CHROMGRNA in the last 8760 hours.  Assessment and Plan:  Percutaneous liver biopsy, US guided.   Thank you for this interesting consult.  I greatly enjoyed meeting LILE MCCURLEY and look forward to participating in their care.  SignedCorrie Mckusick 11/11/2014, 9:19 AM   I spent a total of  10 Minutes   in face to face in clinical consultation, greater than 50% of which was counseling/coordinating care for cirrhosis, and US guided liver biopsy. Signed,  Dulcy Fanny. Earleen Newport, DO

## 2014-12-01 ENCOUNTER — Inpatient Hospital Stay
Admission: EM | Admit: 2014-12-01 | Discharge: 2014-12-05 | DRG: 391 | Disposition: A | Discharge status: Home / Self Care | Payer: BLUE CROSS/BLUE SHIELD | Attending: Internal Medicine | Admitting: Internal Medicine

## 2014-12-01 ENCOUNTER — Inpatient Hospital Stay: Payer: BLUE CROSS/BLUE SHIELD

## 2014-12-01 ENCOUNTER — Emergency Department: Payer: BLUE CROSS/BLUE SHIELD

## 2014-12-01 ENCOUNTER — Encounter: Payer: Self-pay | Admitting: *Deleted

## 2014-12-01 DIAGNOSIS — K922 Gastrointestinal hemorrhage, unspecified: Secondary | ICD-10-CM | POA: Diagnosis present

## 2014-12-01 DIAGNOSIS — K259 Gastric ulcer, unspecified as acute or chronic, without hemorrhage or perforation: Secondary | ICD-10-CM | POA: Diagnosis present

## 2014-12-01 DIAGNOSIS — K3189 Other diseases of stomach and duodenum: Principal | ICD-10-CM | POA: Diagnosis present

## 2014-12-01 DIAGNOSIS — Z7982 Long term (current) use of aspirin: Secondary | ICD-10-CM | POA: Diagnosis not present

## 2014-12-01 DIAGNOSIS — B182 Chronic viral hepatitis C: Secondary | ICD-10-CM | POA: Diagnosis present

## 2014-12-01 DIAGNOSIS — K7460 Unspecified cirrhosis of liver: Secondary | ICD-10-CM | POA: Diagnosis present

## 2014-12-01 DIAGNOSIS — E785 Hyperlipidemia, unspecified: Secondary | ICD-10-CM | POA: Diagnosis present

## 2014-12-01 DIAGNOSIS — Z87891 Personal history of nicotine dependence: Secondary | ICD-10-CM

## 2014-12-01 DIAGNOSIS — M810 Age-related osteoporosis without current pathological fracture: Secondary | ICD-10-CM | POA: Diagnosis present

## 2014-12-01 DIAGNOSIS — K766 Portal hypertension: Secondary | ICD-10-CM | POA: Diagnosis present

## 2014-12-01 DIAGNOSIS — K2970 Gastritis, unspecified, without bleeding: Secondary | ICD-10-CM | POA: Diagnosis present

## 2014-12-01 DIAGNOSIS — I10 Essential (primary) hypertension: Secondary | ICD-10-CM | POA: Diagnosis present

## 2014-12-01 DIAGNOSIS — M069 Rheumatoid arthritis, unspecified: Secondary | ICD-10-CM | POA: Diagnosis present

## 2014-12-01 DIAGNOSIS — I8500 Esophageal varices without bleeding: Secondary | ICD-10-CM | POA: Diagnosis present

## 2014-12-01 DIAGNOSIS — I8501 Esophageal varices with bleeding: Secondary | ICD-10-CM | POA: Diagnosis present

## 2014-12-01 DIAGNOSIS — E039 Hypothyroidism, unspecified: Secondary | ICD-10-CM | POA: Diagnosis present

## 2014-12-01 DIAGNOSIS — I2510 Atherosclerotic heart disease of native coronary artery without angina pectoris: Secondary | ICD-10-CM | POA: Diagnosis present

## 2014-12-01 DIAGNOSIS — K219 Gastro-esophageal reflux disease without esophagitis: Secondary | ICD-10-CM | POA: Diagnosis present

## 2014-12-01 LAB — CBC
HCT: 26.2 % — ABNORMAL LOW (ref 35.0–47.0)
HEMOGLOBIN: 8.6 g/dL — AB (ref 12.0–16.0)
MCH: 31.2 pg (ref 26.0–34.0)
MCHC: 32.8 g/dL (ref 32.0–36.0)
MCV: 95.3 fL (ref 80.0–100.0)
Platelets: 246 10*3/uL (ref 150–440)
RBC: 2.75 MIL/uL — ABNORMAL LOW (ref 3.80–5.20)
RDW: 16.5 % — ABNORMAL HIGH (ref 11.5–14.5)
WBC: 11.3 10*3/uL — ABNORMAL HIGH (ref 3.6–11.0)

## 2014-12-01 LAB — COMPREHENSIVE METABOLIC PANEL
ALT: 25 U/L (ref 14–54)
AST: 79 U/L — ABNORMAL HIGH (ref 15–41)
Albumin: 2.2 g/dL — ABNORMAL LOW (ref 3.5–5.0)
Alkaline Phosphatase: 69 U/L (ref 38–126)
Anion gap: 3 — ABNORMAL LOW (ref 5–15)
BUN: 21 mg/dL — ABNORMAL HIGH (ref 6–20)
CHLORIDE: 104 mmol/L (ref 101–111)
CO2: 22 mmol/L (ref 22–32)
Calcium: 8.5 mg/dL — ABNORMAL LOW (ref 8.9–10.3)
Creatinine, Ser: 0.65 mg/dL (ref 0.44–1.00)
GFR calc Af Amer: >60 mL/min (ref >60)
GFR calc non Af Amer: >60 mL/min (ref >60)
Glucose, Bld: 125 mg/dL — ABNORMAL HIGH (ref 65–99)
Potassium: 3.9 mmol/L (ref 3.5–5.1)
Sodium: 129 mmol/L — ABNORMAL LOW (ref 135–145)
Total Bilirubin: 1.1 mg/dL (ref 0.3–1.2)
Total Protein: 9.7 g/dL — ABNORMAL HIGH (ref 6.5–8.1)

## 2014-12-01 LAB — ABO/RH: ABO/RH(D): A POS

## 2014-12-01 LAB — PREPARE RBC (CROSSMATCH)

## 2014-12-01 LAB — PROTIME-INR
INR: 1.39
PROTHROMBIN TIME: 17.3 s — AB (ref 11.4–15.0)

## 2014-12-01 LAB — APTT: APTT: 35 s (ref 24–36)

## 2014-12-01 MED ORDER — ACETAMINOPHEN 10 MG/ML IV SOLN
500.0000 mg | Freq: Once | INTRAVENOUS | Status: AC
  Administered 2014-12-01: 500 mg via INTRAVENOUS
  Filled 2014-12-01: qty 50

## 2014-12-01 MED ORDER — SODIUM CHLORIDE 0.9 % IV SOLN
INTRAVENOUS | Status: AC
  Administered 2014-12-02: via INTRAVENOUS

## 2014-12-01 MED ORDER — DIPHENHYDRAMINE HCL 25 MG PO CAPS: 25.0000 mg | ORAL_CAPSULE | Freq: Once | ORAL | Status: DC

## 2014-12-01 MED ORDER — ACETAMINOPHEN 325 MG PO TABS: 650.0000 mg | ORAL_TABLET | Freq: Once | ORAL | Status: DC

## 2014-12-01 MED ORDER — SODIUM CHLORIDE 0.9 % IV SOLN
Freq: Once | INTRAVENOUS | Status: DC
  Filled 2014-12-01: qty 1000

## 2014-12-01 MED ORDER — ONDANSETRON HCL 4 MG/2ML IJ SOLN
8.0000 mg | Freq: Once | INTRAMUSCULAR | Status: AC
  Administered 2014-12-01: 8 mg via INTRAVENOUS
  Filled 2014-12-01: qty 4

## 2014-12-01 MED ORDER — SODIUM CHLORIDE 0.9 % IV SOLN
80.0000 mg | Freq: Once | INTRAVENOUS | Status: AC
  Administered 2014-12-01: 80 mg via INTRAVENOUS
  Filled 2014-12-01: qty 80

## 2014-12-01 MED ORDER — ONDANSETRON HCL 4 MG/2ML IJ SOLN
4.0000 mg | Freq: Once | INTRAMUSCULAR | Status: DC | PRN
  Filled 2014-12-01: qty 2

## 2014-12-01 MED ORDER — SODIUM CHLORIDE 0.9 % IV SOLN: 10.0000 mL/h | Freq: Once | INTRAVENOUS | Status: DC

## 2014-12-01 MED ORDER — DIPHENHYDRAMINE HCL 50 MG/ML IJ SOLN
12.5000 mg | Freq: Once | INTRAMUSCULAR | Status: AC
  Administered 2014-12-01: 12.5 mg via INTRAVENOUS
  Filled 2014-12-01: qty 1

## 2014-12-01 MED ORDER — PROMETHAZINE HCL 25 MG/ML IJ SOLN
12.5000 mg | Freq: Four times a day (QID) | INTRAMUSCULAR | Status: DC | PRN
  Administered 2014-12-01: 12.5 mg via INTRAVENOUS
  Filled 2014-12-01: qty 1

## 2014-12-01 MED ORDER — OCTREOTIDE LOAD VIA INFUSION: 50.0000 ug | Freq: Once | INTRAVENOUS | Status: DC

## 2014-12-01 MED ORDER — ACETAMINOPHEN 10 MG/ML IV SOLN: 1000.0000 mg | Freq: Once | INTRAVENOUS | Status: DC

## 2014-12-01 MED ORDER — DIPHENHYDRAMINE HCL 50 MG/ML IJ SOLN: 25.0000 mg | Freq: Once | INTRAMUSCULAR | Status: DC

## 2014-12-01 MED ORDER — OCTREOTIDE ACETATE 100 MCG/ML IJ SOLN
100.0000 ug | Freq: Once | INTRAMUSCULAR | Status: AC
  Administered 2014-12-01: 100 ug via INTRAVENOUS
  Filled 2014-12-01 (×2): qty 1

## 2014-12-01 MED ORDER — SODIUM CHLORIDE 0.9 % IV SOLN
25.0000 ug/h | INTRAVENOUS | Status: AC
  Administered 2014-12-01 – 2014-12-03 (×5): 50 ug/h via INTRAVENOUS
  Administered 2014-12-04: 25 ug/h via INTRAVENOUS
  Administered 2014-12-04: 50 ug/h via INTRAVENOUS
  Filled 2014-12-01 (×15): qty 1

## 2014-12-01 NOTE — ED Notes (Signed)
Pt reports vomiting that began today, pt states emesis was black and now is bright red in appearance -pt admits to "problems with her liver" and takes aspirin daily. Pt denies diarrhea.

## 2014-12-01 NOTE — ED Notes (Signed)
No vomiting since zofran BM black at the toliet.

## 2014-12-01 NOTE — Consult Note (Signed)
Pt is a 64 y/o WF with hx of cirrhosis, possibly related to medication side effects for rheumatoid arthritis drugs.  Pt had EGD and colonoscopy in April with Dr. Deatra Ina and is followed by Dr. Jannifer Hick at [facility].   Pt had onset of dark coffee ground hematemesis and melena in stool today, followed by 2 spells of vomiting fresher blood in ER.  Hgd 10. BP 106/49 P. 56  Pt denies any kidney disease, no MI, does have blockages in heart and on ASA 325 for this, told veins too small to have stents.  Is on BP med.  Previous shoulder and arm surgery.  Non drinker, smoked for 40 years 1/2 PPD  WF in NAD, color looks good, eyes conjunctiva pink, tongue pink, neck veins seen lying down, chest clear, heart RRR mild bradycardia, abd with fullness RUQ.  Non distended., ext no edema.  Hgb 8.6, plt ct 246, PT 17.3 BUN 21, Creatinine 0.65  A: UGI bleeding with hematemesis of dark material probable from portal hypertensive gastropathy, second and third spells possible fresh blood from stomach or varices.  Her color looks good and BP OK.  I agree with admission, Octreotide, PPI  IV bid, Zofran q6 hours for 48 hours.  Would not transfuse unless below 7.5.  Will see her tomorrow  Gaylyn Cheers, MD

## 2014-12-01 NOTE — ED Notes (Addendum)
Dr Vira Agar MD at bedside. Holding transfusion

## 2014-12-01 NOTE — ED Notes (Signed)
pharmacy called for MED's

## 2014-12-01 NOTE — ED Notes (Signed)
Pt vomiting blood small amount

## 2014-12-01 NOTE — H&P (Addendum)
[facility] [place] NAME: Jean Tucker    MR#:  [id number]  DATE OF BIRTH:  11/30/1950  DATE OF ADMISSION:  12/01/2014  PRIMARY CARE PHYSICIAN: Loura Pardon, MD   REQUESTING/REFERRING PHYSICIAN: [place]  CHIEF COMPLAINT:   Chief Complaint  Patient presents with  . Hematemesis    HISTORY OF PRESENT ILLNESS:  Jean Tucker  is a 64 y.o. female who presents with hematemesis and melena. Patient has a recent diagnosis of esophageal varices due to cirrhosis which is believed to be due to her rheumatoid arthritis and/or corresponding medications. She started vomiting first dark clotted blood earlier today, and then became bright red, and then she noticed it in 2 of her bowel movements as well in the form of black melena. In the ED she sound of a hemoglobin down to about 8 from what seems to be a more normal value recently around 10. She was also noted to have borderline low blood pressure for her. She is not tachycardic, but she has nadolol listed as home medication. GI saw her in the ED and recommended Sandostatin drip and IV Protonix twice a day with scheduled antiemetics to prevent any vomiting. They stated they will see her tomorrow to perform endoscopy. Hospitalists were called for admission.  PAST MEDICAL HISTORY:   Past Medical History  Diagnosis Date  . Allergy   . GERD (gastroesophageal reflux disease)   . CAD (coronary artery disease)   . Hyperlipidemia   . Thyroid disease   . Osteoporosis   . Rheumatoid arthritis   . Esophageal varices 09/22/2014    April, 2016-grade 2-3 varices  . Hypothyroidism     PAST SURGICAL HISTORY:   Past Surgical History  Procedure Laterality Date  . Abdominal hysterectomy  1983    partial,bleeding  . Rotator cuff repair      X 4  . Tendon release    . Carpal tunnel release      X 2, right    SOCIAL HISTORY:   History  Substance Use Topics  . Smoking status: Former Smoker    Types:  Cigarettes    Quit date: 05/23/2009  . Smokeless tobacco: Never Used  . Alcohol Use: No     Comment: Occassionally    FAMILY HISTORY:   Family History  Problem Relation Age of Onset  . Diabetes Mother   . Hypertension Mother   . Alcohol abuse Father   . Ovarian cancer Mother   . Colon cancer Mother   . Colon polyps Mother   . Esophageal cancer Neg Hx   . Gallbladder disease Neg Hx   . Kidney disease Neg Hx     DRUG ALLERGIES:   Allergies  Allergen Reactions  . Codeine Anaphylaxis  . Arava [Leflunomide] Other (See Comments)    Pt states that this medication causes liver damage.   . Atorvastatin Other (See Comments)    Pt states that this medication causes liver damage.   . Humira [Adalimumab] Other (See Comments)    Pt states that this medication causes liver damage.   . Methotrexate Derivatives Other (See Comments)    Pt states that this medication causes liver damage.   . Simvastatin Other (See Comments)    Pt states that this medication causes liver damage and joint pain.     MEDICATIONS AT HOME:   Prior to Admission medications   Medication Sig Start Date End Date Taking? Authorizing Provider  aspirin EC 325 MG  tablet Take 325 mg by mouth daily.   Yes Historical Provider, MD  benzonatate (TESSALON) 200 MG capsule Take 1 capsule (200 mg total) by mouth 3 (three) times daily as needed for cough (swallow whole). Patient taking differently: Take 200 mg by mouth 3 (three) times daily as needed for cough.  06/20/14  Yes Abner Greenspan, MD  clotrimazole-betamethasone (LOTRISONE) cream Apply 1 application topically 2 (two) times daily as needed (for rash).   Yes Historical Provider, MD  levothyroxine (SYNTHROID, LEVOTHROID) 100 MCG tablet Take 1 tablet (100 mcg total) by mouth daily. 08/28/14  Yes Abner Greenspan, MD  nadolol (CORGARD) 40 MG tablet Take 1 tablet (40 mg total) by mouth daily. Patient taking differently: Take 20 mg by mouth daily.  08/22/14  Yes Inda Castle, MD   omeprazole (PRILOSEC) 20 MG capsule Take 1 capsule (20 mg total) by mouth daily. Patient taking differently: Take 20 mg by mouth daily as needed (for heartburn/indigestion).  08/28/14  Yes Abner Greenspan, MD  clotrimazole-betamethasone (LOTRISONE) cream APPLY 1 APPLICATION TOPICALLY TWICE A DAY TO AFFECTED AREAS Patient not taking: Reported on 12/01/2014 11/03/14   Abner Greenspan, MD  hydrochlorothiazide (HYDRODIURIL) 25 MG tablet Take 1 tablet (25 mg total) by mouth daily. Patient not taking: Reported on 10/22/2014 06/20/14   Abner Greenspan, MD  iron polysaccharides (NU-IRON) 150 MG capsule Take 1 capsule (150 mg total) by mouth 2 (two) times daily. With food Patient not taking: Reported on 10/22/2014 07/25/14   Abner Greenspan, MD    REVIEW OF SYSTEMS:  Review of Systems  Constitutional: Negative for fever, chills, weight loss and malaise/fatigue.  HENT: Negative for ear pain, hearing loss and tinnitus.   Eyes: Negative for blurred vision, double vision, pain and redness.  Respiratory: Negative for cough, hemoptysis and shortness of breath.   Cardiovascular: Negative for chest pain, palpitations, orthopnea and leg swelling.  Gastrointestinal: Positive for nausea, vomiting and melena. Negative for abdominal pain, diarrhea and constipation.       Hematemesis  Genitourinary: Negative for dysuria, frequency and hematuria.  Musculoskeletal: Negative for back pain, joint pain and neck pain.  Skin:       No acne, rash, or lesions  Neurological: Negative for dizziness, tremors, focal weakness and weakness.  Endo/Heme/Allergies: Negative for polydipsia. Does not bruise/bleed easily.  Psychiatric/Behavioral: Negative for depression. The patient is not nervous/anxious and does not have insomnia.      VITAL SIGNS:   Filed Vitals:   12/01/14 2036 12/01/14 2100 12/01/14 2130 12/01/14 2230  BP: 102/55 111/92 106/49 142/78  Pulse: 55 54 56 66  Temp:      TempSrc:      Resp: 22 18 23 12   Height:       Weight:      SpO2: 96% 97% 96% 98%   Wt Readings from Last 3 Encounters:  12/01/14 83.915 kg (185 lb)  11/11/14 85.73 kg (189 lb)  09/22/14 85.73 kg (189 lb)    PHYSICAL EXAMINATION:  Physical Exam  Vitals reviewed. Constitutional: She is oriented to person, place, and time. She appears well-developed and well-nourished. No distress.  HENT:  Head: Normocephalic and atraumatic.  Mouth/Throat: Oropharynx is clear and moist.  Eyes: Conjunctivae and EOM are normal. Pupils are equal, round, and reactive to light. No scleral icterus.  Neck: Normal range of motion. Neck supple. No JVD present. No thyromegaly present.  Cardiovascular: Normal rate, regular rhythm and intact distal pulses.  Exam reveals no gallop  and no friction rub.   No murmur heard. Respiratory: Effort normal and breath sounds normal. No respiratory distress. She has no wheezes. She has no rales.  GI: Soft. Bowel sounds are normal. She exhibits no distension. There is tenderness.  Musculoskeletal: Normal range of motion. She exhibits no edema.  No arthritis, no gout  Lymphadenopathy:    She has no cervical adenopathy.  Neurological: She is alert and oriented to person, place, and time. No cranial nerve deficit.  No dysarthria, no aphasia  Skin: Skin is warm and dry. No rash noted. No erythema.  Psychiatric: She has a normal mood and affect. Her behavior is normal. Judgment and thought content normal.    LABORATORY PANEL:   CBC  Recent Labs Lab 12/01/14 2005  WBC 11.3*  HGB 8.6*  HCT 26.2*  PLT 246   ------------------------------------------------------------------------------------------------------------------  Chemistries   Recent Labs Lab 12/01/14 2005  NA 129*  K 3.9  CL 104  CO2 22  GLUCOSE 125*  BUN 21*  CREATININE 0.65  CALCIUM 8.5*  AST 79*  ALT 25  ALKPHOS 69  BILITOT 1.1    ------------------------------------------------------------------------------------------------------------------  Cardiac Enzymes No results for input(s): TROPONINI in the last 168 hours. ------------------------------------------------------------------------------------------------------------------  RADIOLOGY:  No results found.  EKG:   Orders placed or performed during the hospital encounter of 12/01/14  . ED EKG  . ED EKG    IMPRESSION AND PLAN:  Principal Problem:   GI bleed - patient had another episode of hematemesis with this writer in the room, per GIs recommendations Sandostatin drip started, IV Protonix given, patient will be admitted to ICU, 1 unit of packed red blood cells ordered for transfusion, every 4 hours hemoglobins order for now. GI has been consulted. Active Problems:   Esophageal varices - potential source of her bleed, we will focus on stopping the bleed as above for now, then follow GIs recommendations after her EGD tomorrow. Likely will need to continue on nadolol.   Coronary atherosclerosis - history of the same, partly the justification for ordering one unit of blood transfused now in the setting of active bleeding.   GERD - IV PPI as above   Essential hypertension, benign - hemodynamically stable this time, the running low for her normal blood pressure. Monitor closely   Chronic rheumatic arthritis - some thought that this autoimmune process or perhaps her RA medications contributed to her cirrhosis. Not currently on any RA medications while inpatient.   Hepatic cirrhosis - underlying cause of her portal hypertension and esophageal varices, defer to GIs recommendations.   Hypothyroidism - she will need her Synthroid restarted as soon as she is able to take by mouth.  All the records are reviewed and case discussed with ED provider. Management plans discussed with the patient and/or family.  DVT PROPHYLAXIS: mechanical only  ADMISSION STATUS:  Inpatient  CODE STATUS: Full  TOTAL CRITICAL CARE TIME TAKING CARE OF THIS PATIENT: 50 minutes.    Demetre Monaco FIELDING 12/01/2014, 10:47 PM  [facility]  Office  [phone]  CC: Primary care physician; Loura Pardon, MD

## 2014-12-01 NOTE — ED Notes (Signed)
While in triage pt began vomiting copious amounts of frank blood and c/o light-headedness.

## 2014-12-01 NOTE — ED Provider Notes (Signed)
[facility] Emergency Department Provider Note ____________________________________________  Time seen: Approximately 9:30 PM  I have reviewed the triage vital signs and the nursing notes.   HISTORY  Chief Complaint Hematemesis    HPI Jean Tucker is a 64 y.o. female new history of esophageal varices, grade 3 and 4, gastritis, portal hypertension gastropathy, and nodular liver thought to be due to the rheumatoid arthritis medications who presents with hematemesis since noon today. Her initial episode of emesis between noon and one was black, her next episode at 5:30 PM was a large amount of bright red clots, her third episode was here in the waiting room and was a large amount of gross blood. She also had one episode of melanotic stool here. She has never had a GI bleed or blood transfusion in the past. She has minimal right upper quadrant discomfort. She denies recent fevers. She has been off her rheumatoid arthritis medications recently while undergoing diagnosis of "liver trouble ". She had an upper endoscopy and colonoscopy in April by Dr. 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   Past Medical History  Diagnosis Date  . Allergy   . GERD (gastroesophageal reflux disease)   . CAD (coronary artery disease)   . Hyperlipidemia   . Thyroid disease   . Osteoporosis   . Rheumatoid arthritis   . Esophageal varices 09/22/2014    April, 2016-grade 2-3 varices  . Hypothyroidism     Patient Active Problem List   Diagnosis Date Noted  . Cirrhosis   . Hepatic cirrhosis 09/22/2014  . Esophageal varices 09/22/2014  . Heme positive stool 07/31/2014  . Acute blood loss anemia 07/31/2014  . History of peptic ulcer disease 07/25/2014  . Chronic rheumatic arthritis 07/09/2014  . Hyponatremia 07/08/2014  . Elevated transaminase level 07/08/2014  . Anemia 07/08/2014  . Sinus congestion 07/08/2014  . Hyperglycemia  06/20/2014  . Cough 06/20/2014  . Candidal intertrigo 12/25/2013  . Vitamin D deficiency disease 06/24/2013  . Obesity 06/24/2013  . Essential hypertension, benign 03/24/2013  . Arthralgia 04/04/2012  . Knee pain 04/04/2012  . Hand pain 04/04/2012  . Routine general medical examination at a health care facility 10/13/2011  . CERVICAL RADICULOPATHY, RIGHT 01/28/2010  . 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 Former smoker 01/02/2008  . Hypothyroidism 01/01/2008  . HYPERLIPIDEMIA 01/01/2008  . CORONARY ARTERY DISEASE 01/01/2008  . ALLERGIC RHINITIS 01/01/2008  . GERD 01/01/2008  . FIBROCYSTIC BREAST DISEASE 01/01/2008  . OSTEOPENIA 01/01/2008  . INSOMNIA 01/01/2008  . CONSTIPATION, HX OF 01/01/2008    Past Surgical History  Procedure Laterality Date  . Abdominal hysterectomy  1983    partial,bleeding  . Rotator cuff repair      X 4  . Tendon release    . Carpal tunnel release      X 2, right    Current Outpatient Rx  Name  Route  Sig  Dispense  Refill  . aspirin EC 325 MG tablet   Oral   Take 325 mg by mouth daily.         . benzonatate (TESSALON) 200 MG capsule   Oral   Take 1 capsule (200 mg total) by mouth 3 (three) times daily as needed for cough (swallow whole). Patient taking differently: Take 200 mg by mouth 3 (three) times daily as needed for cough.    30 capsule   1   . clotrimazole-betamethasone (LOTRISONE) cream  Topical   Apply 1 application topically 2 (two) times daily as needed (for rash).         Marland Kitchen levothyroxine (SYNTHROID, LEVOTHROID) 100 MCG tablet   Oral   Take 1 tablet (100 mcg total) by mouth daily.   90 tablet   3   . nadolol (CORGARD) 40 MG tablet   Oral   Take 1 tablet (40 mg total) by mouth daily. Patient taking differently: Take 20 mg by mouth daily.    30 tablet   4   . omeprazole (PRILOSEC) 20 MG capsule   Oral   Take 1 capsule (20 mg total) by mouth daily. Patient taking differently: Take 20 mg by mouth  daily as needed (for heartburn/indigestion).    90 capsule   3   . clotrimazole-betamethasone (LOTRISONE) cream      APPLY 1 APPLICATION TOPICALLY TWICE A DAY TO AFFECTED AREAS Patient not taking: Reported on 12/01/2014   135 g   0   . hydrochlorothiazide (HYDRODIURIL) 25 MG tablet   Oral   Take 1 tablet (25 mg total) by mouth daily. Patient not taking: Reported on 10/22/2014   90 tablet   3   . iron polysaccharides (NU-IRON) 150 MG capsule   Oral   Take 1 capsule (150 mg total) by mouth 2 (two) times daily. With food Patient not taking: Reported on 10/22/2014   60 capsule   3     Allergies Codeine; Arava; Atorvastatin; Humira; Methotrexate derivatives; and Simvastatin  Family History  Problem Relation Age of Onset  . Diabetes Mother   . Hypertension Mother   . Alcohol abuse Father   . Ovarian cancer Mother   . Colon cancer Mother   . Colon polyps Mother   . Esophageal cancer Neg Hx   . Gallbladder disease Neg Hx   . Kidney disease Neg Hx     Social History History  Substance Use Topics  . Smoking status: Former Smoker    Types: Cigarettes    Quit date: 05/23/2009  . Smokeless tobacco: Never Used  . Alcohol Use: No     Comment: Occassionally    Review of Systems Constitutional: No fever/chills Eyes: No visual changes. ENT: No URI Cardiovascular: Denies chest pain. Respiratory: Denies shortness of breath. Gastrointestinal: See history of present illness  Musculoskeletal: Rheumatoid arthritis pain has been tolerable Skin: Easy bruising 10-point ROS otherwise negative.  ____________________________________________   PHYSICAL EXAM:  VITAL SIGNS: ED Triage Vitals  Enc Vitals Group     BP 12/01/14 1937 126/70 mmHg     Pulse Rate 12/01/14 1937 63     Resp 12/01/14 1937 18     Temp 12/01/14 1937 98.1 F (36.7 C)     Temp Source 12/01/14 1937 Oral     SpO2 12/01/14 1937 96 %     Weight 12/01/14 1937 185 lb (83.915 kg)     Height 12/01/14 1937 5\' 2"   (1.575 m)     Head Cir --      Peak Flow --      Pain Score 12/01/14 1935 1     Pain Loc --      Pain Edu? --      Excl. in [place]? --    Constitutional: Alert and oriented. Well appearing and in no acute distress but appears to feel unwell. Eyes: Conjunctivae are slightly pale. PERRL. EOMI. Head: Atraumatic. Nose: No congestion/rhinnorhea. Mouth/Throat: Mucous membranes are moist.  Oropharynx non-erythematous. Neck: No stridor.   Lymphatic: No  cervical lymphadenopathy. Cardiovascular: Normal rate, regular rhythm. Grossly normal heart sounds.  Peripheral pulses 2+ B Respiratory: Normal respiratory effort.  No retractions. Lungs CTAB. Gastrointestinal: Soft, mild diffuse upper abdominal tenderness. No distention. Normal bowel sounds. Rectal: Melanotic stool that is strongly heme positive  Musculoskeletal: No lower extremity tenderness nor edema.  No calf TTP. Neurologic:  Normal speech and language. No gross focal neurologic deficits are appreciated. Speech is normal.  Skin:  Skin is warm, dry and intact. No rash noted. Psychiatric: Mood and affect are normal. Speech and behavior are normal.  ____________________________________________   LABS (all labs ordered are listed, but only abnormal results are displayed)  Labs Reviewed  CBC - Abnormal; Notable for the following:    WBC 11.3 (*)    RBC 2.75 (*)    Hemoglobin 8.6 (*)    HCT 26.2 (*)    RDW 16.5 (*)    All other components within normal limits  PROTIME-INR - Abnormal; Notable for the following:    Prothrombin Time 17.3 (*)    All other components within normal limits  APTT  COMPREHENSIVE METABOLIC PANEL  POC OCCULT BLOOD, ED  TYPE AND SCREEN  PREPARE RBC (CROSSMATCH)  PREPARE RBC (CROSSMATCH)  ABO/RH   ____________________________________________  EKG   Date: 12/01/2014  Rate: 57  Rhythm: normal sinus rhythm  QRS Axis: normal  Intervals: normal  ST/T Wave abnormalities: normal  Conduction Disutrbances:  none  Narrative Interpretation: unremarkable   ____________________________________________  RADIOLOGY  Chest x-ray-NAD ____________________________________________   PROCEDURES  Procedure(s) performed: none  Critical Care performed: CRITICAL CARE Performed by: Ponciano Ort   Total critical care time: 30"  Critical care time was exclusive of separately billable procedures and treating other patients.  Critical care was necessary to treat or prevent imminent or life-threatening deterioration.  Critical care was time spent personally by me on the following activities: development of treatment plan with patient and/or surrogate as well as nursing, discussions with consultants, evaluation of patient's response to treatment, examination of patient, obtaining history from patient or surrogate, ordering and performing treatments and interventions, ordering and review of laboratory studies, ordering and review of radiographic studies, pulse oximetry and re-evaluation of patient's condition.  ____________________________________________   INITIAL IMPRESSION / ASSESSMENT AND PLAN / ED COURSE  Pertinent labs & imaging results that were available during my care of the patient were reviewed by me and considered in my medical decision making (see chart for details).  I counseled, spouse, daughter about risk and benefits of blood transfusion. ----------------------------------------- 9:30PM on 12/01/2014 ----------------------------------------- I spoke with Dr. Vira Agar, GI, who advises octreotide 100 g bolus followed by 50 g per hour infusion. Given significant blood loss agrees with transfusing 1 unit PRBC this time staying 2 units ahead. He will come in to see patient in the ER and advises ICU admission.  ----------------------------------------- 10:14 PM on 12/01/2014 -----------------------------------------  Dr. Vira Agar has seen and evaluated Ms. Dorfman. At this point he  feels she is stable enough to hold off on blood transfusion and advises cycling H&H every 6 hours in the ICU. Dr. Jannifer Franklin with [facility] will admit.  ____________________________________________   FINAL CLINICAL IMPRESSION(S) / ED DIAGNOSES  Final diagnoses:  Esophageal varices with bleeding       Ponciano Ort, MD 12/01/14 2329

## 2014-12-01 NOTE — ED Notes (Signed)
Admitting MD at bedside.

## 2014-12-01 NOTE — Progress Notes (Signed)
   12/01/14 2300  Clinical Encounter Type  Visited With Patient and family together  Visit Type Initial  Spiritual Encounters  Spiritual Needs Prayer  Stress Factors  Patient Stress Factors Health changes   Faith tradition: Christian Status: GI bleed, alert and oriented Age/Sex: 75 yr, female Family: Chaplain introduced pastoral care 24x7 and shared the patient's request for assistance with using the bathroom to the nurses. Her husband was sitting bedside. We had a brief conversation and silent prayers were offered.  Chaplains can be reached at [phone] or by submitting an online request

## 2014-12-02 LAB — BASIC METABOLIC PANEL
Anion gap: 5 (ref 5–15)
BUN: 22 mg/dL — AB (ref 6–20)
CO2: 22 mmol/L (ref 22–32)
Calcium: 8.1 mg/dL — ABNORMAL LOW (ref 8.9–10.3)
Chloride: 105 mmol/L (ref 101–111)
Creatinine, Ser: 0.8 mg/dL (ref 0.44–1.00)
GLUCOSE: 131 mg/dL — AB (ref 65–99)
POTASSIUM: 3.9 mmol/L (ref 3.5–5.1)
Sodium: 132 mmol/L — ABNORMAL LOW (ref 135–145)

## 2014-12-02 LAB — HEMOGLOBIN
HEMOGLOBIN: 8.3 g/dL — AB (ref 12.0–16.0)
Hemoglobin: 8.9 g/dL — ABNORMAL LOW (ref 12.0–16.0)
Hemoglobin: 8.6 g/dL — ABNORMAL LOW (ref 12.0–16.0)
Hemoglobin: 8.8 g/dL — ABNORMAL LOW (ref 12.0–16.0)
Hemoglobin: 9 g/dL — ABNORMAL LOW (ref 12.0–16.0)

## 2014-12-02 LAB — PROTIME-INR
INR: 1.43
PROTHROMBIN TIME: 17.6 s — AB (ref 11.4–15.0)

## 2014-12-02 LAB — CBC
HEMATOCRIT: 27 % — AB (ref 35.0–47.0)
Hemoglobin: 8.9 g/dL — ABNORMAL LOW (ref 12.0–16.0)
MCH: 31.2 pg (ref 26.0–34.0)
MCHC: 33.1 g/dL (ref 32.0–36.0)
MCV: 94.2 fL (ref 80.0–100.0)
PLATELETS: 192 10*3/uL (ref 150–440)
RBC: 2.87 MIL/uL — ABNORMAL LOW (ref 3.80–5.20)
RDW: 16.9 % — ABNORMAL HIGH (ref 11.5–14.5)
WBC: 9 10*3/uL (ref 3.6–11.0)

## 2014-12-02 LAB — GLUCOSE, CAPILLARY
Glucose-Capillary: 128 mg/dL — ABNORMAL HIGH (ref 65–99)
Glucose-Capillary: 103 mg/dL — ABNORMAL HIGH (ref 65–99)

## 2014-12-02 LAB — PREPARE RBC (CROSSMATCH)

## 2014-12-02 LAB — MRSA PCR SCREENING: MRSA by PCR: NEGATIVE

## 2014-12-02 MED ORDER — SODIUM CHLORIDE 0.9 % IV SOLN
INTRAVENOUS | Status: DC
  Administered 2014-12-02 – 2014-12-03 (×2): via INTRAVENOUS

## 2014-12-02 MED ORDER — ONDANSETRON HCL 4 MG/2ML IJ SOLN
4.0000 mg | Freq: Four times a day (QID) | INTRAMUSCULAR | Status: DC
  Administered 2014-12-02 – 2014-12-04 (×9): 4 mg via INTRAVENOUS
  Filled 2014-12-02 (×9): qty 2

## 2014-12-02 MED ORDER — CYCLOBENZAPRINE HCL 10 MG PO TABS: 5.0000 mg | ORAL_TABLET | Freq: Once | ORAL | Status: DC

## 2014-12-02 MED ORDER — PANTOPRAZOLE SODIUM 40 MG IV SOLR
80.0000 mg | Freq: Two times a day (BID) | INTRAVENOUS | Status: DC
  Administered 2014-12-02: 80 mg via INTRAVENOUS
  Filled 2014-12-02 (×3): qty 80

## 2014-12-02 MED ORDER — LORAZEPAM 2 MG/ML IJ SOLN
0.5000 mg | Freq: Once | INTRAMUSCULAR | Status: AC
  Administered 2014-12-02: 0.5 mg via INTRAVENOUS
  Filled 2014-12-02: qty 1

## 2014-12-02 MED ORDER — NADOLOL 40 MG PO TABS
20.0000 mg | ORAL_TABLET | Freq: Every day | ORAL | Status: DC
  Administered 2014-12-02: 20 mg via ORAL
  Filled 2014-12-02: qty 1

## 2014-12-02 MED ORDER — SODIUM CHLORIDE 0.9 % IJ SOLN
3.0000 mL | Freq: Two times a day (BID) | INTRAMUSCULAR | Status: DC
  Administered 2014-12-02 – 2014-12-04 (×6): 3 mL via INTRAVENOUS

## 2014-12-02 MED ORDER — ALPRAZOLAM 0.25 MG PO TABS
0.2500 mg | ORAL_TABLET | Freq: Three times a day (TID) | ORAL | Status: DC
  Administered 2014-12-02 – 2014-12-05 (×7): 0.25 mg via ORAL
  Filled 2014-12-02 (×7): qty 1

## 2014-12-02 MED ORDER — BENZTROPINE MESYLATE 1 MG/ML IJ SOLN
1.0000 mg | Freq: Once | INTRAMUSCULAR | Status: AC
  Administered 2014-12-02: 1 mg via INTRAVENOUS
  Filled 2014-12-02: qty 1

## 2014-12-02 MED ORDER — BENZTROPINE MESYLATE 1 MG/ML IJ SOLN
1.0000 mg | Freq: Once | INTRAMUSCULAR | Status: DC
  Filled 2014-12-02: qty 1

## 2014-12-02 MED ORDER — CEFTRIAXONE SODIUM IN DEXTROSE 20 MG/ML IV SOLN
1.0000 g | INTRAVENOUS | Status: DC
  Administered 2014-12-02: 1 g via INTRAVENOUS
  Filled 2014-12-02 (×2): qty 50

## 2014-12-02 MED ORDER — PANTOPRAZOLE SODIUM 40 MG IV SOLR: 40.0000 mg | Freq: Two times a day (BID) | INTRAVENOUS | Status: DC

## 2014-12-02 MED ORDER — SODIUM CHLORIDE 0.9 % IV SOLN
8.0000 mg/h | INTRAVENOUS | Status: DC
  Administered 2014-12-02 – 2014-12-03 (×2): 8 mg/h via INTRAVENOUS
  Filled 2014-12-02 (×3): qty 80

## 2014-12-02 NOTE — Consult Note (Signed)
I spoke with Dr. Erskine Emery her GI doctor in [place] and on a recent EGD he did she had significant portal hypertensive gastropathy and he thought this was more likely to be the source of her bleeding.  Her esophageal varices were small.  Will proceed with EGD tomorrow and continue current therapy.

## 2014-12-02 NOTE — ED Notes (Signed)
Spoke to Dr. Jannifer Franklin ordered Ativan for muscle spasms. Continue to monitor blood.

## 2014-12-02 NOTE — Consult Note (Signed)
Pt in CCU with UGI bleed and known varices and cirrhosis.  No bleeding since placed in unit last night.  Pt on ice chips and sips of water.  On Octreotide drip. Color looks good, not quite as pink as last night,  Got a unit of blood per hospitalist.  She had a liver biopsy a month ago.  Will do EGD tomorrow and probable banding of varices unless gastric varices seen and in that case would send to medical center. Chest clear, heart 1/6 SEM, abd not distended. BP variable with lowest 11/93 and a high of 170/77.  Pt on nadolol and will restart this for BP and to decrease pressure in venous system.  Discussed with family.  Will let Dr.Kaplan in [place] know she is in hospital..

## 2014-12-02 NOTE — ED Notes (Addendum)
Paging admitting MD concerning twitching that's been going on since he saw her at 2200 and bp dropping after standing

## 2014-12-02 NOTE — Progress Notes (Signed)
Pt refused to use bedpan educated pt about fall risk due to GI bleed diagnosis pt however stated she still wanted to ambulate to use bedside commode. Will continue to monitor and assess pt

## 2014-12-02 NOTE — ED Notes (Signed)
Pt up to the bathroom

## 2014-12-02 NOTE — Progress Notes (Addendum)
RN paged MD due to paitent's complaint of leg twitching (per report from ED RN, twitching started after phenergan given). ED had given ativan but patient reports no relief from that. MD to call RN back with IV medication if one applicable, MD concerned about aggravating GI bleed if PO medication given. MD informed RN that the patient may simply have to wait out twitching.   Addendum at 02:30, MD ordered IV cogentin dose to try and relieve twitching.

## 2014-12-02 NOTE — Progress Notes (Signed)
[facility] [place] NAME: Jean Tucker    MR#:  [id number]  DATE OF BIRTH:  Jul 09, 1950  SUBJECTIVE:  CHIEF COMPLAINT:   Chief Complaint  Patient presents with  . Hematemesis  none while here but feels weak. Having body twitching and would like to try something as nothing so far has worked.  REVIEW OF SYSTEMS:  Review of Systems  Constitutional: Positive for malaise/fatigue. Negative for fever, weight loss and diaphoresis.  HENT: Negative for ear discharge, ear pain, hearing loss, nosebleeds, sore throat and tinnitus.   Eyes: Negative for blurred vision and pain.  Respiratory: Negative for cough, hemoptysis, shortness of breath and wheezing.   Cardiovascular: Negative for chest pain, palpitations, orthopnea and leg swelling.  Gastrointestinal: Positive for nausea, blood in stool and melena. Negative for heartburn, vomiting, abdominal pain, diarrhea and constipation.  Genitourinary: Negative for dysuria, urgency and frequency.  Musculoskeletal: Negative for myalgias and back pain.  Skin: Negative for itching and rash.  Neurological: Positive for weakness. Negative for dizziness, tingling, tremors, focal weakness, seizures and headaches.  Psychiatric/Behavioral: Negative for depression. The patient is not nervous/anxious.    DRUG ALLERGIES:   Allergies  Allergen Reactions  . Codeine Anaphylaxis  . Arava [Leflunomide] Other (See Comments)    Pt states that this medication causes liver damage.   . Atorvastatin Other (See Comments)    Pt states that this medication causes liver damage.   . Humira [Adalimumab] Other (See Comments)    Pt states that this medication causes liver damage.   . Methotrexate Derivatives Other (See Comments)    Pt states that this medication causes liver damage.   . Simvastatin Other (See Comments)    Pt states that this medication causes liver damage and joint pain.    VITALS:  Blood pressure 170/77,  pulse 30, temperature 97.8 F (36.6 C), temperature source Oral, resp. rate 15, height 5\' 2"  (1.575 m), weight 82.3 kg (181 lb 7 oz), SpO2 92 %. PHYSICAL EXAMINATION:  Physical Exam  Constitutional: She is oriented to person, place, and time and well-developed, well-nourished, and in no distress.  HENT:  Head: Normocephalic and atraumatic.  Eyes: Conjunctivae and EOM are normal. Pupils are equal, round, and reactive to light.  Neck: Normal range of motion. Neck supple. No tracheal deviation present. No thyromegaly present.  Cardiovascular: Normal rate, regular rhythm and normal heart sounds.   Pulmonary/Chest: Effort normal and breath sounds normal. No respiratory distress. She has no wheezes. She exhibits no tenderness.  Abdominal: Soft. Bowel sounds are normal. She exhibits no distension. There is no tenderness.  Musculoskeletal: Normal range of motion.  Neurological: She is alert and oriented to person, place, and time. No cranial nerve deficit.  Skin: Skin is warm and dry. No rash noted.  Psychiatric: Mood and affect normal.   LABORATORY PANEL:   CBC  Recent Labs Lab 12/02/14 0609 12/02/14 1007  WBC 9.0  --   HGB 8.9* 8.6*  HCT 27.0*  --   PLT 192  --    ------------------------------------------------------------------------------------------------------------------ Chemistries   Recent Labs Lab 12/01/14 2005 12/02/14 0609  NA 129* 132*  K 3.9 3.9  CL 104 105  CO2 22 22  GLUCOSE 125* 131*  BUN 21* 22*  CREATININE 0.65 0.80  CALCIUM 8.5* 8.1*  AST 79*  --   ALT 25  --   ALKPHOS 69  --   BILITOT 1.1  --    RADIOLOGY:  Dg Chest  1 View  12/01/2014   CLINICAL DATA:  Vomiting blood since this afternoon.  Weakness.  EXAM: CHEST  1 VIEW  COMPARISON:  06/20/2014  FINDINGS: Normal heart size and pulmonary vascularity. No focal airspace disease or consolidation in the lungs. No blunting of costophrenic angles. No pneumothorax. Mediastinal contours appear intact.  Calcification of the aorta. Postoperative change in the right shoulder.  IMPRESSION: No active disease.   Electronically Signed   By: Lucienne Capers M.D.   On: 12/01/2014 23:22   ASSESSMENT AND PLAN:   * GI bleed - continue Sandostatin & IV Protonix drip, s/p 1 unit of packed red blood cells transfusion, appreciate GI input, likely EGD tomorrow. continue nadolol. Hb 8.9 -> 8.6  * Coronary atherosclerosis - history of the same, given one unit of blood transfusion on admission in the setting of active GI bleeding.  *Twitching of body: will try xanax to see if it helps.  * Essential hypertension, benign - hemodynamically stable this time, the running low for her normal blood pressure. Monitor closely while on nadolol  * Chronic rheumatic arthritis - some thought that this autoimmune process or perhaps her RA medications contributed to her cirrhosis. Not currently on any RA medications while inpatient.  * Cirrhosis - underlying cause of her portal hypertension and esophageal varices, defer to GIs recommendations.  * Hypothyroidism - ca resume her Synthroid as soon as she is able to take by mouth.  * GERD - IV PPI as above  All the records are reviewed and case discussed with Care Management/Social Workerr. Management plans discussed with the patient, family and they are in agreement.  CODE STATUS: Full Code  TOTAL TIME TAKING CARE OF THIS PATIENT: 35 minutes.   More than 50% of the time was spent in counseling/coordination of care: YES  POSSIBLE D/C IN 1-2 DAYS, DEPENDING ON CLINICAL CONDITION & GI eval.   Manuella Ghazi, Maybell Misenheimer M.D on 12/02/2014 at 1:24 PM  Between 7am to 6pm - Pager - [phone]  After 6pm go to [email or web address] - password [email or web address]  [facility]  Office  [phone]  CC:  Primary care physician; Loura Pardon, MD

## 2014-12-03 ENCOUNTER — Encounter
Admission: EM | Disposition: A | Discharge status: Home / Self Care | Payer: Self-pay | Source: Home / Self Care | Attending: Internal Medicine

## 2014-12-03 ENCOUNTER — Inpatient Hospital Stay: Payer: BLUE CROSS/BLUE SHIELD | Admitting: Anesthesiology

## 2014-12-03 ENCOUNTER — Encounter: Payer: Self-pay | Admitting: *Deleted

## 2014-12-03 HISTORY — PX: ESOPHAGOGASTRODUODENOSCOPY: SHX5428

## 2014-12-03 LAB — BASIC METABOLIC PANEL
ANION GAP: 1 — AB (ref 5–15)
BUN: 14 mg/dL (ref 6–20)
CALCIUM: 7.4 mg/dL — AB (ref 8.9–10.3)
CO2: 23 mmol/L (ref 22–32)
CREATININE: 0.94 mg/dL (ref 0.44–1.00)
Chloride: 111 mmol/L (ref 101–111)
GFR calc Af Amer: >60 mL/min (ref >60)
GFR calc non Af Amer: >60 mL/min (ref >60)
Glucose, Bld: 105 mg/dL — ABNORMAL HIGH (ref 65–99)
POTASSIUM: 3.4 mmol/L — AB (ref 3.5–5.1)
Sodium: 135 mmol/L (ref 135–145)

## 2014-12-03 LAB — HEMOGLOBIN
Hemoglobin: 7.9 g/dL — ABNORMAL LOW (ref 12.0–16.0)
Hemoglobin: 8.2 g/dL — ABNORMAL LOW (ref 12.0–16.0)

## 2014-12-03 LAB — TYPE AND SCREEN
ABO/RH(D): A POS
ANTIBODY SCREEN: NEGATIVE
UNIT DIVISION: 0
UNIT DIVISION: 0
Unit division: 0

## 2014-12-03 SURGERY — EGD (ESOPHAGOGASTRODUODENOSCOPY)
Anesthesia: General

## 2014-12-03 SURGERY — EGD (ESOPHAGOGASTRODUODENOSCOPY)
Anesthesia: Monitor Anesthesia Care

## 2014-12-03 MED ORDER — PANTOPRAZOLE SODIUM 40 MG PO TBEC
40.0000 mg | DELAYED_RELEASE_TABLET | Freq: Two times a day (BID) | ORAL | Status: DC
  Administered 2014-12-03 – 2014-12-05 (×5): 40 mg via ORAL
  Filled 2014-12-03 (×5): qty 1

## 2014-12-03 MED ORDER — EPHEDRINE SULFATE 50 MG/ML IJ SOLN
INTRAMUSCULAR | Status: DC | PRN
  Administered 2014-12-03: 5 mg via INTRAVENOUS

## 2014-12-03 MED ORDER — LACTATED RINGERS IV SOLN
INTRAVENOUS | Status: DC | PRN
  Administered 2014-12-03: 12:00:00 via INTRAVENOUS

## 2014-12-03 MED ORDER — PROPOFOL 10 MG/ML IV BOLUS
INTRAVENOUS | Status: DC | PRN
  Administered 2014-12-03: 20 mg via INTRAVENOUS
  Administered 2014-12-03: 50 mg via INTRAVENOUS

## 2014-12-03 MED ORDER — CEFTRIAXONE SODIUM IN DEXTROSE 20 MG/ML IV SOLN
1.0000 g | INTRAVENOUS | Status: DC
  Administered 2014-12-03: 1 g via INTRAVENOUS
  Filled 2014-12-03 (×2): qty 50

## 2014-12-03 MED ORDER — NADOLOL 20 MG PO TABS
10.0000 mg | ORAL_TABLET | Freq: Every day | ORAL | Status: DC
  Administered 2014-12-03 – 2014-12-04 (×2): 10 mg via ORAL
  Filled 2014-12-03 (×3): qty 1

## 2014-12-03 MED ORDER — LEVOTHYROXINE SODIUM 100 MCG PO TABS
100.0000 ug | ORAL_TABLET | Freq: Every day | ORAL | Status: DC
  Administered 2014-12-04 – 2014-12-05 (×2): 100 ug via ORAL
  Filled 2014-12-03 (×3): qty 1

## 2014-12-03 NOTE — Progress Notes (Signed)
Jean Tucker at [place] NAME: Jean Tucker    MR#:  [id number]  DATE OF BIRTH:  1951/04/14  SUBJECTIVE:    REVIEW OF SYSTEMS:   ROS Tolerating Diet: Tolerating PT:   DRUG ALLERGIES:   Allergies  Allergen Reactions  . Codeine Anaphylaxis  . Arava [Leflunomide] Other (See Comments)    Pt states that this medication causes liver damage.   . Atorvastatin Other (See Comments)    Pt states that this medication causes liver damage.   . Humira [Adalimumab] Other (See Comments)    Pt states that this medication causes liver damage.   . Methotrexate Derivatives Other (See Comments)    Pt states that this medication causes liver damage.   . Simvastatin Other (See Comments)    Pt states that this medication causes liver damage and joint pain.     VITALS:  Blood pressure 89/37, pulse 47, temperature 97.9 F (36.6 C), temperature source Oral, resp. rate 10, height 5\' 2"  (1.575 m), weight 82.3 kg (181 lb 7 oz), SpO2 95 %.  PHYSICAL EXAMINATION:   Physical Exam  GENERAL:  64 y.o.-year-old patient lying in the bed with no acute distress.  EYES: Pupils equal, round, reactive to light and accommodation. No scleral icterus. Extraocular muscles intact.  HEENT: Head atraumatic, normocephalic. Oropharynx and nasopharynx clear.  NECK:  Supple, no jugular venous distention. No thyroid enlargement, no tenderness.  LUNGS: Normal breath sounds bilaterally, no wheezing, rales, rhonchi. No use of accessory muscles of respiration.  CARDIOVASCULAR: S1, S2 normal. No murmurs, rubs, or gallops.  ABDOMEN: Soft, nontender, nondistended. Bowel sounds present. No organomegaly or mass.  EXTREMITIES: No cyanosis, clubbing or edema b/l.    NEUROLOGIC: Cranial nerves II through XII are intact. No focal Motor or sensory deficits b/l.   PSYCHIATRIC: The patient is alert and oriented x 3.  SKIN: No obvious rash, lesion, or ulcer.    LABORATORY PANEL:    CBC  Recent Labs Lab 12/02/14 0609  12/03/14 0634  WBC 9.0  --   --   HGB 8.9*  < > 8.2*  HCT 27.0*  --   --   PLT 192  --   --   < > = values in this interval not displayed.  Chemistries   Recent Labs Lab 12/01/14 2005 12/02/14 0609  NA 129* 132*  K 3.9 3.9  CL 104 105  CO2 22 22  GLUCOSE 125* 131*  BUN 21* 22*  CREATININE 0.65 0.80  CALCIUM 8.5* 8.1*  AST 79*  --   ALT 25  --   ALKPHOS 69  --   BILITOT 1.1  --     Cardiac Enzymes No results for input(s): TROPONINI in the last 168 hours.  RADIOLOGY:  Dg Chest 1 View  12/01/2014   CLINICAL DATA:  Vomiting blood since this afternoon.  Weakness.  EXAM: CHEST  1 VIEW  COMPARISON:  06/20/2014  FINDINGS: Normal heart size and pulmonary vascularity. No focal airspace disease or consolidation in the lungs. No blunting of costophrenic angles. No pneumothorax. Mediastinal contours appear intact. Calcification of the aorta. Postoperative change in the right shoulder.  IMPRESSION: No active disease.   Electronically Signed   By: Jean Tucker M.D.   On: 12/01/2014 23:22     ASSESSMENT AND PLAN:  64 y.o. female who presents with hematemesis and melena. Patient has a recent diagnosis of esophageal varices due to cirrhosis   * Upper GI bleed suspected  due to portal hypertensive gastropathy with small esophageal varices noted on EGD done in the recent past - continue Sandostatin & IV Protonix drip, s/p 1 unit of packed red blood cells transfusion, appreciate GI input,  EGD today. -continue nadolol. - Hb 8.9 -> 8.6-->7.9--> 1 unit PRBC-->8.2  * Cirrhosis - underlying cause of her portal hypertension and esophageal varices -pt was recently diagnosed in march 2016. 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 Monitor closely while on nadolol  * Chronic rheumatic arthritis - some thought that this autoimmune process or perhaps her RA medications contributed to her cirrhosis. Not currently on any RA medications while inpatient.  * Hypothyroidism - can resume her Synthroid as soon as she is able to take by mouth.  * GERD - IV PPI as above  Management plans discussed with the patient and  in agreement.  CODE STATUS: full  DVT Prophylaxis: TEDs/scd  TOTAL TIME TAKING CARE OF THIS PATIENT: 60minutes.  >50% time spent on counselling and coordination of care   Jean Tucker M.D on 12/03/2014 at 6:59 AM  Between 7am to 6pm - Pager - [phone]  After 6pm go to [email or web address] - password [email or web address] [facility]  [facility]  Office  [phone]  CC: Primary care physician; Jean Pardon, MD

## 2014-12-03 NOTE — Anesthesia Preprocedure Evaluation (Addendum)
Anesthesia Evaluation  Patient identified by MRN, date of birth, ID band Patient awake    Reviewed: Allergy & Precautions, H&P , NPO status , Patient's Chart, lab work & pertinent test results, reviewed documented beta blocker date and time   Airway Mallampati: II  TM Distance: >3 FB Neck ROM: full    Dental no notable dental hx. (+) Teeth Intact   Pulmonary neg pulmonary ROS, former smoker,  breath sounds clear to auscultation  Pulmonary exam normal       Cardiovascular hypertension, + CAD Normal cardiovascular examRhythm:regular Rate:Normal     Neuro/Psych  Neuromuscular disease negative neurological ROS  negative psych ROS   GI/Hepatic Neg liver ROS, GERD-  ,  Endo/Other  Hypothyroidism   Renal/GU negative Renal ROS  negative genitourinary   Musculoskeletal  (+) Arthritis -,   Abdominal   Peds  Hematology negative hematology ROS (+) anemia ,   Anesthesia Other Findings Past Medical History:   Allergy                                                      GERD (gastroesophageal reflux disease)                       CAD (coronary artery disease)                                Hyperlipidemia                                               Thyroid disease                                              Osteoporosis                                                 Rheumatoid arthritis                                         Esophageal varices                              09/22/2014       Comment:April, 2016-grade 2-3 varices   Hypothyroidism                                              GI Bleed, no vomiting since 12/01/14  Reproductive/Obstetrics negative OB ROS                            Anesthesia Physical Anesthesia Plan  ASA: III  Anesthesia Plan: General   Post-op Pain Management:    Induction:   Airway Management Planned:   Additional Equipment:   Intra-op Plan:   Post-operative  Plan:   Informed Consent: I have reviewed the patients History and Physical, chart, labs and discussed the procedure including the risks, benefits and alternatives for the proposed anesthesia with the patient or authorized representative who has indicated his/her understanding and acceptance.   Dental Advisory Given  Plan Discussed with: Anesthesiologist, CRNA and Surgeon  Anesthesia Plan Comments:         Anesthesia Quick Evaluation

## 2014-12-03 NOTE — Consult Note (Signed)
Patient EGD done showing medium size varices and signif portal hypertensive gastropathy.  Antral erosions/ulcerations noted. No active bleeding anywhere.  Will continue iv Octreotide and switch to oral protonix and she is OK to move from the CCU to the floor, start clear liq diet and advance to full liquid as tolerated.  Drop nadolol to 10mg  q day at 5pm.

## 2014-12-03 NOTE — Transfer of Care (Signed)
Immediate Anesthesia Transfer of Care Note  Patient: Jean Tucker  Procedure(s) Performed: Procedure(s): ESOPHAGOGASTRODUODENOSCOPY (EGD) (N/A)  Patient Location: PACU and Endoscopy Unit  Anesthesia Type:General  Level of Consciousness: awake, alert  and oriented  Airway & Oxygen Therapy: Patient Spontanous Breathing and Patient connected to nasal cannula oxygen  Post-op Assessment: Report given to RN and Post -op Vital signs reviewed and stable  Post vital signs: Reviewed and stable  Last Vitals:  Filed Vitals:   12/03/14 1004  BP: 135/66  Pulse: 47  Temp: 35.9 C  Resp: 16    Complications: No apparent anesthesia complications

## 2014-12-03 NOTE — Progress Notes (Signed)
Dr. Vira Agar called and gave order to d/c IVF, d/c protonix drip, to order protonix PO BID 40mg , and to decrease nadolol to 10mg  PO daily at 1700 and to hold for heart rate less than 40.  MD also gave order for clear liquid diet and to advance diet to full liquids as tolerated. MD stated "im okay with her being moved to the floor today also."  Jean Tucker B

## 2014-12-03 NOTE — Progress Notes (Signed)
Patient transferred to endoscopy by bed with RN and York Cerise, orderly. Patient on monitor for transport. Husband at bedside.  Jean Rubin, RN endo nurse taking over patient's care.  Asim Gersten B

## 2014-12-03 NOTE — Progress Notes (Signed)
Dr. Posey Pronto present and gave order for patient to be transferred to any floor with tely.

## 2014-12-03 NOTE — Op Note (Signed)
[facility] Gastroenterology Patient Name: Jean Tucker Procedure Date: 12/03/2014 11:44 AM MRN: [id number] Account #: [id number] Date of Birth: [birth year] Admit Type: Inpatient Age: 64 Room: [facility] ENDO ROOM 4 Gender: Female Note Status: Finalized Procedure:         Upper GI endoscopy Indications:       Hematemesis, Melena Providers:         Manya Silvas, MD Referring MD:      Wynelle Fanny. Tower, MD (Referring MD) Medicines:         Propofol per Anesthesia Complications:     No immediate complications. Procedure:         Pre-Anesthesia Assessment:                    - After reviewing the risks and benefits, the patient was                     deemed in satisfactory condition to undergo the procedure.                    After obtaining informed consent, the endoscope was passed                     under direct vision. Throughout the procedure, the                     patient's blood pressure, pulse, and oxygen saturations                     were monitored continuously. The Endoscope was introduced                     through the mouth, and advanced to the second part of                     duodenum. The upper GI endoscopy was accomplished without                     difficulty. The patient tolerated the procedure well. Findings:      Grade II varices were found in the lower third of the esophagus. They       were medium in largest diameter. No adherent clots, no red wale signs,       no evidence of recent bleeding.      Multiple dispersed, medium-sized non-bleeding erosions were found in the       gastric antrum. There were no stigmata of recent bleeding.      One non-bleeding cratered gastric ulcer with no stigmata of bleeding was       found at the incisura. The lesion was 3 mm in largest dimension.      Severe portal hypertensive gastropathy was found in the gastric body.      The examined duodenum was normal. Impression:        - Grade II esophageal  varices.                    - Non-bleeding erosive gastropathy.                    - Gastric ulcer with clean base.                    - Portal hypertensive gastropathy.                    -  Normal examined duodenum.                    - No specimens collected. Recommendation:    - The findings and recommendations were discussed with the                     patient's family. Continue Octreotide and move from the                     unit and start clear liquid and advancee to full liquids,                     discharge in 2 days, avoid all and any NSAID, take                     carafate slurry and PPI. Manya Silvas, MD 12/03/2014 12:06:10 PM This report has been signed electronically. Number of Addenda: 0 Note Initiated On: 12/03/2014 11:44 AM      [facility]

## 2014-12-03 NOTE — Progress Notes (Signed)
A&Ox4. Denies pain. VSS. Sinus brady per cardiac monitor. Tolerating clear liquids therefore will advance diet to full liquids. Watching tv with husband at bedside. No distress noted.  Jean Tucker B

## 2014-12-03 NOTE — Plan of Care (Signed)
Problem: Consults Goal: GI Bleeding Patient Education See Patient Education Module for education specifics.  Outcome: Progressing Patient was educated and verbalized understanding of teaching about EGD.

## 2014-12-03 NOTE — Anesthesia Postprocedure Evaluation (Signed)
  Anesthesia Post-op Note  Patient: Jean Tucker  Procedure(s) Performed: Procedure(s): ESOPHAGOGASTRODUODENOSCOPY (EGD) (N/A)  Anesthesia type:General  Patient location: PACU  Post pain: Pain level controlled  Post assessment: Post-op Vital signs reviewed, Patient's Cardiovascular Status Stable, Respiratory Function Stable, Patent Airway and No signs of Nausea or vomiting  Post vital signs: Reviewed and stable  Last Vitals:  Filed Vitals:   12/03/14 1240  BP: 115/57  Pulse: 47  Temp:   Resp: 15    Level of consciousness: awake, alert  and patient cooperative  Complications: No apparent anesthesia complications

## 2014-12-03 NOTE — Consult Note (Signed)
Discussed case and plans for EGD and probable findings with patient and family., consent obtained

## 2014-12-03 NOTE — Progress Notes (Signed)
Report called to Spicer, RN on [facility] who will be taking over patient's care. Sharyn Lull, RN ICU charge nurse will be responsible for patient's transfer to room 216 since it is shift change at this time.  [name]

## 2014-12-03 NOTE — Progress Notes (Signed)
Patient moved to room 216 by bed with teley monitor on. Patient left ICU alert with no distress noted with Ronalee Belts, orderly.

## 2014-12-04 LAB — BASIC METABOLIC PANEL
Anion gap: 4 — ABNORMAL LOW (ref 5–15)
BUN: 9 mg/dL (ref 6–20)
CHLORIDE: 110 mmol/L (ref 101–111)
CO2: 23 mmol/L (ref 22–32)
Calcium: 7.7 mg/dL — ABNORMAL LOW (ref 8.9–10.3)
Creatinine, Ser: 0.81 mg/dL (ref 0.44–1.00)
GFR calc Af Amer: >60 mL/min (ref >60)
GLUCOSE: 107 mg/dL — AB (ref 65–99)
Potassium: 3.3 mmol/L — ABNORMAL LOW (ref 3.5–5.1)
Sodium: 137 mmol/L (ref 135–145)

## 2014-12-04 MED ORDER — NYSTATIN 100000 UNIT/GM EX CREA
TOPICAL_CREAM | Freq: Two times a day (BID) | CUTANEOUS | Status: DC
  Administered 2014-12-04 – 2014-12-05 (×2): via TOPICAL
  Filled 2014-12-04: qty 15

## 2014-12-04 MED ORDER — ONDANSETRON HCL 4 MG/2ML IJ SOLN: 4.0000 mg | Freq: Four times a day (QID) | INTRAMUSCULAR | Status: DC | PRN

## 2014-12-04 MED ORDER — POTASSIUM CHLORIDE CRYS ER 20 MEQ PO TBCR
20.0000 meq | EXTENDED_RELEASE_TABLET | Freq: Once | ORAL | Status: AC
  Administered 2014-12-04: 20 meq via ORAL
  Filled 2014-12-04: qty 1

## 2014-12-04 NOTE — Consult Note (Signed)
Pt feeling better, wants to go home.  No further bleeding, tapering down the Octreotide.  Should go home on PPI and carafate slurry for her stomach, 1 pill TID dissolved in 1-2 oz water 30-60 min before meals.  Follow up with Dr. Deatra Ina.

## 2014-12-04 NOTE — Progress Notes (Signed)
Notified Dr Fritzi Mandes of pt request for antifungal cream; Dr acknowledged, orders received

## 2014-12-04 NOTE — Progress Notes (Signed)
[facility] [place] NAME: Jean Tucker    MR#:  [id number]  DATE OF BIRTH:  [birth year]  SUBJECTIVE:  No more emesis  REVIEW OF SYSTEMS:   Review of Systems  Constitutional: Positive for malaise/fatigue. Negative for fever, chills and weight loss.  HENT: Negative for ear discharge, ear pain and nosebleeds.   Eyes: Negative for blurred vision, pain and discharge.  Respiratory: Negative for sputum production, shortness of breath, wheezing and stridor.   Cardiovascular: Negative for chest pain, palpitations, orthopnea and PND.  Gastrointestinal: Positive for nausea. Negative for vomiting, abdominal pain and diarrhea.  Genitourinary: Negative for urgency and frequency.  Musculoskeletal: Negative for back pain and joint pain.  Neurological: Positive for weakness. Negative for sensory change, speech change and focal weakness.  Psychiatric/Behavioral: Negative for depression. The patient is not nervous/anxious.   All other systems reviewed and are negative.  Tolerating Diet:yes CLD Tolerating PT: not needed  DRUG ALLERGIES:   Allergies  Allergen Reactions  . Codeine Anaphylaxis  . Arava [Leflunomide] Other (See Comments)    Pt states that this medication causes liver damage.   . Atorvastatin Other (See Comments)    Pt states that this medication causes liver damage.   . Humira [Adalimumab] Other (See Comments)    Pt states that this medication causes liver damage.   . Methotrexate Derivatives Other (See Comments)    Pt states that this medication causes liver damage.   . Simvastatin Other (See Comments)    Pt states that this medication causes liver damage and joint pain.     VITALS:  Blood pressure 137/61, pulse 48, temperature 98 F (36.7 C), temperature source Oral, resp. rate 17, height 5\' 2"  (1.575 m), weight 83.643 kg (184 lb 6.4 oz), SpO2 99 %.  PHYSICAL EXAMINATION:   Physical Exam  GENERAL:  64 y.o.-year-old  patient lying in the bed with no acute distress.  EYES: Pupils equal, round, reactive to light and accommodation. No scleral icterus. Extraocular muscles intact.  HEENT: Head atraumatic, normocephalic. Oropharynx and nasopharynx clear.  NECK:  Supple, no jugular venous distention. No thyroid enlargement, no tenderness.  LUNGS: Normal breath sounds bilaterally, no wheezing, rales, rhonchi. No use of accessory muscles of respiration.  CARDIOVASCULAR: S1, S2 normal. No murmurs, rubs, or gallops.  ABDOMEN: Soft, nontender, nondistended. Bowel sounds present. No organomegaly or mass.  EXTREMITIES: No cyanosis, clubbing or edema b/l.    NEUROLOGIC: Cranial nerves II through XII are intact. No focal Motor or sensory deficits b/l.   PSYCHIATRIC: The patient is alert and oriented x 3.  SKIN: No obvious rash, lesion, or ulcer.    LABORATORY PANEL:   CBC  Recent Labs Lab 12/02/14 0609  12/03/14 0634  WBC 9.0  --   --   HGB 8.9*  < > 8.2*  HCT 27.0*  --   --   PLT 192  --   --   < > = values in this interval not displayed.  Chemistries   Recent Labs Lab 12/01/14 2005  12/04/14 0453  NA 129*  < > 137  K 3.9  < > 3.3*  CL 104  < > 110  CO2 22  < > 23  GLUCOSE 125*  < > 107*  BUN 21*  < > 9  CREATININE 0.65  < > 0.81  CALCIUM 8.5*  < > 7.7*  AST 79*  --   --   ALT 25  --   --  ALKPHOS 69  --   --   BILITOT 1.1  --   --   < > = values in this interval not displayed.  Cardiac Enzymes No results for input(s): TROPONINI in the last 168 hours.  RADIOLOGY:  No results found.   ASSESSMENT AND PLAN:  64 y.o. female who presents with hematemesis and melena. Patient has a recent diagnosis of esophageal varices due to cirrhosis   * Upper GI bleed suspected due to portal hypertensive gastropathy with small esophageal varices noted on EGD done in the recent past - continue Sandostatin &po Protonix bid s/p 1 unit of packed red blood cells transfusion, appreciate GI input,  EGD showed  portal HTN   -continue nadolol. - Hb 8.9 -> 8.6-->7.9--> 1 unit PRBC-->8.2  * Cirrhosis - underlying cause of her portal hypertension and esophageal varices -pt was recently diagnosed in march 2016. 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 Monitor closely while on nadolol  * Chronic rheumatic arthritis - some thought that this autoimmune process or perhaps her RA medications contributed to her cirrhosis. Not currently on any RA medications while inpatient.  * Hypothyroidism - can resume her Synthroid as soon as she is able to take by mouth.  * GERD -po PPI as above  Management plans discussed with the patient and  in agreement.  CODE STATUS: full  DVT Prophylaxis: TEDs/scd  TOTAL TIME TAKING CARE OF THIS PATIENT: 35 minutes.  >50% time spent on counselling and coordination of care   Cheryel Kyte M.D on 12/04/2014 at 1:50 PM  Between 7am to 6pm - Pager - [phone]  After 6pm go to [email or web address] - password [email or web address] [facility] [address] [facility]  [facility]  Office  [phone]  CC: Primary care physician; Loura Pardon, MD

## 2014-12-05 MED ORDER — SUCRALFATE 1 G PO TABS: 1.0000 g | ORAL_TABLET | Freq: Three times a day (TID) | ORAL | Status: DC

## 2014-12-05 MED ORDER — PANTOPRAZOLE SODIUM 40 MG PO TBEC: 40.0000 mg | DELAYED_RELEASE_TABLET | Freq: Two times a day (BID) | ORAL | Status: DC

## 2014-12-05 MED ORDER — NADOLOL 40 MG PO TABS: 20.0000 mg | ORAL_TABLET | Freq: Every day | ORAL | Status: DC

## 2014-12-05 NOTE — Discharge Summary (Signed)
[facility] at [place] NAME: Jean Tucker    MR#:  [id number]  DATE OF BIRTH:  10/11/1950  DATE OF ADMISSION:  12/01/2014 ADMITTING PHYSICIAN: Lance Coon, MD  DATE OF DISCHARGE: 12/05/14  PRIMARY CARE PHYSICIAN: Loura Pardon, MD    ADMISSION DIAGNOSIS:  Esophageal varices with bleeding [I85.01]  DISCHARGE DIAGNOSIS:  GI bleed due to portal hypertensive gastropathy Newly diagnosed Cirrhosis of liver  SECONDARY DIAGNOSIS:   Past Medical History  Diagnosis Date  . Allergy   . GERD (gastroesophageal reflux disease)   . CAD (coronary artery disease)   . Hyperlipidemia   . Thyroid disease   . Osteoporosis   . Rheumatoid arthritis   . Esophageal varices 09/22/2014    April, 2016-grade 2-3 varices  . Hypothyroidism     HOSPITAL COURSE:   64 y.o. female who presents with hematemesis and melena. Patient has a recent diagnosis of esophageal varices due to cirrhosis   * Upper GI bleed suspected due to portal hypertensive gastropathy with small esophageal varices noted on EGD done in the recent past -Received IVSandostatin &po Protonix bid s/p 1 unit of packed red blood cells transfusion, appreciate GI input, EGD showed portal HTN  -continue nadolol. - Hb 8.9 -> 8.6-->7.9--> 1 unit PRBC-->8.2 -ppi and carafate  * Cirrhosis - underlying cause of her portal hypertension and esophageal varices -pt was recently diagnosed in march 2016. 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 Monitor closely while on nadolol  * Chronic rheumatic arthritis - some thought that this autoimmune process or perhaps her RA medications contributed to her cirrhosis. Not currently on any RA medications while  inpatient.  * Hypothyroidism - can resume her Synthroid as soon as she is able to take by mouth.  * GERD -po PPI as above  Overall feels better. D/c home  DISCHARGE CONDITIONS:   fair CONSULTS OBTAINED:  Treatment Team:  Manya Silvas, MD  DRUG ALLERGIES:   Allergies  Allergen Reactions  . Codeine Anaphylaxis  . Arava [Leflunomide] Other (See Comments)    Pt states that this medication causes liver damage.   . Atorvastatin Other (See Comments)    Pt states that this medication causes liver damage.   . Humira [Adalimumab] Other (See Comments)    Pt states that this medication causes liver damage.   . Methotrexate Derivatives Other (See Comments)    Pt states that this medication causes liver damage.   . Simvastatin Other (See Comments)    Pt states that this medication causes liver damage and joint pain.     DISCHARGE MEDICATIONS:   Current Discharge Medication List    START taking these medications   Details  pantoprazole (PROTONIX) 40 MG tablet Take 1 tablet (40 mg total) by mouth 2 (two) times daily. Qty: 60 tablet, Refills: 0      CONTINUE these medications which have CHANGED   Details  nadolol (CORGARD) 40 MG tablet Take 0.5 tablets (20 mg total) by mouth daily. Qty: 30 tablet, Refills: 4      CONTINUE these medications which have NOT CHANGED   Details  benzonatate (TESSALON) 200 MG capsule Take 1 capsule (200 mg total) by mouth 3 (three) times daily as needed for cough (swallow whole).  Qty: 30 capsule, Refills: 1    !! clotrimazole-betamethasone (LOTRISONE) cream Apply 1 application topically 2 (two) times daily as needed (for rash).    levothyroxine (SYNTHROID, LEVOTHROID) 100 MCG tablet Take 1 tablet (100 mcg total) by mouth daily. Qty: 90 tablet, Refills: 3    omeprazole (PRILOSEC) 20 MG capsule Take 1 capsule (20 mg total) by mouth daily. Qty: 90 capsule, Refills: 3    !! clotrimazole-betamethasone (LOTRISONE) cream APPLY 1 APPLICATION  TOPICALLY TWICE A DAY TO AFFECTED AREAS Qty: 135 g, Refills: 0    hydrochlorothiazide (HYDRODIURIL) 25 MG tablet Take 1 tablet (25 mg total) by mouth daily. Qty: 90 tablet, Refills: 3    iron polysaccharides (NU-IRON) 150 MG capsule Take 1 capsule (150 mg total) by mouth 2 (two) times daily. With food Qty: 60 capsule, Refills: 3     !! - Potential duplicate medications found. Please discuss with provider.    STOP taking these medications     aspirin EC 325 MG tablet         If you experience worsening of your admission symptoms, develop shortness of breath, life threatening emergency, suicidal or homicidal thoughts you must seek medical attention immediately by calling 911 or calling your MD immediately  if symptoms less severe.  You Must read complete instructions/literature along with all the possible adverse reactions/side effects for all the Medicines you take and that have been prescribed to you. Take any new Medicines after you have completely understood and accept all the possible adverse reactions/side effects.   Please note  You were cared for by a hospitalist during your hospital stay. If you have any questions about your discharge medications or the care you received while you were in the hospital after you are discharged, you can call the unit and asked to speak with the hospitalist on call if the hospitalist that took care of you is not available. Once you are discharged, your primary care physician will handle any further medical issues. Please note that NO REFILLS for any discharge medications will be authorized once you are discharged, as it is imperative that you return to your primary care physician (or establish a relationship with a primary care physician if you do not have one) for your aftercare needs so that they can reassess your need for medications and monitor your lab values. Today   SUBJECTIVE   I am ready to go home  VITAL SIGNS:  Blood pressure 131/61,  pulse 45, temperature 97.5 F (36.4 C), temperature source Oral, resp. rate 16, height 5\' 2"  (1.575 m), weight 83.643 kg (184 lb 6.4 oz), SpO2 99 %.  I/O:   Intake/Output Summary (Last 24 hours) at 12/05/14 1139 Last data filed at 12/05/14 1037  Gross per 24 hour  Intake   1260 ml  Output   2300 ml  Net  -1040 ml    PHYSICAL EXAMINATION:  GENERAL:  64 y.o.-year-old patient lying in the bed with no acute distress.  EYES: Pupils equal, round, reactive to light and accommodation. No scleral icterus. Extraocular muscles intact.  HEENT: Head atraumatic, normocephalic. Oropharynx and nasopharynx clear.  NECK:  Supple, no jugular venous distention. No thyroid enlargement, no tenderness.  LUNGS: Normal breath sounds bilaterally, no wheezing, rales,rhonchi or crepitation. No use of accessory muscles of respiration.  CARDIOVASCULAR: S1, S2 normal. No murmurs, rubs, or gallops.  ABDOMEN: Soft, non-tender, non-distended. Bowel sounds present. No organomegaly or mass.  EXTREMITIES: No pedal edema, cyanosis, or clubbing.  NEUROLOGIC: Cranial nerves II  through XII are intact. Muscle strength 5/5 in all extremities. Sensation intact. Gait not checked.  PSYCHIATRIC: The patient is alert and oriented x 3.  SKIN: No obvious rash, lesion, or ulcer.   DATA REVIEW:   CBC   Recent Labs Lab 12/02/14 0609  12/03/14 0634  WBC 9.0  --   --   HGB 8.9*  < > 8.2*  HCT 27.0*  --   --   PLT 192  --   --   < > = values in this interval not displayed.  Chemistries   Recent Labs Lab 12/01/14 2005  12/04/14 0453  NA 129*  < > 137  K 3.9  < > 3.3*  CL 104  < > 110  CO2 22  < > 23  GLUCOSE 125*  < > 107*  BUN 21*  < > 9  CREATININE 0.65  < > 0.81  CALCIUM 8.5*  < > 7.7*  AST 79*  --   --   ALT 25  --   --   ALKPHOS 69  --   --   BILITOT 1.1  --   --   < > = values in this interval not displayed.  Microbiology Results   Recent Results (from the past 240 hour(s))  MRSA PCR Screening      Status: None   Collection Time: 12/02/14  2:15 AM  Result Value Ref Range Status   MRSA by PCR NEGATIVE NEGATIVE Final    Comment:        The GeneXpert MRSA Assay (FDA approved for NASAL specimens only), is one component of a comprehensive MRSA colonization surveillance program. It is not intended to diagnose MRSA infection nor to guide or monitor treatment for MRSA infections.     RADIOLOGY:  No results found.   Management plans discussed with the patient, family and they are in agreement.  CODE STATUS:     Code Status Orders        Start     Ordered   12/02/14 0200  Full code   Continuous     12/02/14 0200      TOTAL TIME TAKING CARE OF THIS PATIENT: 40 minutes.    Rosezetta Balderston M.D on 12/05/2014 at 11:39 AM  Between 7am to 6pm - Pager - [phone] After 6pm go to [email or web address] - password [email or web address] [facility]  [facility]  Office  [phone]  CC: Primary care physician; Loura Pardon, MD

## 2014-12-05 NOTE — Progress Notes (Signed)
Pt d/c to home today.  IV removed intact.  Pt d/c instructions reviewed and all questions and concerns addressed.  Rx printed and given to pt w/medication education reviewed with pt and pt states understanding.  Medication Education printed on D/C paperwork.  Pt has daughter at bedside for transport home.

## 2014-12-05 NOTE — Discharge Instructions (Signed)
Soft diet

## 2014-12-09 ENCOUNTER — Other Ambulatory Visit: Payer: Self-pay | Admitting: *Deleted

## 2014-12-09 MED ORDER — LEVOTHYROXINE SODIUM 100 MCG PO TABS: 100.0000 ug | ORAL_TABLET | Freq: Every day | ORAL | Status: AC

## 2014-12-09 MED ORDER — OMEPRAZOLE 20 MG PO CPDR: 20.0000 mg | DELAYED_RELEASE_CAPSULE | Freq: Every day | ORAL | Status: DC

## 2014-12-09 NOTE — Addendum Note (Signed)
Addended by: Tammi Sou on: 12/09/2014 04:57 PM   Modules accepted: Orders

## 2014-12-12 ENCOUNTER — Encounter: Payer: Self-pay | Admitting: Family Medicine

## 2014-12-12 ENCOUNTER — Ambulatory Visit (INDEPENDENT_AMBULATORY_CARE_PROVIDER_SITE_OTHER): Payer: BLUE CROSS/BLUE SHIELD | Admitting: Family Medicine

## 2014-12-12 VITALS — BP 126/68 | HR 70 | Temp 97.7°F | Ht 62.0 in | Wt 183.4 lb

## 2014-12-12 DIAGNOSIS — I8500 Esophageal varices without bleeding: Secondary | ICD-10-CM | POA: Diagnosis not present

## 2014-12-12 DIAGNOSIS — K7469 Other cirrhosis of liver: Secondary | ICD-10-CM | POA: Diagnosis not present

## 2014-12-12 DIAGNOSIS — D500 Iron deficiency anemia secondary to blood loss (chronic): Secondary | ICD-10-CM | POA: Diagnosis not present

## 2014-12-12 LAB — CBC WITH DIFFERENTIAL/PLATELET
BASOS ABS: 0.1 10*3/uL (ref 0.0–0.1)
BASOS PCT: 1 % (ref 0–1)
EOS PCT: 3 % (ref 0–5)
Eosinophils Absolute: 0.2 10*3/uL (ref 0.0–0.7)
HEMATOCRIT: 25.8 % — AB (ref 36.0–46.0)
HEMOGLOBIN: 8.7 g/dL — AB (ref 12.0–15.0)
Lymphocytes Relative: 36 % (ref 12–46)
Lymphs Abs: 2.3 10*3/uL (ref 0.7–4.0)
MCH: 30.5 pg (ref 26.0–34.0)
MCHC: 33.7 g/dL (ref 30.0–36.0)
MCV: 90.5 fL (ref 78.0–100.0)
MONO ABS: 0.8 10*3/uL (ref 0.1–1.0)
MPV: 12 fL (ref 8.6–12.4)
Monocytes Relative: 13 % — ABNORMAL HIGH (ref 3–12)
Neutro Abs: 3.1 10*3/uL (ref 1.7–7.7)
Neutrophils Relative %: 47 % (ref 43–77)
Platelets: 289 10*3/uL (ref 150–400)
RBC: 2.85 MIL/uL — AB (ref 3.87–5.11)
RDW: 16.3 % — AB (ref 11.5–15.5)
WBC: 6.5 10*3/uL (ref 4.0–10.5)

## 2014-12-12 LAB — COMPREHENSIVE METABOLIC PANEL
ALBUMIN: 2.4 g/dL — AB (ref 3.5–5.2)
ALT: 18 U/L (ref 0–35)
AST: 66 U/L — AB (ref 0–37)
Alkaline Phosphatase: 81 U/L (ref 39–117)
BILIRUBIN TOTAL: 0.9 mg/dL (ref 0.2–1.2)
BUN: 7 mg/dL (ref 6–23)
CHLORIDE: 102 meq/L (ref 96–112)
CO2: 23 mEq/L (ref 19–32)
Calcium: 8.4 mg/dL (ref 8.4–10.5)
Creat: 0.64 mg/dL (ref 0.50–1.10)
Glucose, Bld: 98 mg/dL (ref 70–99)
Potassium: 4.1 mEq/L (ref 3.5–5.3)
Sodium: 134 mEq/L — ABNORMAL LOW (ref 135–145)
Total Protein: 10 g/dL — ABNORMAL HIGH (ref 6.0–8.3)

## 2014-12-12 NOTE — Progress Notes (Signed)
Pre visit review using our clinic review tool, if applicable. No additional management support is needed unless otherwise documented below in the visit note. 

## 2014-12-12 NOTE — Progress Notes (Signed)
Subjective:    Patient ID: Jean Tucker, female    DOB: 01-23-1951, 64 y.o.   MRN: [id number]  HPI Here for f/u of hospitalization from 7/11 to 7/15 hosp for GI bleed due to esophageal varices (from cirrhosis likely auto immune hepatitis)  Still feeling drained / does not feel like doing  Can get around and perform ADLs  Appetite is fair - stomach does hurt after she eats - was inst to eat soft foods   Dg Chest 1 View  12/01/2014   CLINICAL DATA:  Vomiting blood since this afternoon.  Weakness.  EXAM: CHEST  1 VIEW  COMPARISON:  06/20/2014  FINDINGS: Normal heart size and pulmonary vascularity. No focal airspace disease or consolidation in the lungs. No blunting of costophrenic angles. No pneumothorax. Mediastinal contours appear intact. Calcification of the aorta. Postoperative change in the right shoulder.  IMPRESSION: No active disease.   Electronically Signed   By: Lucienne Capers M.D.   On: 12/01/2014 23:22    Goes back to GI next wed   Wt is down 6 lb from 6/16   tx with PPI Has carafate  Also transfusion - Hb went up from 7.9 to 8.2 Has not taken any iron - gives her too much constipation  Lab Results  Component Value Date   WBC 9.0 12/02/2014   HGB 8.2* 12/03/2014   HCT 27.0* 12/02/2014   MCV 94.2 12/02/2014   PLT 192 12/02/2014     nadalol for portal hypertension= did have symptoms on 40 incl low HR- so cut to 20 mg     Chemistry      Component Value Date/Time   NA 137 12/04/2014 0453   K 3.3* 12/04/2014 0453   CL 110 12/04/2014 0453   CO2 23 12/04/2014 0453   BUN 9 12/04/2014 0453   CREATININE 0.81 12/04/2014 0453      Component Value Date/Time   CALCIUM 7.7* 12/04/2014 0453   ALKPHOS 69 12/01/2014 2005   AST 79* 12/01/2014 2005   ALT 25 12/01/2014 2005   BILITOT 1.1 12/01/2014 2005      Lab Results  Component Value Date   ALT 25 12/01/2014   AST 79* 12/01/2014   ALKPHOS 69 12/01/2014   BILITOT 1.1 12/01/2014    Still having problems with  sleeping   Patient Active Problem List   Diagnosis Date Noted  . GI bleed 12/01/2014  . Cirrhosis   . Hepatic cirrhosis 09/22/2014  . Esophageal varices 09/22/2014  . Heme positive stool 07/31/2014  . Acute blood loss anemia 07/31/2014  . History of peptic ulcer disease 07/25/2014  . Chronic rheumatic arthritis 07/09/2014  . Hyponatremia 07/08/2014  . Elevated transaminase level 07/08/2014  . Anemia 07/08/2014  . Sinus congestion 07/08/2014  . Hyperglycemia 06/20/2014  . Cough 06/20/2014  . Candidal intertrigo 12/25/2013  . Vitamin D deficiency disease 06/24/2013  . Obesity 06/24/2013  . Essential hypertension, benign 03/24/2013  . Arthralgia 04/04/2012  . Knee pain 04/04/2012  . Hand pain 04/04/2012  . Routine general medical examination at a health care facility 10/13/2011  . CERVICAL RADICULOPATHY, RIGHT 01/28/2010  . 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 Former smoker 01/02/2008  . Hypothyroidism 01/01/2008  . HYPERLIPIDEMIA 01/01/2008  . Coronary atherosclerosis 01/01/2008  . ALLERGIC RHINITIS 01/01/2008  . GERD 01/01/2008  . FIBROCYSTIC BREAST DISEASE 01/01/2008  . OSTEOPENIA 01/01/2008  . INSOMNIA 01/01/2008  . CONSTIPATION, HX OF 01/01/2008   Past Medical History  Diagnosis Date  .  Allergy   . GERD (gastroesophageal reflux disease)   . CAD (coronary artery disease)   . Hyperlipidemia   . Thyroid disease   . Osteoporosis   . Rheumatoid arthritis   . Esophageal varices 09/22/2014    April, 2016-grade 2-3 varices  . Hypothyroidism    Past Surgical History  Procedure Laterality Date  . Abdominal hysterectomy  1983    partial,bleeding  . Rotator cuff repair      X 4  . Tendon release    . Carpal tunnel release      X 2, right  . Esophagogastroduodenoscopy N/A 12/03/2014    Procedure: ESOPHAGOGASTRODUODENOSCOPY (EGD);  Surgeon: Manya Silvas, MD;  [facility] ENDOSCOPY;  Service: Endoscopy;  Laterality: N/A;   History  Substance Use  Topics  . Smoking status: Former Smoker    Types: Cigarettes    Quit date: 05/23/2009  . Smokeless tobacco: Never Used  . Alcohol Use: No     Comment: Occassionally   Family History  Problem Relation Age of Onset  . Diabetes Mother   . Hypertension Mother   . Alcohol abuse Father   . Ovarian cancer Mother   . Colon cancer Mother   . Colon polyps Mother   . Esophageal cancer Neg Hx   . Gallbladder disease Neg Hx   . Kidney disease Neg Hx    Allergies  Allergen Reactions  . Codeine Anaphylaxis  . Arava [Leflunomide] Other (See Comments)    Pt states that this medication causes liver damage.   . Atorvastatin Other (See Comments)    Pt states that this medication causes liver damage.   . Humira [Adalimumab] Other (See Comments)    Pt states that this medication causes liver damage.   . Methotrexate Derivatives Other (See Comments)    Pt states that this medication causes liver damage.   . Simvastatin Other (See Comments)    Pt states that this medication causes liver damage and joint pain.    Current Outpatient Prescriptions on File Prior to Visit  Medication Sig Dispense Refill  . benzonatate (TESSALON) 200 MG capsule Take 1 capsule (200 mg total) by mouth 3 (three) times daily as needed for cough (swallow whole). (Patient taking differently: Take 200 mg by mouth 3 (three) times daily as needed for cough. ) 30 capsule 1  . clotrimazole-betamethasone (LOTRISONE) cream APPLY 1 APPLICATION TOPICALLY TWICE A DAY TO AFFECTED AREAS 135 g 0  . clotrimazole-betamethasone (LOTRISONE) cream Apply 1 application topically 2 (two) times daily as needed (for rash).    . hydrochlorothiazide (HYDRODIURIL) 25 MG tablet Take 1 tablet (25 mg total) by mouth daily. 90 tablet 3  . iron polysaccharides (NU-IRON) 150 MG capsule Take 1 capsule (150 mg total) by mouth 2 (two) times daily. With food 60 capsule 3  . levothyroxine (SYNTHROID, LEVOTHROID) 100 MCG tablet Take 1 tablet (100 mcg total) by  mouth daily. 90 tablet 3  . nadolol (CORGARD) 40 MG tablet Take 0.5 tablets (20 mg total) by mouth daily. 30 tablet 4  . omeprazole (PRILOSEC) 20 MG capsule Take 1 capsule (20 mg total) by mouth daily. 90 capsule 3  . pantoprazole (PROTONIX) 40 MG tablet Take 1 tablet (40 mg total) by mouth 2 (two) times daily. 60 tablet 0  . sucralfate (CARAFATE) 1 G tablet Take 1 tablet (1 g total) by mouth 4 (four) times daily -  with meals and at bedtime. 60 tablet 0   No current facility-administered medications on file prior  to visit.    Review of Systems Review of Systems  Constitutional: Negative for fever, appetite change, and unexpected weight change. pos for fatigue that is gradually improving  Eyes: Negative for pain and visual disturbance.  Respiratory: Negative for cough and shortness of breath.   Cardiovascular: Negative for cp or palpitations    Gastrointestinal: Negative for nausea, diarrhea and constipation. pos for upper abd pain after eating - this is also improving  Genitourinary: Negative for urgency and frequency.  Skin: Negative for pallor or rash   Neurological: Negative for weakness, light-headedness, numbness and headaches.  Hematological: Negative for adenopathy. Does not bruise/bleed easily.  Psychiatric/Behavioral: Negative for dysphoric mood. The patient is not nervous/anxious.         Objective:   Physical Exam  Constitutional: She appears well-developed and well-nourished. No distress.  obese and well appearing (slt fatigued)  HENT:  Head: Normocephalic and atraumatic.  Mouth/Throat: Oropharynx is clear and moist.  Eyes: Conjunctivae and EOM are normal. Pupils are equal, round, and reactive to light.  Neck: Normal range of motion. Neck supple. No JVD present. Carotid bruit is not present. No thyromegaly present.  Cardiovascular: Normal rate, regular rhythm, normal heart sounds and intact distal pulses.  Exam reveals no gallop.   Pulmonary/Chest: Effort normal and  breath sounds normal. No respiratory distress. She has no wheezes. She has no rales.  No crackles  Abdominal: Soft. Bowel sounds are normal. She exhibits no distension, no abdominal bruit and no mass. There is tenderness. There is no rebound and no guarding.  Mild epigastric tenderness   Musculoskeletal: She exhibits no edema.  Lymphadenopathy:    She has no cervical adenopathy.  Neurological: She is alert. She has normal reflexes.  Skin: Skin is warm and dry. No rash noted.  Pallor has improved   Psychiatric: She has a normal mood and affect.          Assessment & Plan:   Problem List Items Addressed This Visit    Anemia    Re check today - after GI bleed from esophageal varices  Now stable  Had transfusion in the hospital Not taking iron  Does feel tired but getting better       Relevant Orders   CBC with Differential/Platelet (Completed)   Esophageal varices (Chronic)    From cirrhosis Pt tolerating lower dose of nadolol and bp is controlled with nl pulse  For GI f/u soon  No further signs of bleeding Continues soft diet avoiding nuts/seeds  Will alert [place] if any hematemesis or dark stool      Hepatic cirrhosis - Primary (Chronic)    Reviewed hosp records and studies in detail  Pt had varices that bled-now stable  Will f/u with GI - ? If autoimmune in nature  Aware to avoid acetaminophen/etoh GI f/u next week       Relevant Orders   Comprehensive metabolic panel (Completed)

## 2014-12-12 NOTE — Patient Instructions (Signed)
Continue soft diet  You can try some tomato without seeds  Follow up with GI on Wednesday   Blood pressure and pulse are good today

## 2014-12-14 NOTE — Assessment & Plan Note (Signed)
Re check today - after GI bleed from esophageal varices  Now stable  Had transfusion in the hospital Not taking iron  Does feel tired but getting better

## 2014-12-14 NOTE — Assessment & Plan Note (Signed)
Reviewed hosp records and studies in detail  Pt had varices that bled-now stable  Will f/u with GI - ? If autoimmune in nature  Aware to avoid acetaminophen/etoh GI f/u next week

## 2014-12-14 NOTE — Assessment & Plan Note (Signed)
From cirrhosis Pt tolerating lower dose of nadolol and bp is controlled with nl pulse  For GI f/u soon  No further signs of bleeding Continues soft diet avoiding nuts/seeds  Will alert [place] if any hematemesis or dark stool

## 2014-12-17 ENCOUNTER — Telehealth: Payer: Self-pay | Admitting: *Deleted

## 2014-12-17 ENCOUNTER — Ambulatory Visit (INDEPENDENT_AMBULATORY_CARE_PROVIDER_SITE_OTHER): Payer: BLUE CROSS/BLUE SHIELD | Admitting: Gastroenterology

## 2014-12-17 ENCOUNTER — Encounter: Payer: Self-pay | Admitting: Gastroenterology

## 2014-12-17 VITALS — BP 124/72 | HR 56 | Ht 62.0 in | Wt 182.2 lb

## 2014-12-17 DIAGNOSIS — K754 Autoimmune hepatitis: Secondary | ICD-10-CM | POA: Diagnosis not present

## 2014-12-17 DIAGNOSIS — K2901 Acute gastritis with bleeding: Secondary | ICD-10-CM | POA: Diagnosis not present

## 2014-12-17 MED ORDER — ZOLPIDEM TARTRATE 5 MG PO TABS: 5.0000 mg | ORAL_TABLET | Freq: Every evening | ORAL | Status: DC | PRN

## 2014-12-17 MED ORDER — ZOLPIDEM TARTRATE 5 MG PO TABS: 5.0000 mg | ORAL_TABLET | Freq: Every evening | ORAL | Status: AC | PRN

## 2014-12-17 NOTE — Telephone Encounter (Signed)
Pt calling asking for a "low dose sleep aid" per pt she stated Dr. Deatra Ina said she could take a low dose so it won't damage her liver. I advised pt that is was not in Dr. Kennedy Bucker note from today. Pt stated Dr. Glori Bickers could call Dr. Deatra Ina. Please advise

## 2014-12-17 NOTE — Telephone Encounter (Signed)
Pt notified of Dr. Marliss Coots comments. Pt said she has to have her Rxs sent to [facility]. Since it's a controlled sub I would need a new paper Rx printed for a 90 day supply to send them, please advise

## 2014-12-17 NOTE — Assessment & Plan Note (Signed)
Recent GI bleed probably from oral hypertensive gastropathy.  Patient has known esophageal varices.  Recommendations #1 continue omeprazole while discontinuing Carafate and Protonix #2 continue nadolol #3 endoscopy one year

## 2014-12-17 NOTE — Telephone Encounter (Signed)
He gave me the ok for low dose ambien Px written for call in

## 2014-12-17 NOTE — Patient Instructions (Signed)
We will refer you to Dr Virgina Jock at [facility] Follow up in 3 months on 03/10/2015 at 1:45pm

## 2014-12-17 NOTE — Progress Notes (Signed)
      History of Present Illness:  Jean Tucker was recently hospitalized for an acute upper GI bleed area endoscopy, grade 2 varices were seen in addition to gastric erosions, a clean-based gastric ulcer and severe portal hypertensive gastropathy.  Since discharge she's had no further bleeding.  Her main complaint is mild fatigue.  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  She is taking nadolol.    Review of Systems: Pertinent positive and negative review of systems were noted in the above HPI section. All other review of systems were otherwise negative.    Current Medications, Allergies, Past Medical History, Past Surgical History, Family History and Social History were reviewed in [place] record  Vital signs were reviewed in today's medical record. Physical Exam: General: Well developed , well nourished, no acute distress Skin: anicteric.  There are multiple vascular spiders on her chest Head: Normocephalic and atraumatic Eyes:  sclerae anicteric, EOMI Ears: Normal auditory acuity Mouth: No deformity or lesions Lymph Nodes: no lymphadenopathy Lungs: Clear throughout to auscultation Heart: Regular rate and rhythm; no murmurs, rubs or brui: Gastroinestinal:  Soft,  and non distended. No masses, hepatosplenomegaly or hernias noted. Normal Bowel sounds.  There is mild right upper quadrant tenderness Rectal:deferred Musculoskeletal: Symmetrical with no gross deformities  Pulses:  Normal pulses noted Extremities: No clubbing, cyanosis, edema or deformities noted Neurological: Alert oriented x 4, grossly nonfocal Psychological:  Alert and cooperative. Normal mood and affect  See Assessment and Plan under Problem List

## 2014-12-17 NOTE — Assessment & Plan Note (Signed)
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  Recommendations #1 refer to hepatology clinic for consideration of therapy for autoimmune hepatitis

## 2014-12-17 NOTE — Telephone Encounter (Signed)
Rx faxed to [facility].

## 2014-12-17 NOTE — Telephone Encounter (Signed)
Px printed to fax In IN box

## 2014-12-18 ENCOUNTER — Encounter: Payer: Self-pay | Admitting: *Deleted

## 2014-12-26 ENCOUNTER — Telehealth: Payer: Self-pay | Admitting: *Deleted

## 2014-12-26 NOTE — Telephone Encounter (Signed)
Patient has been scheduled on 02/09/2015 at 10:15am patient aware

## 2015-02-26 ENCOUNTER — Telehealth: Payer: Self-pay

## 2015-02-26 NOTE — Telephone Encounter (Signed)
Called pt to pick another physician. Pt has scheduled 3 month f/u with Dr. Silverio Decamp.

## 2015-03-03 ENCOUNTER — Telehealth: Payer: Self-pay

## 2015-03-03 NOTE — Telephone Encounter (Signed)
Cathy with [place] [facility] request status of medical records requested 02/20/15. Advised emailed to [employer] on 02/24/15 and can take 10-16 business days for reply. Gave Cathy contact # [phone] to ck status of records.

## 2015-03-10 ENCOUNTER — Ambulatory Visit: Payer: BLUE CROSS/BLUE SHIELD | Admitting: Gastroenterology

## 2015-04-23 ENCOUNTER — Inpatient Hospital Stay: Payer: BLUE CROSS/BLUE SHIELD

## 2015-04-23 ENCOUNTER — Ambulatory Visit: Payer: BLUE CROSS/BLUE SHIELD | Admitting: Gastroenterology

## 2015-04-23 ENCOUNTER — Inpatient Hospital Stay: Payer: BLUE CROSS/BLUE SHIELD | Admitting: Anesthesiology

## 2015-04-23 ENCOUNTER — Encounter
Admission: EM | Disposition: A | Discharge status: Other Acute Inpatient Hospital | Payer: Self-pay | Source: Home / Self Care | Attending: Internal Medicine

## 2015-04-23 ENCOUNTER — Inpatient Hospital Stay
Admission: EM | Admit: 2015-04-23 | Discharge: 2015-04-24 | DRG: 441 | Payer: BLUE CROSS/BLUE SHIELD | Attending: Internal Medicine | Admitting: Internal Medicine

## 2015-04-23 DIAGNOSIS — Z7982 Long term (current) use of aspirin: Secondary | ICD-10-CM

## 2015-04-23 DIAGNOSIS — I2510 Atherosclerotic heart disease of native coronary artery without angina pectoris: Secondary | ICD-10-CM | POA: Diagnosis present

## 2015-04-23 DIAGNOSIS — K922 Gastrointestinal hemorrhage, unspecified: Secondary | ICD-10-CM | POA: Diagnosis not present

## 2015-04-23 DIAGNOSIS — E8809 Other disorders of plasma-protein metabolism, not elsewhere classified: Secondary | ICD-10-CM | POA: Diagnosis present

## 2015-04-23 DIAGNOSIS — M1990 Unspecified osteoarthritis, unspecified site: Secondary | ICD-10-CM | POA: Diagnosis present

## 2015-04-23 DIAGNOSIS — R579 Shock, unspecified: Secondary | ICD-10-CM | POA: Diagnosis present

## 2015-04-23 DIAGNOSIS — K7460 Unspecified cirrhosis of liver: Secondary | ICD-10-CM | POA: Diagnosis present

## 2015-04-23 DIAGNOSIS — M810 Age-related osteoporosis without current pathological fracture: Secondary | ICD-10-CM | POA: Diagnosis present

## 2015-04-23 DIAGNOSIS — E785 Hyperlipidemia, unspecified: Secondary | ICD-10-CM | POA: Diagnosis present

## 2015-04-23 DIAGNOSIS — E876 Hypokalemia: Secondary | ICD-10-CM | POA: Diagnosis present

## 2015-04-23 DIAGNOSIS — M069 Rheumatoid arthritis, unspecified: Secondary | ICD-10-CM | POA: Diagnosis present

## 2015-04-23 DIAGNOSIS — K259 Gastric ulcer, unspecified as acute or chronic, without hemorrhage or perforation: Secondary | ICD-10-CM | POA: Diagnosis present

## 2015-04-23 DIAGNOSIS — I8511 Secondary esophageal varices with bleeding: Secondary | ICD-10-CM | POA: Diagnosis present

## 2015-04-23 DIAGNOSIS — K766 Portal hypertension: Secondary | ICD-10-CM | POA: Diagnosis present

## 2015-04-23 DIAGNOSIS — I8500 Esophageal varices without bleeding: Secondary | ICD-10-CM | POA: Diagnosis present

## 2015-04-23 DIAGNOSIS — K921 Melena: Secondary | ICD-10-CM | POA: Diagnosis present

## 2015-04-23 DIAGNOSIS — D500 Iron deficiency anemia secondary to blood loss (chronic): Secondary | ICD-10-CM | POA: Diagnosis present

## 2015-04-23 DIAGNOSIS — E039 Hypothyroidism, unspecified: Secondary | ICD-10-CM | POA: Diagnosis present

## 2015-04-23 DIAGNOSIS — K219 Gastro-esophageal reflux disease without esophagitis: Secondary | ICD-10-CM | POA: Diagnosis present

## 2015-04-23 DIAGNOSIS — I8501 Esophageal varices with bleeding: Secondary | ICD-10-CM | POA: Diagnosis not present

## 2015-04-23 DIAGNOSIS — Z888 Allergy status to other drugs, medicaments and biological substances status: Secondary | ICD-10-CM

## 2015-04-23 DIAGNOSIS — K3189 Other diseases of stomach and duodenum: Secondary | ICD-10-CM | POA: Diagnosis present

## 2015-04-23 DIAGNOSIS — Z885 Allergy status to narcotic agent status: Secondary | ICD-10-CM

## 2015-04-23 DIAGNOSIS — R0600 Dyspnea, unspecified: Secondary | ICD-10-CM

## 2015-04-23 DIAGNOSIS — K754 Autoimmune hepatitis: Secondary | ICD-10-CM | POA: Diagnosis present

## 2015-04-23 DIAGNOSIS — Z9911 Dependence on respirator [ventilator] status: Secondary | ICD-10-CM | POA: Diagnosis not present

## 2015-04-23 DIAGNOSIS — Z9189 Other specified personal risk factors, not elsewhere classified: Secondary | ICD-10-CM

## 2015-04-23 DIAGNOSIS — I959 Hypotension, unspecified: Secondary | ICD-10-CM | POA: Diagnosis not present

## 2015-04-23 HISTORY — PX: ESOPHAGOGASTRODUODENOSCOPY: SHX5428

## 2015-04-23 LAB — BLOOD GAS, ARTERIAL
ACID-BASE DEFICIT: 5.7 mmol/L — AB (ref 0.0–2.0)
Allens test (pass/fail): POSITIVE — AB
Bicarbonate: 19.4 mEq/L — ABNORMAL LOW (ref 21.0–28.0)
FIO2: 0.4
Mechanical Rate: 14
O2 SAT: 96.3 %
PCO2 ART: 36 mmHg (ref 32.0–48.0)
PEEP: 5 cmH2O
PH ART: 7.34 — AB (ref 7.350–7.450)
Patient temperature: 37
VT: 500 mL
pO2, Arterial: 89 mmHg (ref 83.0–108.0)

## 2015-04-23 LAB — TRIGLYCERIDES: TRIGLYCERIDES: 72 mg/dL (ref <150)

## 2015-04-23 LAB — COMPREHENSIVE METABOLIC PANEL
ALT: 15 U/L (ref 14–54)
ANION GAP: 7 (ref 5–15)
AST: 51 U/L — AB (ref 15–41)
Albumin: 2.1 g/dL — ABNORMAL LOW (ref 3.5–5.0)
Alkaline Phosphatase: 62 U/L (ref 38–126)
BUN: 13 mg/dL (ref 6–20)
CALCIUM: 8.1 mg/dL — AB (ref 8.9–10.3)
CHLORIDE: 108 mmol/L (ref 101–111)
CO2: 20 mmol/L — AB (ref 22–32)
CREATININE: 0.67 mg/dL (ref 0.44–1.00)
GLUCOSE: 174 mg/dL — AB (ref 65–99)
POTASSIUM: 3.4 mmol/L — AB (ref 3.5–5.1)
Sodium: 135 mmol/L (ref 135–145)
TOTAL PROTEIN: 8 g/dL (ref 6.5–8.1)
Total Bilirubin: 0.7 mg/dL (ref 0.3–1.2)

## 2015-04-23 LAB — CBC
HCT: 20.1 % — ABNORMAL LOW (ref 35.0–47.0)
HCT: 25.5 % — ABNORMAL LOW (ref 35.0–47.0)
HEMOGLOBIN: 6.3 g/dL — AB (ref 12.0–16.0)
Hemoglobin: 8.2 g/dL — ABNORMAL LOW (ref 12.0–16.0)
MCH: 28.4 pg (ref 26.0–34.0)
MCH: 28.2 pg (ref 26.0–34.0)
MCHC: 31.3 g/dL — AB (ref 32.0–36.0)
MCHC: 32.2 g/dL (ref 32.0–36.0)
MCV: 90.5 fL (ref 80.0–100.0)
MCV: 87.4 fL (ref 80.0–100.0)
PLATELETS: 198 10*3/uL (ref 150–440)
Platelets: 238 10*3/uL (ref 150–440)
RBC: 2.21 MIL/uL — ABNORMAL LOW (ref 3.80–5.20)
RBC: 2.92 MIL/uL — ABNORMAL LOW (ref 3.80–5.20)
RDW: 16.5 % — AB (ref 11.5–14.5)
RDW: 16.4 % — AB (ref 11.5–14.5)
WBC: 11.3 10*3/uL — ABNORMAL HIGH (ref 3.6–11.0)
WBC: 12.9 10*3/uL — AB (ref 3.6–11.0)

## 2015-04-23 LAB — PROTIME-INR
INR: 1.56
Prothrombin Time: 18.7 seconds — ABNORMAL HIGH (ref 11.4–15.0)

## 2015-04-23 LAB — PREPARE RBC (CROSSMATCH)

## 2015-04-23 LAB — GLUCOSE, CAPILLARY: Glucose-Capillary: 132 mg/dL — ABNORMAL HIGH (ref 65–99)

## 2015-04-23 SURGERY — EGD (ESOPHAGOGASTRODUODENOSCOPY)
Anesthesia: General

## 2015-04-23 SURGERY — EGD (ESOPHAGOGASTRODUODENOSCOPY)
Anesthesia: Choice

## 2015-04-23 MED ORDER — ROCURONIUM BROMIDE 100 MG/10ML IV SOLN
INTRAVENOUS | Status: DC | PRN
  Administered 2015-04-23: 10 mg via INTRAVENOUS
  Administered 2015-04-23: 20 mg via INTRAVENOUS

## 2015-04-23 MED ORDER — PANTOPRAZOLE SODIUM 40 MG IV SOLR
40.0000 mg | Freq: Once | INTRAVENOUS | Status: AC
  Administered 2015-04-23: 40 mg via INTRAVENOUS
  Filled 2015-04-23: qty 40

## 2015-04-23 MED ORDER — PROPOFOL 10 MG/ML IV BOLUS
INTRAVENOUS | Status: DC | PRN
Start: 2015-04-23 — End: 2015-04-23
  Administered 2015-04-23: 150 mg via INTRAVENOUS

## 2015-04-23 MED ORDER — DEXTROSE 5 % IV SOLN
10.0000 mg | Freq: Once | INTRAVENOUS | Status: AC
Start: 2015-04-23 — End: 2015-04-24
  Administered 2015-04-24: 10 mg via INTRAVENOUS
  Filled 2015-04-23: qty 1

## 2015-04-23 MED ORDER — PROPOFOL 1000 MG/100ML IV EMUL
5.0000 ug/kg/min | INTRAVENOUS | Status: DC
  Administered 2015-04-23: 25 ug/kg/min via INTRAVENOUS

## 2015-04-23 MED ORDER — FENTANYL 2500MCG IN NS 250ML (10MCG/ML) PREMIX INFUSION
25.0000 ug/h | INTRAVENOUS | Status: DC
  Administered 2015-04-23: 50 ug/h via INTRAVENOUS
  Filled 2015-04-23: qty 250

## 2015-04-23 MED ORDER — MIDAZOLAM HCL 2 MG/2ML IJ SOLN: 2.0000 mg | INTRAMUSCULAR | Status: DC | PRN

## 2015-04-23 MED ORDER — MIDAZOLAM HCL 2 MG/2ML IJ SOLN
INTRAMUSCULAR | Status: DC | PRN
  Administered 2015-04-23: 2 mg via INTRAVENOUS
  Administered 2015-04-23 (×2): 1 mg via INTRAVENOUS

## 2015-04-23 MED ORDER — FENTANYL CITRATE (PF) 100 MCG/2ML IJ SOLN
INTRAMUSCULAR | Status: DC | PRN
  Administered 2015-04-23 (×2): 50 ug via INTRAVENOUS

## 2015-04-23 MED ORDER — FENTANYL CITRATE (PF) 100 MCG/2ML IJ SOLN: 25.0000 ug | INTRAMUSCULAR | Status: DC | PRN

## 2015-04-23 MED ORDER — SODIUM CHLORIDE 0.9 % IV BOLUS (SEPSIS)
500.0000 mL | Freq: Once | INTRAVENOUS | Status: AC
  Administered 2015-04-23: 500 mL via INTRAVENOUS

## 2015-04-23 MED ORDER — PHENYLEPHRINE HCL 10 MG/ML IJ SOLN
INTRAMUSCULAR | Status: DC | PRN
  Administered 2015-04-23 (×4): 150 ug via INTRAVENOUS
  Administered 2015-04-23: 300 ug via INTRAVENOUS
  Administered 2015-04-23: 150 ug via INTRAVENOUS

## 2015-04-23 MED ORDER — PANTOPRAZOLE SODIUM 40 MG IV SOLR: 40.0000 mg | Freq: Two times a day (BID) | INTRAVENOUS | Status: DC

## 2015-04-23 MED ORDER — PROPOFOL 1000 MG/100ML IV EMUL
INTRAVENOUS | Status: AC
Start: 2015-04-23 — End: 2015-04-23
  Administered 2015-04-23: 25 ug/kg/min via INTRAVENOUS
  Filled 2015-04-23: qty 100

## 2015-04-23 MED ORDER — OCTREOTIDE LOAD VIA INFUSION
50.0000 ug | Freq: Once | INTRAVENOUS | Status: AC
  Administered 2015-04-23: 50 ug via INTRAVENOUS
  Filled 2015-04-23: qty 25

## 2015-04-23 MED ORDER — SUCCINYLCHOLINE CHLORIDE 20 MG/ML IJ SOLN
INTRAMUSCULAR | Status: DC | PRN
  Administered 2015-04-23: 100 mg via INTRAVENOUS

## 2015-04-23 MED ORDER — ONDANSETRON HCL 4 MG/2ML IJ SOLN
4.0000 mg | Freq: Once | INTRAMUSCULAR | Status: DC | PRN
Start: 2015-04-23 — End: 2015-04-24

## 2015-04-23 MED ORDER — SODIUM CHLORIDE 0.9 % IV SOLN
10.0000 mL/h | Freq: Once | INTRAVENOUS | Status: AC
  Administered 2015-04-23: 10 mL/h via INTRAVENOUS

## 2015-04-23 MED ORDER — LACTATED RINGERS IV SOLN
INTRAVENOUS | Status: DC | PRN
  Administered 2015-04-23: 21:00:00 via INTRAVENOUS

## 2015-04-23 MED ORDER — PROPOFOL 1000 MG/100ML IV EMUL
INTRAVENOUS | Status: AC
  Filled 2015-04-23: qty 100

## 2015-04-23 MED ORDER — GLYCOPYRROLATE 0.2 MG/ML IJ SOLN
INTRAMUSCULAR | Status: DC | PRN
  Administered 2015-04-23: .2 mg via INTRAVENOUS

## 2015-04-23 MED ORDER — MIDAZOLAM HCL 2 MG/2ML IJ SOLN
2.0000 mg | INTRAMUSCULAR | Status: DC | PRN
  Administered 2015-04-23 – 2015-04-24 (×2): 2 mg via INTRAVENOUS
  Filled 2015-04-23 (×2): qty 2

## 2015-04-23 MED ORDER — ONDANSETRON HCL 4 MG PO TABS: 4.0000 mg | ORAL_TABLET | Freq: Four times a day (QID) | ORAL | Status: DC | PRN

## 2015-04-23 MED ORDER — ONDANSETRON HCL 4 MG/2ML IJ SOLN
INTRAMUSCULAR | Status: AC
  Administered 2015-04-23: 4 mg via INTRAVENOUS
  Filled 2015-04-23: qty 2

## 2015-04-23 MED ORDER — FENTANYL CITRATE (PF) 100 MCG/2ML IJ SOLN
50.0000 ug | Freq: Once | INTRAMUSCULAR | Status: AC
  Administered 2015-04-23: 50 ug via INTRAVENOUS

## 2015-04-23 MED ORDER — OCTREOTIDE ACETATE 500 MCG/ML IJ SOLN
25.0000 ug/h | INTRAMUSCULAR | Status: DC
  Administered 2015-04-23 – 2015-04-24 (×2): 50 ug/h via INTRAVENOUS
  Filled 2015-04-23 (×7): qty 1

## 2015-04-23 MED ORDER — SODIUM CHLORIDE 0.9 % IV SOLN
INTRAVENOUS | Status: DC
  Administered 2015-04-23: via INTRAVENOUS

## 2015-04-23 MED ORDER — PANTOPRAZOLE SODIUM 40 MG IV SOLR
8.0000 mg/h | INTRAVENOUS | Status: DC
  Administered 2015-04-23 – 2015-04-24 (×2): 8 mg/h via INTRAVENOUS
  Filled 2015-04-23 (×4): qty 80

## 2015-04-23 MED ORDER — ONDANSETRON HCL 4 MG/2ML IJ SOLN
4.0000 mg | Freq: Four times a day (QID) | INTRAMUSCULAR | Status: DC | PRN
  Administered 2015-04-23 – 2015-04-24 (×2): 4 mg via INTRAVENOUS
  Filled 2015-04-23: qty 2

## 2015-04-23 MED ORDER — SODIUM CHLORIDE 0.9 % IV SOLN
80.0000 mg | Freq: Once | INTRAVENOUS | Status: AC
  Administered 2015-04-23: 80 mg via INTRAVENOUS
  Filled 2015-04-23 (×2): qty 80

## 2015-04-23 MED ORDER — FENTANYL BOLUS VIA INFUSION
50.0000 ug | INTRAVENOUS | Status: DC | PRN
  Administered 2015-04-24 (×2): 50 ug via INTRAVENOUS
  Filled 2015-04-23: qty 50

## 2015-04-23 SURGICAL SUPPLY — 1 items: 6 SHOOTER ×6 IMPLANT

## 2015-04-23 NOTE — Anesthesia Procedure Notes (Signed)
Procedure Name: Intubation Date/Time: 04/23/2015 9:07 PM Performed by: Rosaria Ferries, Caily Rakers Pre-anesthesia Checklist: Patient identified, Emergency Drugs available, Suction available and Patient being monitored Patient Re-evaluated:Patient Re-evaluated prior to inductionOxygen Delivery Method: Circle system utilized Preoxygenation: Pre-oxygenation with 100% oxygen Intubation Type: IV induction Laryngoscope Size: Mac and 3 Grade View: Grade I Tube type: Oral Number of attempts: 1 Secured at: 21 cm Tube secured with: Tape Dental Injury: Teeth and Oropharynx as per pre-operative assessment

## 2015-04-23 NOTE — Transfer of Care (Signed)
Immediate Anesthesia Transfer of Care Note  Patient: Jean Tucker  Procedure(s) Performed: Procedure(s): UPPER ENDOSCOPY (N/A)  Patient Location: ICU  Anesthesia Type:General  Level of Consciousness: sedated  Airway & Oxygen Therapy: Patient remains intubated per anesthesia plan and Patient placed on Ventilator (see vital sign flow sheet for setting)  Post-op Assessment: Report given to RN and Post -op Vital signs reviewed and stable  Post vital signs: Reviewed and unstable  Last Vitals:  Filed Vitals:   04/23/15 1945 04/23/15 2000  BP: 102/46 119/56  Pulse: 72 74  Temp:    Resp: 13 17    Complications: No apparent anesthesia complications

## 2015-04-23 NOTE — Op Note (Signed)
[facility] Patient Name: Jean Tucker Procedure Date: 04/23/2015 8:43 PM MRN: [id number] Account #: [id number] Date of Birth: December 11, 1950 Admit Type: Outpatient Age: 64 Room: [facility] ENDO ROOM 1 Gender: Female Note Status: Finalized Procedure:         Upper GI endoscopy Indications:       Hematemesis, hx of liver cirrhosis and esophageal varices Providers:         Lupita Dawn. Candace Cruise, MD Referring MD:      [name], MD (Referring MD) Medicines:         General Anesthesia Complications:     No immediate complications. Procedure:         Pre-Anesthesia Assessment:                    - Prior to the procedure, a History and Physical was                     performed, and patient medications, allergies and                     sensitivities were reviewed. The patient's tolerance of                     previous anesthesia was reviewed.                    - The risks and benefits of the procedure and the sedation                     options and risks were discussed with the patient. All                     questions were answered and informed consent was obtained.                    - After reviewing the risks and benefits, the patient was                     deemed in satisfactory condition to undergo the procedure.                    After obtaining informed consent, the endoscope was passed                     under direct vision. Throughout the procedure, the                     patient's blood pressure, pulse, and oxygen saturations                     were monitored continuously. The Endoscope was introduced                     through the mouth, and advanced to the second part of                     duodenum. The upper GI endoscopy was accomplished without                     difficulty. The patient tolerated the procedure well. Findings:      Grade III varices were found in the lower half of the esophagus. There       appears to be active bleeding near  GE junction. Due to  large amount of       blood, cannot localize which varix was active bleeding. All the varices       were large in largest diameter. Ten bands were successfully placed with       incomplete eradication of varices. Initially, steady bleeding continued       at the end of the procedure. Finally, bleeding appear to have stopped.      Clotted blood was found in the gastric fundus. Therefore, cannot tell if       pt has gastric varices. Cannot tell if pt has severe portal gastropathy,       which was seen back in July.      The exam was otherwise without abnormality.      Clotted blood was found in the entire duodenum. Impression:        - Grade III esophageal varices. Incompletely eradicated.                     Banded.                    - Clotted blood in the gastric fundus.                    - The examination was otherwise normal.                    - Blood in the entire examined duodenum.                    - No specimens collected. Recommendation:    - Observe patient's clinical course.                    - Continue present medications.                    - The findings and recommendations were discussed with the                     patient's family.                    - Continue to moniter hgb and transfuse more as needed. If                     bleeding does not stop, then will need to transfuse pt to                     a tertiary center for ermergency TIPS. Hulen Luster, MD 04/23/2015 9:57:47 PM This report has been signed electronically. Number of Addenda: 0 Note Initiated On: 04/23/2015 8:43 PM      [facility]

## 2015-04-23 NOTE — H&P (Addendum)
GI Inpatient Consult Note  Reason for Consult: Acute hematemesis.   Attending Requesting Consult:  History of Present Illness: Jean Tucker is a 64 y.o. female with recently diagnosed with liver cirrhosis, possibly from autoimmune hepatitis. Was hospitalized with UGI bleeding in 7/16. Dr. Vira Agar did EGD which showed Grade 2-3 esophageal varices, antral erosions and superficial ulcer, plus severe portal gastropathy. Initially treated with octreotide and then discharged on nadolol daily. Started vomiting gross blood multiple times tonight with severe hypotension. So far, has received 4 units of PRBC and 2 units FFP so far. Pt in trandelenberg position but SBP above 110 now. No pain or nausea now. Initial hgb 6. Does have hx of CAD. Takes ASA daily.   Past Medical History:  Past Medical History  Diagnosis Date  . Allergy   . GERD (gastroesophageal reflux disease)   . CAD (coronary artery disease)   . Hyperlipidemia   . Thyroid disease   . Osteoporosis   . Rheumatoid arthritis ([place])   . Esophageal varices ([place]) 09/22/2014    April, 2016-grade 2-3 varices  . Hypothyroidism   . Cirrhosis ([place])     Problem List: Patient Active Problem List   Diagnosis Date Noted  . Upper GI bleeding 04/23/2015  . Autoimmune hepatitis ([place]) 12/17/2014  . GI bleed 12/01/2014  . Cirrhosis ([place])   . Hepatic cirrhosis ([place]) 09/22/2014  . Esophageal varices ([place]) 09/22/2014  . Heme positive stool 07/31/2014  . Acute blood loss anemia 07/31/2014  . History of peptic ulcer disease 07/25/2014  . Chronic rheumatic arthritis ([place]) 07/09/2014  . Hyponatremia 07/08/2014  . Elevated transaminase level 07/08/2014  . Anemia 07/08/2014  . Sinus congestion 07/08/2014  . Hyperglycemia 06/20/2014  . Cough 06/20/2014  . [place]idal intertrigo 12/25/2013  . Vitamin D deficiency disease 06/24/2013  . Obesity 06/24/2013  . Essential hypertension, benign 03/24/2013  . Arthralgia 04/04/2012  . Knee pain 04/04/2012   . Hand pain 04/04/2012  . Routine general medical examination at a health care facility 10/13/2011  . CERVICAL RADICULOPATHY, RIGHT 01/28/2010  . 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 Former smoker 01/02/2008  . Hypothyroidism 01/01/2008  . HYPERLIPIDEMIA 01/01/2008  . Coronary atherosclerosis 01/01/2008  . ALLERGIC RHINITIS 01/01/2008  . GERD 01/01/2008  . FIBROCYSTIC BREAST DISEASE 01/01/2008  . OSTEOPENIA 01/01/2008  . INSOMNIA 01/01/2008  . CONSTIPATION, HX OF 01/01/2008    Past Surgical History: Past Surgical History  Procedure Laterality Date  . Abdominal hysterectomy  1983    partial,bleeding  . Rotator cuff repair      X 4  . Tendon release    . Carpal tunnel release      X 2, right  . Esophagogastroduodenoscopy N/A 12/03/2014    Procedure: ESOPHAGOGASTRODUODENOSCOPY (EGD);  Surgeon: Manya Silvas, MD;  Location: [facility] ENDOSCOPY;  Service: Endoscopy;  Laterality: N/A;    Allergies: Allergies  Allergen Reactions  . Codeine Anaphylaxis  . Arava [Leflunomide] Other (See Comments)    Pt states that this medication causes liver damage.   . Atorvastatin Other (See Comments)    Pt states that this medication causes liver damage.   . Humira [Adalimumab] Other (See Comments)    Pt states that this medication causes liver damage.   . Methotrexate Derivatives Other (See Comments)    Pt states that this medication causes liver damage.   . Simvastatin Other (See Comments)    Pt states that this medication causes liver damage and joint pain.  Home Medications:  (Not in a hospital admission) Home medication reconciliation was completed with the patient.   Scheduled Inpatient Medications:   . [START ON 04/27/2015] pantoprazole (PROTONIX) IV  40 mg Intravenous Q12H    Continuous Inpatient Infusions:   . octreotide  (SANDOSTATIN)    IV infusion 50 mcg/hr (04/23/15 2040)  . pantoprazole (PROTONIX) IV 80 mg (04/23/15 2040)  . pantoprozole (PROTONIX)  infusion      PRN Inpatient Medications:  ondansetron **OR** ondansetron (ZOFRAN) IV  Family History: family history includes Alcohol abuse in her father; Colon cancer in her mother; Colon polyps in her mother; Diabetes in her mother; Hypertension in her mother; Ovarian cancer in her mother. There is no history of Esophageal cancer, Gallbladder disease, or Kidney disease.  The patient's family history is negative for inflammatory bowel disorders, GI malignancy, or solid organ transplantation.  Social History:   reports that she quit smoking about 5 years ago. Her smoking use included Cigarettes. She has never used smokeless tobacco. She reports that she does not drink alcohol or use illicit drugs. The patient denies ETOH, tobacco, or drug use.   Review of Systems: Constitutional: Weight is stable.  Eyes: No changes in vision. ENT: No oral lesions, sore throat.  GI: see HPI.  Heme/Lymph: No easy bruising.  CV: No chest pain.  GU: No hematuria.  Integumentary: No rashes.  Neuro: No headaches.  Psych: No depression/anxiety.  Endocrine: No heat/cold intolerance.  Allergic/Immunologic: No urticaria.  Resp: No cough, SOB.  Musculoskeletal: No joint swelling.    Physical Examination: BP 119/56 mmHg  Pulse 74  Temp(Src) 97.7 F (36.5 C) (Oral)  Resp 17  Wt 82.555 kg (182 lb)  SpO2 100% Gen: NAD, alert and oriented x 4 HEENT: PEERLA, EOMI, Neck: supple, no JVD or thyromegaly Chest: CTA bilaterally, no wheezes, crackles, or other adventitious sounds CV: RRR, no m/g/c/r Abd: soft, NT, ND, +BS in all four quadrants; no HSM, guarding, ridigity, or rebound tenderness Ext: no edema, well perfused with 2+ pulses, Skin: no rash or lesions noted Lymph: no LAD  Data: Lab Results  Component Value Date   WBC 11.3* 04/23/2015   HGB 6.3* 04/23/2015   HCT 20.1* 04/23/2015   MCV 90.5 04/23/2015   PLT 238 04/23/2015    Recent Labs Lab 04/23/15 1844  HGB 6.3*   Lab Results   Component Value Date   NA 135 04/23/2015   K 3.4* 04/23/2015   CL 108 04/23/2015   CO2 20* 04/23/2015   BUN 13 04/23/2015   CREATININE 0.67 04/23/2015   Lab Results  Component Value Date   ALT 15 04/23/2015   AST 51* 04/23/2015   ALKPHOS 62 04/23/2015   BILITOT 0.7 04/23/2015   No results for input(s): APTT, INR, PTT in the last 168 hours. Assessment/Plan: Jean Tucker is a 64 y.o. female with acute UGI bleeding either from varices, portal gastropathy, or gastric ulcers.  Recommendations: Pt being resuscitated. Octreotide bolus and infusion started. On protonix drip. Will perform emergent EGD after intubation. If variceal bleeding, esophageal banding will be performed. If variceal bleeding cannot be controlled, then pt will need emergency TIPS procedure here or elsewhere.  Thank you for the consult. Please call with questions or concerns.  Azaiah Mello, Lupita Dawn, MD   Addendum; Pt tells me that pt did not f/u with GI after discharge. Was supposed to have appt in GI tomorrow? Also, ran out of nadolol and did not refill. Pt also ran out of daily PPI  and did not refill either. Told pt how critical it is for pt to continue taking these meds in light of recurrent GI bleeding and liver cirrhosis probably indefinitely, esp if she has to take ASA daily. Will need to talk to cardiology about whether it is safe for her to resume ASA in the future.

## 2015-04-23 NOTE — Progress Notes (Signed)
eLink Physician-Brief Progress Note Patient Name: Jean Tucker DOB: 13-Jul-1950 MRN: [id number]   Date of Service  04/23/2015  HPI/Events of Note  64 yo female with PMH of autoimmune cirrhosis and Esophageal varices. Prior bleeding event 11/2014. Not compliant with Nadolol and PPI. Hematemesis >> EGD with blood everywhere. GE junction banded (blindly). Still bleeding post banding. Will need to remain on mechanical ventilation for airway protection. Plan is to transfer to tertiary center is she should bleed again. PCCM is consulted to manage her mechanical ventilation.   eICU Interventions  Will order: 1. Ventilator orders: 40%/PRVC 14/TV 500/P 5. 2. ABG at 11:30 PM. 3. SCD's 4. Portable CXR s/p ETT placement.      Intervention Category Major Interventions: Respiratory failure - evaluation and management  [name] 04/23/2015, 10:37 PM

## 2015-04-23 NOTE — Anesthesia Preprocedure Evaluation (Addendum)
Anesthesia Evaluation  Patient identified by MRN, date of birth, ID band Patient awake    Reviewed: Allergy & Precautions, NPO status , Patient's Chart, lab work & pertinent test results, reviewed documented beta blocker date and time   Airway Mallampati: II  TM Distance: >3 FB     Dental  (+) Chipped   Pulmonary former smoker,           Cardiovascular hypertension, Pt. on medications + CAD       Neuro/Psych  Neuromuscular disease    GI/Hepatic (+) Cirrhosis   Esophageal Varices    , Hepatitis -  Endo/Other  Hypothyroidism   Renal/GU      Musculoskeletal  (+) Arthritis ,   Abdominal   Peds  Hematology  (+) anemia ,   Anesthesia Other Findings Full stomach. Pt understand her aspiration risk is high. Will do RSI. Possible MI in 2004. No stents placed. No need for NTG etc. She understands she can get hypotensive and have a cardiac event. She has received 4 u of blood and platelets. EKG from July reviewed and ok. May have to leave her intubated post op, if bleeding not controlled.  Reproductive/Obstetrics                         Anesthesia Physical Anesthesia Plan  ASA: III  Anesthesia Plan: General   Post-op Pain Management:    Induction: Intravenous  Airway Management Planned: Oral ETT  Additional Equipment:   Intra-op Plan:   Post-operative Plan:   Informed Consent: I have reviewed the patients History and Physical, chart, labs and discussed the procedure including the risks, benefits and alternatives for the proposed anesthesia with the patient or authorized representative who has indicated his/her understanding and acceptance.     Plan Discussed with: CRNA  Anesthesia Plan Comments:         Anesthesia Quick Evaluation

## 2015-04-23 NOTE — Op Note (Signed)
EGD after intubation showed active bleeding with blood everywhere. Appears bleeding coming from GE junction area. Large esophageal varices. Cannot see fundus well due to large blood clots. No mention of gastric varices on previous EGD. Suspect bleeding from one of the esophageal varices though cannot specify which one. Therefore, started placing esophageal banding from GE junction area. Total of 10 bands were placed. Bleeding appears to continue. Needs to continue intubation to protect airway. Would recommend transferring pt to a tertiary center for emergent TIPS if bleeding does not stop.

## 2015-04-23 NOTE — ED Provider Notes (Signed)
[facility] Emergency Department Provider Note  ____________________________________________  Time seen: Approximately 7:11 PM  I have reviewed the triage vital signs and the nursing notes.   HISTORY  Chief Complaint Rectal Bleeding and Hematemesis  history limited by acuity of patient's illness and her stopping the answer questions occasionally when her blood pressure drops   HPI Jean Tucker is a 64 y.o. female who comes in reporting vomiting large amounts of dark red blood at home and in the car on the way here. Family members holding a vomit bag with at least a unit of blood in it the last from the last time she vomited. Patient has a history of esophageal varices and peptic ulcer. Patient also reports black stool. On arrival in the emergency room patient is very pale and laying back in the bed and stops answering questions although she still has her eyes open looking around somewhat. After some IV fluids and blood pressure comes up from 99 patient begins answering questions again. Patient then vomits again good amount of dark red blood and blood pressure drops to 85. CBC release blood is ordered emergency release FFP is ordered level I infuser is an operation now we are getting bigger IV going I have discussed the patient with Dr. Candace Cruise and ordered octreotide bolus and infusion hospitalist is aware as well and will call Dr. Candace Cruise again and ask him if he should do an emergency endoscopy  Past Medical History  Diagnosis Date  . Allergy   . GERD (gastroesophageal reflux disease)   . CAD (coronary artery disease)   . Hyperlipidemia   . Thyroid disease   . Osteoporosis   . Rheumatoid arthritis ([place])   . Esophageal varices ([place]) 09/22/2014    April, 2016-grade 2-3 varices  . Hypothyroidism   . Cirrhosis [facility])     Patient Active Problem List   Diagnosis Date Noted  . Upper GI bleeding 04/23/2015  . Autoimmune hepatitis ([place]) 12/17/2014  . GI bleed 12/01/2014  .  Cirrhosis ([place])   . Hepatic cirrhosis ([place]) 09/22/2014  . Esophageal varices ([place]) 09/22/2014  . Heme positive stool 07/31/2014  . Acute blood loss anemia 07/31/2014  . History of peptic ulcer disease 07/25/2014  . Chronic rheumatic arthritis ([place]) 07/09/2014  . Hyponatremia 07/08/2014  . Elevated transaminase level 07/08/2014  . Anemia 07/08/2014  . Sinus congestion 07/08/2014  . Hyperglycemia 06/20/2014  . Cough 06/20/2014  . [place] intertrigo 12/25/2013  . Vitamin D deficiency disease 06/24/2013  . Obesity 06/24/2013  . Essential hypertension, benign 03/24/2013  . Arthralgia 04/04/2012  . Knee pain 04/04/2012  . Hand pain 04/04/2012  . Routine general medical examination at a health care facility 10/13/2011  . CERVICAL RADICULOPATHY, RIGHT 01/28/2010  . 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 Former smoker 01/02/2008  . Hypothyroidism 01/01/2008  . HYPERLIPIDEMIA 01/01/2008  . Coronary atherosclerosis 01/01/2008  . ALLERGIC RHINITIS 01/01/2008  . GERD 01/01/2008  . FIBROCYSTIC BREAST DISEASE 01/01/2008  . OSTEOPENIA 01/01/2008  . INSOMNIA 01/01/2008  . CONSTIPATION, HX OF 01/01/2008    Past Surgical History  Procedure Laterality Date  . Abdominal hysterectomy  1983    partial,bleeding  . Rotator cuff repair      X 4  . Tendon release    . Carpal tunnel release      X 2, right  . Esophagogastroduodenoscopy N/A 12/03/2014    Procedure: ESOPHAGOGASTRODUODENOSCOPY (EGD);  Surgeon: Manya Silvas, MD;  Location: [facility] ENDOSCOPY;  Service:  Endoscopy;  Laterality: N/A;    No current outpatient prescriptions on file.  Allergies Codeine; Arava; Atorvastatin; Humira; Methotrexate derivatives; and Simvastatin  Family History  Problem Relation Age of Onset  . Diabetes Mother   . Hypertension Mother   . Alcohol abuse Father   . Ovarian cancer Mother   . Colon cancer Mother   . Colon polyps Mother   . Esophageal cancer Neg Hx   . Gallbladder disease  Neg Hx   . Kidney disease Neg Hx     Social History Social History  Substance Use Topics  . Smoking status: Former Smoker    Types: Cigarettes    Quit date: 05/23/2009  . Smokeless tobacco: Never Used  . Alcohol Use: No     Comment: Occassionally    Review of Systems Unable to obtain ____________________________________________   PHYSICAL EXAM:  VITAL SIGNS: ED Triage Vitals  Enc Vitals Group     BP --      Pulse --      Resp --      Temp 04/23/15 1841 97.7 F (36.5 C)     Temp Source 04/23/15 1841 Oral     SpO2 --      Weight 04/23/15 1841 182 lb (82.555 kg)     Height --      Head Cir --      Peak Flow --      Pain Score --      Pain Loc --      Pain Edu? --      Excl. in [place]? --     Constitutional: Pale having near-syncope Eyes: Conjunctivae are pale. PERRL. EOMI. Head: Atraumatic. Nose: Small amount of blood coming from the nose Mouth/Throat: Mucous membranes are moist.  Small amount of blood in the mouth Neck: No stridor. Cardiovascular: Normal rate, regular rhythm. Grossly normal heart sounds.  Good peripheral circulation. Respiratory: Normal respiratory effort.  No retractions. Lungs CTAB. Gastrointestinal: Soft and nontender. No distention. No abdominal bruits. No CVA tenderness. Musculoskeletal: No lower extremity tenderness nor edema.  No joint effusions.. Skin:  Skin is pale, dry and intact. No rash noted.____________________________________________   LABS (all labs ordered are listed, but only abnormal results are displayed)  Labs Reviewed  COMPREHENSIVE METABOLIC PANEL - Abnormal; Notable for the following:    Potassium 3.4 (*)    CO2 20 (*)    Glucose, Bld 174 (*)    Calcium 8.1 (*)    Albumin 2.1 (*)    AST 51 (*)    All other components within normal limits  CBC - Abnormal; Notable for the following:    WBC 11.3 (*)    RBC 2.21 (*)    Hemoglobin 6.3 (*)    HCT 20.1 (*)    MCHC 31.3 (*)    RDW 16.5 (*)    All other components  within normal limits  PROTIME-INR - Abnormal; Notable for the following:    Prothrombin Time 18.7 (*)    All other components within normal limits  BLOOD GAS, ARTERIAL - Abnormal; Notable for the following:    pH, Arterial 7.34 (*)    Bicarbonate 19.4 (*)    Acid-base deficit 5.7 (*)    Allens test (pass/fail) POSITIVE (*)    All other components within normal limits  CBC - Abnormal; Notable for the following:    WBC 12.9 (*)    RBC 2.92 (*)    Hemoglobin 8.2 (*)    HCT 25.5 (*)  RDW 16.4 (*)    All other components within normal limits  GLUCOSE, CAPILLARY - Abnormal; Notable for the following:    Glucose-Capillary 132 (*)    All other components within normal limits  MRSA PCR SCREENING  TRIGLYCERIDES  BASIC METABOLIC PANEL  CBC  CBC  CBC  POC OCCULT BLOOD, ED  TYPE AND SCREEN  PREPARE RBC (CROSSMATCH)  PREPARE FRESH FROZEN PLASMA  PREPARE PLATELET PHERESIS   ____________________________________________  EKG   ____________________________________________  RADIOLOGY   ____________________________________________   PROCEDURES  Patient vomiting up dark red blood in the ER and family member has large amount of blood in the vomit diabetes he reports she vomited just before she got to the ER pale and hypotensive and after some fluids pressure improves. The patient vomits again has more blood come out and becomes pale and hypotensive again patient has a history of ulcers and varices esophageal and cirrhosis we obtain 2 IVs and began transfusing urgency release O+ blood and FFP. Dr. Jenetta Downer the gastroenterologist's consult. And the hospitalist was consult as well patient will go to the OR for endoscopy by Dr. Jenetta Downer octreotide and Protonix are both given octreotide drip is started patient receives 4 units of emergency release blood in the ER as well as 5 fresh frozen plasma. Platelets of been ordered but are not immediately  available  ____________________________________________   INITIAL IMPRESSION / ASSESSMENT AND PLAN / ED COURSE  Pertinent labs & imaging results that were available during my care of the patient were reviewed by me and considered in my medical decision making (see chart for details).   ____________________________________________   FINAL CLINICAL IMPRESSION(S) / ED DIAGNOSES  Final diagnoses:  Gastrointestinal hemorrhage with melena      Nena Polio, MD 04/24/15 [phone]

## 2015-04-23 NOTE — Progress Notes (Signed)
eLink Physician-Brief Progress Note Patient Name: Jean Tucker DOB: July 19, 1950 MRN: [id number]   Date of Service  04/23/2015  HPI/Events of Note  BP drop after propofol bolus per RN Rpt Hb pending > 4 U PRBC  eICU Interventions  PAD protocol  - fent gtt & versed prn Dc propofol NS bolus  Await Hb if more products required     Intervention Category Major Interventions: Hypotension - evaluation and management  Hannelore Bova V. 04/23/2015, 11:23 PM

## 2015-04-23 NOTE — H&P (Signed)
[facility] NAME: Jean Tucker    MR#:  [id number]  DATE OF BIRTH:  Sep 13, 1950  DATE OF ADMISSION:  04/23/2015  PRIMARY CARE PHYSICIAN: Loura Pardon, MD   REQUESTING/REFERRING PHYSICIAN: Dr. Corinna Capra  CHIEF COMPLAINT:   Chief Complaint  Patient presents with  . Rectal Bleeding  . Hematemesis    HISTORY OF PRESENT ILLNESS:  Jean Tucker  is a 64 y.o. 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 Patient had supper after that noted to have profuse vomiting of blood, dizziness. Noted to have black stool for to 3 days. Denies abdominal pain or fever. Patient blood pressure when she came was 90/70. She had multiple episodes of hematemesis in the emergency room. She already got 4 units of packed RBC transfusion. Blood pressure up at this time is 115/40, heart rate 70. Dr. has been contacted for stat GI consult for emergency EGD because of history of cirrhosis, grade 2 esophageal varices found on EGD in July; patient says she is compliant with medications for cirrhosis. She has rheumatoid arthritis history and occasionally arthritis flares up.  PAST MEDICAL HISTORY:   Past Medical History  Diagnosis Date  . Allergy   . GERD (gastroesophageal reflux disease)   . CAD (coronary artery disease)   . Hyperlipidemia   . Thyroid disease   . Osteoporosis   . Rheumatoid arthritis ([place])   . Esophageal varices ([place]) 09/22/2014    April, 2016-grade 2-3 varices  . Hypothyroidism   . Cirrhosis ([place])     PAST SURGICAL HISTOIRY:   Past Surgical History  Procedure Laterality Date  . Abdominal hysterectomy  1983    partial,bleeding  . Rotator cuff repair      X 4  . Tendon release    . Carpal tunnel release      X 2, right  . Esophagogastroduodenoscopy N/A 12/03/2014    Procedure: ESOPHAGOGASTRODUODENOSCOPY (EGD);  Surgeon: Manya Silvas, MD;  Location: [facility] ENDOSCOPY;  Service: Endoscopy;  Laterality: N/A;    SOCIAL HISTORY:   Social History  Substance Use Topics  . Smoking status: Former Smoker    Types: Cigarettes    Quit date: 05/23/2009  . Smokeless tobacco: Never Used  . Alcohol Use: No     Comment: Occassionally    FAMILY HISTORY:   Family History  Problem Relation Age of Onset  . Diabetes Mother   . Hypertension Mother   . Alcohol abuse Father   . Ovarian cancer Mother   . Colon cancer Mother   . Colon polyps Mother   . Esophageal cancer Neg Hx   . Gallbladder disease Neg Hx   . Kidney disease Neg Hx     DRUG ALLERGIES:   Allergies  Allergen Reactions  . Codeine Anaphylaxis  . Arava [Leflunomide] Other (See Comments)    Pt states that this medication causes liver damage.   . Atorvastatin Other (See Comments)    Pt states that this medication causes liver damage.   . Humira [Adalimumab] Other (See Comments)    Pt states that this medication causes liver damage.   . Methotrexate Derivatives Other (See Comments)    Pt states that this medication causes liver damage.   . Simvastatin Other (See Comments)    Pt states that this medication causes liver damage and joint pain.     REVIEW OF SYSTEMS:  CONSTITUTIONAL: Diaphoretic.   EYES: No blurred or double vision. Pale conjunctiva. EARS, NOSE, AND THROAT: No tinnitus or ear pain.  RESPIRATORY: No cough, shortness of breath, wheezing or hemoptysis.  CARDIOVASCULAR: No chest pain, orthopnea, edema.  GASTROINTESTINAL: Blood vomiting. No abdominal pain. GENITOURINARY: No dysuria, hematuria.  ENDOCRINE: No polyuria, nocturia,  HEMATOLOGY: Severe anemia. SKIN: No rash or lesion. MUSCULOSKELETAL: No joint pain or arthritis.   NEUROLOGIC: No tingling, numbness, weakness.  PSYCHIATRY: No anxiety or depression.   MEDICATIONS AT HOME:   Prior to Admission medications   Medication Sig Start Date End Date Taking? Authorizing Provider   aspirin 325 MG tablet Take 325 mg by mouth daily.   Yes Historical Provider, MD  levothyroxine (SYNTHROID, LEVOTHROID) 100 MCG tablet Take 1 tablet (100 mcg total) by mouth daily. 12/09/14  Yes Abner Greenspan, MD  omeprazole (PRILOSEC) 20 MG capsule Take 1 capsule by mouth daily. 12/09/14   Historical Provider, MD  zolpidem (AMBIEN) 5 MG tablet Take 1 tablet (5 mg total) by mouth at bedtime as needed for sleep (caution of sedation). Patient not taking: Reported on 04/23/2015 12/17/14   Abner Greenspan, MD      VITAL SIGNS:  Blood pressure 102/46, pulse 72, temperature 97.7 F (36.5 C), temperature source Oral, resp. rate 13, weight 82.555 kg (182 lb), SpO2 100 %.  PHYSICAL EXAMINATION:  GENERAL:  64 y.o.-year-old patient lying in the bed ,critically ill, pale looking EYES: Pupils equal, round, reactive to light and accommodation. No scleral icterus. Extraocular muscles intact.  HEENT: Head atraumatic, normocephalic. Oropharynx and nasopharynx clear.  NECK:  Supple, no jugular venous distention. No thyroid enlargement, no tenderness.  LUNGS: Normal breath sounds bilaterally, no wheezing, rales,rhonchi or crepitation. No use of accessory muscles of respiration.  CARDIOVASCULAR: S1, S2 normal. No murmurs, rubs, or gallops.  ABDOMEN: Soft, nontender, nondistended. Bowel sounds present. No organomegaly or mass.  EXTREMITIES: No pedal edema, cyanosis, or clubbing.  NEUROLOGIC: Cranial nerves II through XII are intact. Muscle strength 5/5 in all extremities. Sensation intact. Gait not checked.  PSYCHIATRIC: The patient is alert and oriented x 3.  SKIN: No obvious rash, lesion, or ulcer.   LABORATORY PANEL:   CBC  Recent Labs Lab 04/23/15 1844  WBC 11.3*  HGB 6.3*  HCT 20.1*  PLT 238   ------------------------------------------------------------------------------------------------------------------  Chemistries   Recent Labs Lab 04/23/15 1844  NA 135  K 3.4*  CL 108  CO2 20*   GLUCOSE 174*  BUN 13  CREATININE 0.67  CALCIUM 8.1*  AST 51*  ALT 15  ALKPHOS 62  BILITOT 0.7   ------------------------------------------------------------------------------------------------------------------  Cardiac Enzymes No results for input(s): TROPONINI in the last 168 hours. ------------------------------------------------------------------------------------------------------------------  RADIOLOGY:  No results found.  EKG:   Orders placed or performed during the hospital encounter of 12/01/14  . ED EKG  . ED EKG  . EKG 12-Lead  . EKG 12-Lead    IMPRESSION AND PLAN:   #1. Acute upper GI bleed likely due to variceal hemorrhage ; due to portal hypertensive gastropathy, cirrhosis ; admit to ICU, hemodynamic support with IV fluids, blood transfusions, octreotide drip, that the GI consult for emergency EGD and evaluation of grade 2 varices, possible need for banding. #2 acute severe anemia requiring multiple units of transfusion secondary to rapid GI bleed: Will get a central line, transfuse to keep hemoglobin more than 9. 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 Start empiric antibiotics for SBP. #4 history of rheumatoid arthritis stable at this time: #5 hypokalemia replace the potassium. prone for infection secondary to GI bleed, hypoalbuminemia, cirrhosis of the liver start empiric antibiotics. Condition;critical D/w husband All the records are reviewed and case discussed with ED provider. Management plans discussed with the patient, family and they are in agreement.  CODE STATUS: full  TOTAL TIME TAKING CARE OF THIS PATIENT: 65 minutes.crical care time    [facility] M.D on 04/23/2015 at 8:00 PM  Between 7am to 6pm - Pager - [phone]  After 6pm go to [email or web address] - password [email or web address] [facility]  Office   [phone]  CC: Primary care physician; Loura Pardon, MD  Note: This dictation was prepared with Dragon dictation along with smaller phrase technology. Any transcriptional errors that result from this process are unintentional.

## 2015-04-24 ENCOUNTER — Ambulatory Visit (HOSPITAL_COMMUNITY)
Admission: AD | Admit: 2015-04-24 | Discharge: 2015-04-24 | Disposition: A | Discharge status: Home / Self Care | Payer: BLUE CROSS/BLUE SHIELD | Source: Other Acute Inpatient Hospital | Attending: Internal Medicine | Admitting: Internal Medicine

## 2015-04-24 ENCOUNTER — Inpatient Hospital Stay
Admission: AD | Admit: 2015-04-24 | Payer: Self-pay | Source: Other Acute Inpatient Hospital | Admitting: Internal Medicine

## 2015-04-24 ENCOUNTER — Inpatient Hospital Stay: Payer: BLUE CROSS/BLUE SHIELD

## 2015-04-24 ENCOUNTER — Ambulatory Visit: Payer: BLUE CROSS/BLUE SHIELD | Admitting: Family Medicine

## 2015-04-24 ENCOUNTER — Telehealth: Payer: Self-pay | Admitting: Family Medicine

## 2015-04-24 DIAGNOSIS — I8500 Esophageal varices without bleeding: Secondary | ICD-10-CM | POA: Insufficient documentation

## 2015-04-24 DIAGNOSIS — I8501 Esophageal varices with bleeding: Secondary | ICD-10-CM

## 2015-04-24 DIAGNOSIS — K7469 Other cirrhosis of liver: Secondary | ICD-10-CM

## 2015-04-24 DIAGNOSIS — Z9911 Dependence on respirator [ventilator] status: Secondary | ICD-10-CM

## 2015-04-24 DIAGNOSIS — K922 Gastrointestinal hemorrhage, unspecified: Secondary | ICD-10-CM

## 2015-04-24 DIAGNOSIS — I959 Hypotension, unspecified: Secondary | ICD-10-CM

## 2015-04-24 LAB — BASIC METABOLIC PANEL
Anion gap: 1 — ABNORMAL LOW (ref 5–15)
BUN: 15 mg/dL (ref 6–20)
CO2: 21 mmol/L — ABNORMAL LOW (ref 22–32)
Calcium: 7.2 mg/dL — ABNORMAL LOW (ref 8.9–10.3)
Chloride: 111 mmol/L (ref 101–111)
Creatinine, Ser: 0.85 mg/dL (ref 0.44–1.00)
GFR calc Af Amer: >60 mL/min (ref >60)
GLUCOSE: 160 mg/dL — AB (ref 65–99)
POTASSIUM: 6.1 mmol/L — AB (ref 3.5–5.1)
Sodium: 133 mmol/L — ABNORMAL LOW (ref 135–145)

## 2015-04-24 LAB — CBC
HCT: 26.5 % — ABNORMAL LOW (ref 35.0–47.0)
HEMATOCRIT: 26.6 % — AB (ref 35.0–47.0)
HEMOGLOBIN: 8.7 g/dL — AB (ref 12.0–16.0)
Hemoglobin: 8.5 g/dL — ABNORMAL LOW (ref 12.0–16.0)
MCH: 28 pg (ref 26.0–34.0)
MCH: 28.7 pg (ref 26.0–34.0)
MCHC: 32 g/dL (ref 32.0–36.0)
MCHC: 32.8 g/dL (ref 32.0–36.0)
MCV: 87.7 fL (ref 80.0–100.0)
MCV: 87.3 fL (ref 80.0–100.0)
Platelets: 207 10*3/uL (ref 150–440)
Platelets: 224 10*3/uL (ref 150–440)
RBC: 3.04 MIL/uL — ABNORMAL LOW (ref 3.80–5.20)
RBC: 3.04 MIL/uL — ABNORMAL LOW (ref 3.80–5.20)
RDW: 16.9 % — AB (ref 11.5–14.5)
RDW: 16.7 % — ABNORMAL HIGH (ref 11.5–14.5)
WBC: 15.5 10*3/uL — ABNORMAL HIGH (ref 3.6–11.0)
WBC: 17.6 10*3/uL — ABNORMAL HIGH (ref 3.6–11.0)

## 2015-04-24 LAB — PREPARE PLATELET PHERESIS
UNIT DIVISION: 0
UNIT DIVISION: 0

## 2015-04-24 LAB — AMMONIA: AMMONIA: 73 umol/L — AB (ref 9–35)

## 2015-04-24 LAB — MRSA PCR SCREENING: MRSA by PCR: POSITIVE — AB

## 2015-04-24 MED ORDER — DEXTROSE 50 % IV SOLN
25.0000 mL | Freq: Once | INTRAVENOUS | Status: AC
  Administered 2015-04-24: 25 mL via INTRAVENOUS
  Filled 2015-04-24: qty 50

## 2015-04-24 MED ORDER — SODIUM CHLORIDE 0.9 % IV SOLN
Freq: Once | INTRAVENOUS | Status: AC
  Administered 2015-04-24: 11:00:00 via INTRAVENOUS

## 2015-04-24 MED ORDER — LEVOTHYROXINE SODIUM 100 MCG IV SOLR: 50.0000 ug | Freq: Every day | INTRAVENOUS | Status: DC

## 2015-04-24 MED ORDER — INSULIN ASPART 100 UNIT/ML ~~LOC~~ SOLN
25.0000 [IU] | Freq: Once | SUBCUTANEOUS | Status: AC
  Administered 2015-04-24: 25 [IU] via INTRAVENOUS
  Filled 2015-04-24: qty 25

## 2015-04-24 MED ORDER — CALCIUM GLUCONATE 10 % IV SOLN
INTRAVENOUS | Status: AC
  Filled 2015-04-24: qty 10

## 2015-04-24 MED ORDER — CHLORHEXIDINE GLUCONATE 0.12% ORAL RINSE (MEDLINE KIT)
15.0000 mL | Freq: Two times a day (BID) | OROMUCOSAL | Status: DC
  Administered 2015-04-24 (×2): 15 mL via OROMUCOSAL
  Filled 2015-04-24 (×4): qty 15

## 2015-04-24 MED ORDER — CHLORHEXIDINE GLUCONATE CLOTH 2 % EX PADS: 6.0000 | MEDICATED_PAD | Freq: Every day | CUTANEOUS | Status: DC

## 2015-04-24 MED ORDER — PROMETHAZINE HCL 25 MG/ML IJ SOLN
25.0000 mg | Freq: Four times a day (QID) | INTRAMUSCULAR | Status: DC | PRN
  Administered 2015-04-24: 25 mg via INTRAVENOUS
  Filled 2015-04-24 (×2): qty 1

## 2015-04-24 MED ORDER — SODIUM CHLORIDE 0.9 % IV BOLUS (SEPSIS)
1000.0000 mL | Freq: Once | INTRAVENOUS | Status: AC
  Administered 2015-04-24: 1000 mL via INTRAVENOUS

## 2015-04-24 MED ORDER — MUPIROCIN 2 % EX OINT
1.0000 "application " | TOPICAL_OINTMENT | Freq: Two times a day (BID) | CUTANEOUS | Status: DC
  Administered 2015-04-24: 1 via NASAL
  Filled 2015-04-24: qty 22

## 2015-04-24 MED ORDER — DEXTROSE 5 % IV SOLN
1.0000 g | INTRAVENOUS | Status: DC
  Filled 2015-04-24 (×3): qty 10

## 2015-04-24 MED ORDER — ANTISEPTIC ORAL RINSE SOLUTION (CORINZ)
7.0000 mL | Freq: Four times a day (QID) | OROMUCOSAL | Status: DC
  Administered 2015-04-24 (×3): 7 mL via OROMUCOSAL
  Filled 2015-04-24 (×5): qty 7

## 2015-04-24 MED ORDER — SODIUM CHLORIDE 0.9 % IV SOLN
1.0000 g | Freq: Once | INTRAVENOUS | Status: AC
  Administered 2015-04-24: 1 g via INTRAVENOUS
  Filled 2015-04-24: qty 10

## 2015-04-24 NOTE — Telephone Encounter (Signed)
Pt is in hospital in ice with esophageal verices.  They did an endo scopy and put in 10 bands, gave her 4 units of blood in er and she is sedated.  They may have to transfer her to another hospital to have a shunt put in her liver if this doesn't work.   cb for husband [phone] Thank you

## 2015-04-24 NOTE — Telephone Encounter (Signed)
Edd Arbour (spouse) called they took pt to hospital last night for bleeding internal vomiting blood  They gave her 4 units of blood Pt is ICU.     ICU dr told mr Mccranie that ms Hedges needs liver transplant.  He would like a call from Dr Glori Bickers to help understand wants going on Best number [phone]

## 2015-04-24 NOTE — Progress Notes (Signed)
Report called to [facility] at [facility] at 1730. [facility] notified. Pt is resting at the end of shift. Family at bedside.

## 2015-04-24 NOTE — Progress Notes (Signed)
RT to room to advance ETT 3 cm per verbal order from Dr. Juanell Fairly.  Patient ETT initally at 22 cm at lip, advanced to 25 at lip.  Patient tolerated well.  Will continue to monitor.

## 2015-04-24 NOTE — Anesthesia Postprocedure Evaluation (Signed)
Anesthesia Post Note  Patient: Jean Tucker  Procedure(s) Performed: Procedure(s) (LRB): UPPER ENDOSCOPY (N/A)  Patient location during evaluation: ICU Anesthesia Type: General Level of consciousness: sedated Pain management: pain level controlled Vital Signs Assessment: post-procedure vital signs reviewed and stable Respiratory status: patient remains intubated per anesthesia plan Cardiovascular status: stable Anesthetic complications: no Comments: Pt remains intubated post EGD. On fentanyl, octreotide and protonix drips. Stable vital signs    Last Vitals:  Filed Vitals:   04/24/15 0500 04/24/15 0600  BP: 117/61 111/59  Pulse: 68 68  Temp:    Resp: 16 10    Last Pain:  Filed Vitals:   04/24/15 0625  PainSc: Yolanda Manges

## 2015-04-24 NOTE — Consult Note (Addendum)
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 The patient is status post EGD with banding, gastroneurology is recommending transfer for TIPS placement.    PULMONARY  A:VDRF  P:   Intubated for airway protection. If no further intervention planned and if bleeding appears stable can consider extubation.  Addendum: - Patient developed another episode of hematemesis. Discussed with Dr. Candace Cruise, from gastroenterology, he recommended transfer to [facility] for TIPS placement. -Stat chest x-ray reviewed showed that the ET tube had moved superiorly, therefore, instructed respiratory to advance the ET tube by 3 cm. -Discussed with cone link, and intensivist at [facility], patient will be transferred later today once a bed is available.  CARDIOVASCULAR -Initial borderline low BP due to GI bleeding/hemorrhagic shock. Now stable.  -Continue IV fluids and transfusion as needed.   RENAL A:  Stable.   GASTROINTESTINAL A:   -Gi bleeding due to esophageal varices. S/p placement of banding at GE junction, no specific bleeding source identified.  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  -History of portal gastropathy and antral erosions.   P:   -Continue monitoring of Hb, transfuse as needed.  -On protonix drip and octreotide, to continue.  -Per Gi may need transfer to tertiary center if there is continued bleeding.   HEMATOLOGIC A:  Blood loss anemia.   P:  -Transfuse as needed.   INFECTIOUS A:  -  ENDOCRINE A: Hypothyroidism, stable.  Rheumatoid  arthritis, stable.  P:   --  NEUROLOGIC A:  --   MAJOR EVENTS/TEST RESULTS:  EGD 04/23/15; s/p banding x 10 of GE junction, there was continued bleeding with no specific source identified.      ---------------------------------------  ---------------------------------------   Name: Jean Tucker MRN: [id number] DOB: [birth year]    ADMISSION DATE:  04/23/2015 CONSULTATION DATE:  04/24/15.  REFERRING MD :  Dr. Candace Cruise  CHIEF COMPLAINT:  Hematemesis.    HISTORY OF PRESENT ILLNESS:   64 yo female with PMH of autoimmune cirrhosis and Esophageal varices. She is currently on the ventilator therefore history was obtained from chart and staff.  Pt. Presented with large volume hematemesis. In the Er she was having low SBP of around 80,   She was taken for EGD and intubated for the procedure. Prior bleeding event 11/2014. Not compliant with Nadolol and PPI. Hematemesis >> EGD with blood everywhere. GE junction banded (blindly). Continued bleeding post banding.    PAST MEDICAL HISTORY :  Past Medical History  Diagnosis Date  . Allergy   . GERD (gastroesophageal reflux disease)   . CAD (coronary artery disease)   . Hyperlipidemia   . Thyroid disease   . Osteoporosis   . Rheumatoid arthritis ([place])   . Esophageal varices ([place]) 09/22/2014    April, 2016-grade 2-3 varices  . Hypothyroidism   . Cirrhosis [facility])    Past Surgical History  Procedure Laterality Date  . Abdominal hysterectomy  1983    partial,bleeding  . Rotator cuff repair      X 4  . Tendon release    . Carpal tunnel release      X 2, right  . Esophagogastroduodenoscopy N/A 12/03/2014    Procedure: ESOPHAGOGASTRODUODENOSCOPY (EGD);  Surgeon: Manya Silvas, MD;  Location: [facility] ENDOSCOPY;  Service: Endoscopy;  Laterality: N/A;   Prior to Admission medications   Medication Sig Start Date  End Date Taking? Authorizing Provider  aspirin 325 MG tablet Take 325 mg by mouth daily.   Yes Historical Provider, MD  levothyroxine (SYNTHROID, LEVOTHROID) 100 MCG tablet Take 1 tablet (100 mcg total) by mouth daily. 12/09/14  Yes Abner Greenspan, MD  omeprazole (PRILOSEC) 20 MG capsule Take 1 capsule by mouth daily. 12/09/14    Historical Provider, MD  zolpidem (AMBIEN) 5 MG tablet Take 1 tablet (5 mg total) by mouth at bedtime as needed for sleep (caution of sedation). Patient not taking: Reported on 04/23/2015 12/17/14   Abner Greenspan, MD   Allergies  Allergen Reactions  . Codeine Anaphylaxis  . Arava [Leflunomide] Other (See Comments)    Pt states that this medication causes liver damage.   . Atorvastatin Other (See Comments)    Pt states that this medication causes liver damage.   . Humira [Adalimumab] Other (See Comments)    Pt states that this medication causes liver damage.   . Methotrexate Derivatives Other (See Comments)    Pt states that this medication causes liver damage.   . Simvastatin Other (See Comments)    Pt states that this medication causes liver damage and joint pain.     FAMILY HISTORY:  Family History  Problem Relation Age of Onset  . Diabetes Mother   . Hypertension Mother   . Alcohol abuse Father   . Ovarian cancer Mother   . Colon cancer Mother   . Colon polyps Mother   . Esophageal cancer Neg Hx   . Gallbladder disease Neg Hx   . Kidney disease Neg Hx    SOCIAL HISTORY:  reports that she quit smoking about 5 years ago. Her smoking use included Cigarettes. She has never used smokeless tobacco. She reports that she does not drink alcohol or use illicit drugs.  REVIEW OF SYSTEMS:   Pt currently intubated on the vent, can not provide ros.    VITAL SIGNS: Temp:  [97.7 F (36.5 C)-98.6 F (37 C)] 98.6 F (37 C) (12/02 0400) Pulse Rate:  [64-79] 68 (12/02 0600) Resp:  [9-24] 10 (12/02 0600) BP: (85-151)/(46-100) 111/59 mmHg (12/02 0600) SpO2:  [95 %-100 %] 100 % (12/02 0600) FiO2 (%):  [40 %] 40 % (12/02 0345) Weight:  [82.555 kg (182 lb)-91.1 kg (200 lb 13.4 oz)] 91.1 kg (200 lb 13.4 oz) (12/02 0000) HEMODYNAMICS:   VENTILATOR SETTINGS: Vent Mode:  [-] PRVC FiO2 (%):  [40 %] 40 % Set Rate:  [14 bmp] 14 bmp Vt Set:  [500 mL] 500 mL PEEP:  [5 cmH20] 5 cmH20 INTAKE /  OUTPUT:  Intake/Output Summary (Last 24 hours) at 04/24/15 0816 Last data filed at 04/24/15 0600  Gross per 24 hour  Intake 1066.84 ml  Output    350 ml  Net 716.84 ml    Physical Examination:   VS: BP 111/59 mmHg  Pulse 68  Temp(Src) 98.6 F (37 C) (Axillary)  Resp 10  Ht 5\' 5"  (1.651 m)  Wt 91.1 kg (200 lb 13.4 oz)  BMI 33.42 kg/m2  SpO2 100%  General Appearance: No distress, she is awake and alert and attempting to speak on the vent. She follows commands and nods to answer questions.  Neuro:without focal findings, mental status normal.  HEENT: PERRLA, EOM intact, no ptosis, no other lesions noticed;  Pulmonary: normal breath sounds., diaphragmatic excursion normal.No wheezing, No rales;     CardiovascularNormal S1,S2.  No m/r/g.    Abdomen: Benign, Soft, non-tender, No masses,  Renal:  No costovertebral tenderness  GU:  Not performed at this time. Endoc: No evident thyromegaly, no signs of acromegaly. Skin:   warm, no rashes, no ecchymosis  Extremities: normal, no cyanosis, clubbing, no edema, warm with normal capillary refill.   CXR image reviewed:  Mild hyperinflation, otherwise unremarkable.   LABS: Reviewed   LABORATORY PANEL:   CBC  Recent Labs Lab 04/24/15 0609  WBC 15.5*  HGB 8.5*  HCT 26.6*  PLT 207    Chemistries   Recent Labs Lab 04/23/15 1844 04/24/15 0609  NA 135 133*  K 3.4* 6.1*  CL 108 111  CO2 20* 21*  GLUCOSE 174* 160*  BUN 13 15  CREATININE 0.67 0.85  CALCIUM 8.1* 7.2*  AST 51*  --   ALT 15  --   ALKPHOS 62  --   BILITOT 0.7  --      Recent Labs Lab 04/23/15 2209  GLUCAP 132*    Recent Labs Lab 04/23/15 2324  PHART 7.34*  PCO2ART 36  PO2ART 89    Recent Labs Lab 04/23/15 1844  AST 51*  ALT 15  ALKPHOS 62  BILITOT 0.7  ALBUMIN 2.1*    Cardiac Enzymes No results for input(s): TROPONINI in the last 168 hours.  RADIOLOGY:  Dg Chest Port 1 View  04/23/2015  CLINICAL DATA:  Respiratory failure on  mechanically assisted ventilation. EXAM: PORTABLE CHEST 1 VIEW COMPARISON:  12/01/2014 FINDINGS: Endotracheal tube is 3 cm above the carina. Mild ground-glass opacities in the central lung regions could represent alveolar edema or infiltrate. Mild central vascular prominence. No large effusions. No pneumothorax. IMPRESSION: Satisfactory ET tube position. Mild vascular prominence and perihilar ground-glass opacities, possibly due to congestive failure or volume overload. Electronically Signed   By: Andreas Newport M.D.   On: 04/23/2015 23:03       --Marda Stalker, MD.  Board Certified in Internal Medicine, Pulmonary Medicine, [place], and Sleep Medicine.  [facility]  Patricia Pesa, M.D.  Vilinda Boehringer, M.D.  Merton Border, M.D   04/24/2015, 8:16 AM  Critical Care Attestation.  I have personally obtained a history, examined the patient, evaluated laboratory and imaging results, formulated the assessment and plan and placed orders. The Patient requires high complexity decision making for assessment and support, frequent evaluation and titration of therapies, application of advanced monitoring technologies and extensive interpretation of multiple databases. The patient has critical illness that could lead imminently to failure of 1 or more organ systems and requires the highest level of physician preparedness to intervene.  Critical Care Time devoted to patient care services described in this note is 35 minutes and is exclusive of time spent in procedures.  -Additional 35 minutes in critical care time spent in managing and coordinating patient's care.

## 2015-04-24 NOTE — Progress Notes (Addendum)
8:45 am pt began to experience nausea and vomit small amount of dark red emesis with clots. Dr. Candace Cruise notified and zofran given. 8:55 pt indicated having trouble bleeding.ET tube was suctioned and dark red clots were seen in ET tube. Dr. Candace Cruise notified. Instructed to call the hospitalist.  Pt began vomiting small amount of dark red clots again. Dr. Posey Pronto paged and informed of pt change in status. Orders put in for phenergan, will call with new orders after reviewing chart.  Dr. Juanell Fairly consulted for possible aspiration of blood. Ordered Stat chest x-ray and 1 liter bolus  NS   Dr. Candace Cruise ordered stat Hgb and Begin transfusion of 2 units PRBC.  MD's currently discussing options for transfer with family. Will continue to follow.

## 2015-04-24 NOTE — Progress Notes (Signed)
Platelets, PRBC, and FFP ready in blood bank.  Holding and continuing to monitor labs until needed per eLink MD Dr. Elsworth Soho.

## 2015-04-24 NOTE — Progress Notes (Addendum)
Pt discharged to [facility] with [facility] at this time. Husband updated and following to hospital.

## 2015-04-24 NOTE — Progress Notes (Signed)
Patient ID: Jean Tucker, female   DOB: Oct 09, 1950, 64 y.o.   MRN: [id number] Called by RN saying [facility] called and said now there are no beds available. Dr Shary Decamp had called in the morning for transfer of this pt to [facility] for TIPS I was told that the bed was available then but now no beds. In pt's best interest I will have to transfer her to [facility]. Dr Early Osmond ICU attending at [facility] was kind to make arrangements for th pt to be transferred.  Pt's husband has been notified of the change in transfer plans.  Critical time 35 mins

## 2015-04-24 NOTE — Consult Note (Signed)
  GI Inpatient Follow-up Note  Patient Identification: Jean Tucker is a 64 y.o. female with variceal bleeding.  Subjective: Relatively stable overnight intubated with no signs of active bleeding. However, few bouts of hematemesis. Stat CBC ordered. Ordered 2 units of PRBC. BP ok so far. Alert earlier but now lethargic. Possibility of aspiration according to nurse.  Scheduled Inpatient Medications:  . sodium chloride   Intravenous Once  . antiseptic oral rinse  7 mL Mouth Rinse QID  . calcium gluconate  1 g Intravenous Once  . chlorhexidine gluconate  15 mL Mouth Rinse BID  . Chlorhexidine Gluconate Cloth  6 each Topical Q0600  . dextrose  25 mL Intravenous Once  . insulin aspart  25 Units Intravenous Once  . mupirocin ointment  1 application Nasal BID  . sodium chloride  1,000 mL Intravenous Once    Continuous Inpatient Infusions:   . sodium chloride 20 mL/hr at 04/23/15 2350  . fentaNYL infusion INTRAVENOUS 75 mcg/hr (04/24/15 0405)  . octreotide  (SANDOSTATIN)    IV infusion 50 mcg/hr (04/24/15 0000)  . pantoprozole (PROTONIX) infusion 8 mg/hr (04/24/15 0000)    PRN Inpatient Medications:  fentaNYL, midazolam, midazolam, ondansetron **OR** ondansetron (ZOFRAN) IV, ondansetron (ZOFRAN) IV, promethazine  Review of Systems: Constitutional: Weight is stable.  Eyes: No changes in vision. ENT: No oral lesions, sore throat.  GI: see HPI.  Heme/Lymph: No easy bruising.  CV: No chest pain.  GU: No hematuria.  Integumentary: No rashes.  Neuro: No headaches.  Psych: No depression/anxiety.  Endocrine: No heat/cold intolerance.  Allergic/Immunologic: No urticaria.  Resp: No cough, SOB.  Musculoskeletal: No joint swelling.    Physical Examination: BP 111/59 mmHg  Pulse 68  Temp(Src) 98.4 F (36.9 C) (Oral)  Resp 10  Ht 5\' 5"  (1.651 m)  Wt 91.1 kg (200 lb 13.4 oz)  BMI 33.42 kg/m2  SpO2 100% Gen: Lethargic on vent. HEENT: PEERLA, EOMI, Neck: supple, no JVD or  thyromegaly Chest: CTA bilaterally, no wheezes, crackles, or other adventitious sounds CV: RRR, no m/g/c/r Abd: soft, NT, ND, +BS in all four quadrants; no HSM, guarding, ridigity, or rebound tenderness Ext: no edema, well perfused with 2+ pulses, Skin: no rash or lesions noted Lymph: no LAD  Data: Lab Results  Component Value Date   WBC 17.6* 04/24/2015   HGB 8.7* 04/24/2015   HCT 26.5* 04/24/2015   MCV 87.3 04/24/2015   PLT 224 04/24/2015    Recent Labs Lab 04/23/15 2300 04/24/15 0609 04/24/15 0929  HGB 8.2* 8.5* 8.7*   Lab Results  Component Value Date   NA 133* 04/24/2015   K 6.1* 04/24/2015   CL 111 04/24/2015   CO2 21* 04/24/2015   BUN 15 04/24/2015   CREATININE 0.85 04/24/2015   Lab Results  Component Value Date   ALT 15 04/23/2015   AST 51* 04/23/2015   ALKPHOS 62 04/23/2015   BILITOT 0.7 04/23/2015    Recent Labs Lab 04/23/15 1844  INR 1.56   Assessment/Plan: Ms. Reinhold is a 64 y.o. female with variceal bleeding. S/P esophageal banding.  Recommendations: Moniter closely for more bleeding. Continue supportive care. Recommend urgent transfer to a tertiary center for possible TIPS. thanks Please call with questions or concerns.  Blen Ransome, Lupita Dawn, MD

## 2015-04-24 NOTE — Progress Notes (Addendum)
Patient ID: LELU FERTITTA, female   DOB: [birth year], 64 y.o.   MRN: [id number] [facility] [place] NAME: Mariaceleste Schwark    MR#:  [id number]  DATE OF BIRTH:  1950-10-09  SUBJECTIVE:  Pt was brought in after she was vomiting dark blood since yesterday. Patient has received so far 4 units of packed cell and couple units of FFP. She underwent urgent EGD by Dr. ON banding was done for her is a patient with nurses. Patient is critically ill intubated for airway protection. She continues to vomit dark red blood. She opens eyes on verbal commands. She is currently on fentanyl drip.  REVIEW OF SYSTEMS:   Review of Systems  Unable to perform ROS: intubated   Tolerating Diet: Intubated on the vent Tolerating PT: Critically ill on the ventilator  DRUG ALLERGIES:   Allergies  Allergen Reactions  . Codeine Anaphylaxis  . Arava [Leflunomide] Other (See Comments)    Pt states that this medication causes liver damage.   . Atorvastatin Other (See Comments)    Pt states that this medication causes liver damage.   . Humira [Adalimumab] Other (See Comments)    Pt states that this medication causes liver damage.   . Methotrexate Derivatives Other (See Comments)    Pt states that this medication causes liver damage.   . Simvastatin Other (See Comments)    Pt states that this medication causes liver damage and joint pain.     VITALS:  Blood pressure 111/59, pulse 68, temperature 98.4 F (36.9 C), temperature source Oral, resp. rate 10, height 5\' 5"  (1.651 m), weight 91.1 kg (200 lb 13.4 oz), SpO2 100 %.  PHYSICAL EXAMINATION:   Physical Exam  GENERAL:  64 y.o.-year-old patient lying in the bed with mild to moderate acute distress. Critically ill EYES: Pupils equal, round, reactive to light and accommodation. No scleral icterus. Extraocular muscles intact. Pallor HEENT: Head atraumatic, normocephalic. Oropharynx and nasopharynx clear. Intubated  on the ventilator. Vomited blood seen in the oral cavity.  NECK:  Supple, no jugular venous distention. No thyroid enlargement, no tenderness.  LUNGS: Normal breath sounds bilaterally, no wheezing, rales, rhonchi. No use of accessory muscles of respiration.  CARDIOVASCULAR: S1, S2 normal. No murmurs, rubs, or gallops.  ABDOMEN: Soft, nontender, distended. Bowel sounds present. No organomegaly or mass.  EXTREMITIES: No cyanosis, clubbing or edema b/l.    NEUROLOGIC: Cranial nerves II through XII are intact. No focal Motor or sensory deficits b/l.   PSYCHIATRIC: The patient is alert and oriented x 3.  SKIN: No obvious rash, lesion, or ulcer.    LABORATORY PANEL:   CBC  Recent Labs Lab 04/24/15 0929  WBC 17.6*  HGB 8.7*  HCT 26.5*  PLT 224    Chemistries   Recent Labs Lab 04/23/15 1844 04/24/15 0609  NA 135 133*  K 3.4* 6.1*  CL 108 111  CO2 20* 21*  GLUCOSE 174* 160*  BUN 13 15  CREATININE 0.67 0.85  CALCIUM 8.1* 7.2*  AST 51*  --   ALT 15  --   ALKPHOS 62  --   BILITOT 0.7  --     Cardiac Enzymes No results for input(s): TROPONINI in the last 168 hours.  RADIOLOGY:  Dg Chest Port 1 View  04/23/2015  CLINICAL DATA:  Respiratory failure on mechanically assisted ventilation. EXAM: PORTABLE CHEST 1 VIEW COMPARISON:  12/01/2014 FINDINGS: Endotracheal tube is 3 cm above the carina. Mild ground-glass opacities  in the central lung regions could represent alveolar edema or infiltrate. Mild central vascular prominence. No large effusions. No pneumothorax. IMPRESSION: Satisfactory ET tube position. Mild vascular prominence and perihilar ground-glass opacities, possibly due to congestive failure or volume overload. Electronically Signed   By: Andreas Newport M.D.   On: 04/23/2015 23:03     ASSESSMENT AND PLAN:  Adri Melchor is a 64 y.o. female with a known history of autoimmune cirrhosis with history of esophageal varices comes in with profuse hematemesis started 2 hours  ago. Patient had supper after that noted to have profuse vomiting of blood, dizziness. Noted to have black stool for to 3 days  #1. Acute upper GI bleed likely due to esophageal variceal hemorrhage ;due to severe portal hypertensive gastropathy in the setting of autoimmune hepatitis/cirrhosis  -Patient was intubated for airway protection. -On IV octreotide drip. -Continue IV Protonix drip. -Patient is status post 4 unit of blood transfusion. She has had several units of FFP. -Patient will be getting 2 more units of blood transfusion this morning. -She is status post emergent endoscopy done by Dr. Candace Cruise with a significant amount of bleeding and status post banding 10 times around the gastroesophageal junction. -Dr. Candace Cruise recommends  patient be transferred to tertiary care center given her ongoing active bleeding. Possible TIPS procedure -Patient has a better [facility] and will be transferred. This was discussed with the patient's daughter was present in the ICU. -Family understands patient is critically ill -Her most recent hemoglobin is 8.7. -empirically on IV rocephin (gi bleed, cirrhosis, esophageal varices)  #2 acute severe anemia secondary to posthemorrhagic anemia requiring multiple units of transfusion secondary to severe GI bleed: - transfuse to keep hemoglobin more than 9.  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 -Stat IV 1 amp of calcium gluconate -Recheck potassium after above.  Given the severity of her ongoing GI bleed patient is to be transferred to [facility]: MICU. Dr. Ashby Dawes has spoken with ICU M.D. at [facility]. Patient will be transferred [facility]. Family aware of patient being transferred.  Case discussed with Care Management/Social  Worker. Management plans discussed with the patient, family and they are in agreement.  CODE STATUS: Full  DVT Prophylaxis: SCD teds. No antiplatelet patient has severe GI bleed  TOTAL CRITICAL TIME TAKING CARE OF THIS PATIENT: 45 minutes.  >50% time spent on counselling and coordination of care  Penny Frisbie M.D on 04/24/2015 at 10:01 AM  Between 7am to 6pm - Pager - [phone]  After 6pm go to [email or web address] - password [email or web address] [facility]  [facility]  Office  [phone]  CC: Primary care physician; [place], [id number]

## 2015-04-24 NOTE — Telephone Encounter (Signed)
I spoke to him - they banded some varices and will be tranfering her to [facility] for a likely TIPS procedure   She was supposed to have an appt with her liver specialist yesterday-but became sick  Disc the first goal which is stabilization - will go from there Unsure if she will eventually need a liver specialist  Will continue to follow

## 2015-04-24 NOTE — Progress Notes (Addendum)
[facility] here to pick up patient to be transferred to [facility].

## 2015-04-25 LAB — TYPE AND SCREEN
ABO/RH(D): A POS
ANTIBODY SCREEN: NEGATIVE
UNIT DIVISION: 0
UNIT DIVISION: 0
UNIT DIVISION: 0
Unit division: 0
Unit division: 0
Unit division: 0
Unit division: 0
Unit division: 0

## 2015-04-26 LAB — PREPARE FRESH FROZEN PLASMA
UNIT DIVISION: 0
UNIT DIVISION: 0
Unit division: 0
Unit division: 0

## 2015-04-29 ENCOUNTER — Encounter: Payer: Self-pay | Admitting: Gastroenterology

## 2015-04-29 LAB — PREPARE RBC (CROSSMATCH)

## 2015-05-05 NOTE — Discharge Summary (Signed)
[facility] NAME: Briona Killoren    MR#:  [id number]  DATE OF BIRTH:  [birth year]  DATE OF ADMISSION:  04/23/2015 ADMITTING PHYSICIAN: Hulen Luster, MD  DATE OF DISCHARGE:05/05/15  PRIMARY CARE PHYSICIAN: Loura Pardon, MD    ADMISSION DIAGNOSIS:  Gastrointestinal hemorrhage with melena [K92.1]  DISCHARGE DIAGNOSIS:  Severe GI bleed due to esophageal varices in the setting of severe cirrhosis of liver Severe portal hypertensive gastropathy Post-hemorrhagic anemia s/p multiple unit BT   SECONDARY DIAGNOSIS:   Past Medical History  Diagnosis Date  . Allergy   . GERD (gastroesophageal reflux disease)   . CAD (coronary artery disease)   . Hyperlipidemia   . Thyroid disease   . Osteoporosis   . Rheumatoid arthritis ([place])   . Esophageal varices ([place]) 09/22/2014    April, 2016-grade 2-3 varices  . Hypothyroidism   . Cirrhosis [facility])     HOSPITAL COURSE:   Madilynne Winrow is a 64 y.o. 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 Patient had supper after that noted to have profuse vomiting of blood, dizziness. Noted to have black stool for to 3 days  #1. Acute upper GI bleed likely due to esophageal variceal hemorrhage ;due to severe portal hypertensive gastropathy in the setting of autoimmune hepatitis/cirrhosis  -Patient was intubated for airway protection. -On IV octreotide drip and IV Protonix drip. -Patient is status post 4 unit of blood transfusion. She has had several units of FFP. -Patient will be getting 2 more units of blood transfusion this morning. -She is status post emergent endoscopy done by Dr. Candace Cruise with a significant amount of bleeding and status post banding 10 times around the gastroesophageal junction. -Dr. Candace Cruise recommends patient be transferred to tertiary care center given her ongoing active bleeding. Possible TIPS  procedure -Patient has a bed at [facility] and will be transferred. This was discussed with the patient's daughter was present in the ICU. -Family understands patient is critically ill -Her most recent hemoglobin is 8.7. -empirically on IV rocephin (gi bleed, cirrhosis, esophageal varices)  #2 acute severe anemia secondary to posthemorrhagic anemia requiring multiple units of transfusion secondary to severe GI bleed: - transfuse to keep hemoglobin more than 9.  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 -Stat IV 1 amp of calcium gluconate -Recheck potassium after above.  Given the severity of her ongoing GI bleed patient is to be transferred to tertiary care center Family aware of patient being transferred.  Case discussed with Care Management/Social Worker. Management plans discussed with the patient, family and they are in agreement.  CODE STATUS: Full  DVT Prophylaxis: SCD teds. No antiplatelet patient has severe GI bleed CONSULTS OBTAINED:  Treatment Team:  Laverle Hobby, MD Hulen Luster, MD  DRUG ALLERGIES:   Allergies  Allergen Reactions  . Codeine Anaphylaxis  . Arava [Leflunomide] Other (See Comments)    Pt states that this medication causes liver damage.   . Atorvastatin Other (See Comments)    Pt states that this medication causes liver damage.   . Humira [Adalimumab] Other (See Comments)    Pt states that this medication causes liver damage.   Marland Kitchen  Methotrexate Derivatives Other (See Comments)    Pt states that this medication causes liver damage.   . Simvastatin Other (See Comments)    Pt states that this medication causes liver damage and joint pain.     DISCHARGE MEDICATIONS:   Discharge Medication List as of 04/24/2015   8:57 PM    CONTINUE these medications which have NOT CHANGED   Details  aspirin 325 MG tablet Take 325 mg by mouth daily., Until Discontinued, Historical Med    levothyroxine (SYNTHROID, LEVOTHROID) 100 MCG tablet Take 1 tablet (100 mcg total) by mouth daily., Starting 12/09/2014, Until Discontinued, Normal    omeprazole (PRILOSEC) 20 MG capsule Take 1 capsule by mouth daily., Starting 12/09/2014, Until Discontinued, Historical Med    zolpidem (AMBIEN) 5 MG tablet Take 1 tablet (5 mg total) by mouth at bedtime as needed for sleep (caution of sedation)., Starting 12/17/2014, Until Discontinued, Print        If you experience worsening of your admission symptoms, develop shortness of breath, life threatening emergency, suicidal or homicidal thoughts you must seek medical attention immediately by calling 911 or calling your MD immediately  if symptoms less severe.  You Must read complete instructions/literature along with all the possible adverse reactions/side effects for all the Medicines you take and that have been prescribed to you. Take any new Medicines after you have completely understood and accept all the possible adverse reactions/side effects.   Please note  You were cared for by a hospitalist during your hospital stay. If you have any questions about your discharge medications or the care you received while you were in the hospital after you are discharged, you can call the unit and asked to speak with the hospitalist on call if the hospitalist that took care of you is not available. Once you are discharged, your primary care physician will handle any further medical issues. Please note that NO REFILLS for any discharge medications will be authorized once you are discharged, as it is imperative that you return to your primary care physician (or establish a relationship with a primary care physician if you do not have one) for your aftercare needs so that they can reassess your need for  medications and monitor your lab values. TOTAL TIME TAKING CARE OF THIS PATIENT: 38minutes.    Emina Ribaudo M.D   Between 7am to 6pm - Pager - [phone] After 6pm go to [email or web address] - password [email or web address] [facility]  [facility]  Office  [phone]  CC: Primary care physician; Loura Pardon, MD

## 2015-05-08 ENCOUNTER — Inpatient Hospital Stay
Admission: EM | Admit: 2015-05-08 | Discharge: 2015-05-24 | DRG: 871 | Disposition: E | Payer: BLUE CROSS/BLUE SHIELD | Attending: Internal Medicine | Admitting: Internal Medicine

## 2015-05-08 ENCOUNTER — Ambulatory Visit: Payer: BLUE CROSS/BLUE SHIELD | Admitting: Family Medicine

## 2015-05-08 ENCOUNTER — Telehealth: Payer: Self-pay | Admitting: Family Medicine

## 2015-05-08 ENCOUNTER — Encounter: Payer: Self-pay | Admitting: Emergency Medicine

## 2015-05-08 ENCOUNTER — Inpatient Hospital Stay: Payer: BLUE CROSS/BLUE SHIELD

## 2015-05-08 ENCOUNTER — Emergency Department: Payer: BLUE CROSS/BLUE SHIELD

## 2015-05-08 DIAGNOSIS — I8510 Secondary esophageal varices without bleeding: Secondary | ICD-10-CM | POA: Diagnosis present

## 2015-05-08 DIAGNOSIS — Z87891 Personal history of nicotine dependence: Secondary | ICD-10-CM

## 2015-05-08 DIAGNOSIS — R0600 Dyspnea, unspecified: Secondary | ICD-10-CM | POA: Diagnosis not present

## 2015-05-08 DIAGNOSIS — J9622 Acute and chronic respiratory failure with hypercapnia: Secondary | ICD-10-CM | POA: Diagnosis not present

## 2015-05-08 DIAGNOSIS — Z8041 Family history of malignant neoplasm of ovary: Secondary | ICD-10-CM | POA: Diagnosis not present

## 2015-05-08 DIAGNOSIS — J9621 Acute and chronic respiratory failure with hypoxia: Secondary | ICD-10-CM | POA: Diagnosis not present

## 2015-05-08 DIAGNOSIS — I120 Hypertensive chronic kidney disease with stage 5 chronic kidney disease or end stage renal disease: Secondary | ICD-10-CM | POA: Diagnosis present

## 2015-05-08 DIAGNOSIS — J449 Chronic obstructive pulmonary disease, unspecified: Secondary | ICD-10-CM | POA: Diagnosis not present

## 2015-05-08 DIAGNOSIS — Y95 Nosocomial condition: Secondary | ICD-10-CM | POA: Diagnosis present

## 2015-05-08 DIAGNOSIS — R0603 Acute respiratory distress: Secondary | ICD-10-CM

## 2015-05-08 DIAGNOSIS — R6521 Severe sepsis with septic shock: Secondary | ICD-10-CM | POA: Diagnosis not present

## 2015-05-08 DIAGNOSIS — Z8249 Family history of ischemic heart disease and other diseases of the circulatory system: Secondary | ICD-10-CM | POA: Diagnosis not present

## 2015-05-08 DIAGNOSIS — K7290 Hepatic failure, unspecified without coma: Secondary | ICD-10-CM | POA: Diagnosis present

## 2015-05-08 DIAGNOSIS — J189 Pneumonia, unspecified organism: Secondary | ICD-10-CM | POA: Diagnosis present

## 2015-05-08 DIAGNOSIS — R579 Shock, unspecified: Secondary | ICD-10-CM | POA: Diagnosis not present

## 2015-05-08 DIAGNOSIS — N179 Acute kidney failure, unspecified: Secondary | ICD-10-CM | POA: Diagnosis not present

## 2015-05-08 DIAGNOSIS — R410 Disorientation, unspecified: Secondary | ICD-10-CM | POA: Diagnosis not present

## 2015-05-08 DIAGNOSIS — I480 Paroxysmal atrial fibrillation: Secondary | ICD-10-CM | POA: Diagnosis not present

## 2015-05-08 DIAGNOSIS — Z515 Encounter for palliative care: Secondary | ICD-10-CM | POA: Diagnosis not present

## 2015-05-08 DIAGNOSIS — I2510 Atherosclerotic heart disease of native coronary artery without angina pectoris: Secondary | ICD-10-CM | POA: Diagnosis present

## 2015-05-08 DIAGNOSIS — Z4659 Encounter for fitting and adjustment of other gastrointestinal appliance and device: Secondary | ICD-10-CM

## 2015-05-08 DIAGNOSIS — K754 Autoimmune hepatitis: Secondary | ICD-10-CM | POA: Diagnosis present

## 2015-05-08 DIAGNOSIS — G9340 Encephalopathy, unspecified: Secondary | ICD-10-CM | POA: Diagnosis not present

## 2015-05-08 DIAGNOSIS — I959 Hypotension, unspecified: Secondary | ICD-10-CM | POA: Diagnosis not present

## 2015-05-08 DIAGNOSIS — Y92230 Patient room in hospital as the place of occurrence of the external cause: Secondary | ICD-10-CM | POA: Diagnosis not present

## 2015-05-08 DIAGNOSIS — E876 Hypokalemia: Secondary | ICD-10-CM | POA: Diagnosis not present

## 2015-05-08 DIAGNOSIS — D649 Anemia, unspecified: Secondary | ICD-10-CM | POA: Diagnosis present

## 2015-05-08 DIAGNOSIS — Z800 Family history of malignant neoplasm of digestive organs: Secondary | ICD-10-CM

## 2015-05-08 DIAGNOSIS — K219 Gastro-esophageal reflux disease without esophagitis: Secondary | ICD-10-CM | POA: Diagnosis present

## 2015-05-08 DIAGNOSIS — Z833 Family history of diabetes mellitus: Secondary | ICD-10-CM

## 2015-05-08 DIAGNOSIS — R0602 Shortness of breath: Secondary | ICD-10-CM

## 2015-05-08 DIAGNOSIS — J810 Acute pulmonary edema: Secondary | ICD-10-CM | POA: Diagnosis not present

## 2015-05-08 DIAGNOSIS — I119 Hypertensive heart disease without heart failure: Secondary | ICD-10-CM | POA: Diagnosis present

## 2015-05-08 DIAGNOSIS — T426X5A Adverse effect of other antiepileptic and sedative-hypnotic drugs, initial encounter: Secondary | ICD-10-CM | POA: Diagnosis not present

## 2015-05-08 DIAGNOSIS — Z79899 Other long term (current) drug therapy: Secondary | ICD-10-CM

## 2015-05-08 DIAGNOSIS — E039 Hypothyroidism, unspecified: Secondary | ICD-10-CM | POA: Diagnosis present

## 2015-05-08 DIAGNOSIS — M069 Rheumatoid arthritis, unspecified: Secondary | ICD-10-CM | POA: Diagnosis present

## 2015-05-08 DIAGNOSIS — E785 Hyperlipidemia, unspecified: Secondary | ICD-10-CM | POA: Diagnosis present

## 2015-05-08 DIAGNOSIS — R404 Transient alteration of awareness: Secondary | ICD-10-CM

## 2015-05-08 DIAGNOSIS — M810 Age-related osteoporosis without current pathological fracture: Secondary | ICD-10-CM | POA: Diagnosis present

## 2015-05-08 DIAGNOSIS — N185 Chronic kidney disease, stage 5: Secondary | ICD-10-CM | POA: Diagnosis present

## 2015-05-08 DIAGNOSIS — E8770 Fluid overload, unspecified: Secondary | ICD-10-CM | POA: Diagnosis not present

## 2015-05-08 DIAGNOSIS — K7469 Other cirrhosis of liver: Secondary | ICD-10-CM | POA: Diagnosis present

## 2015-05-08 DIAGNOSIS — D72829 Elevated white blood cell count, unspecified: Secondary | ICD-10-CM

## 2015-05-08 DIAGNOSIS — Z9911 Dependence on respirator [ventilator] status: Secondary | ICD-10-CM | POA: Diagnosis not present

## 2015-05-08 DIAGNOSIS — I441 Atrioventricular block, second degree: Secondary | ICD-10-CM | POA: Diagnosis present

## 2015-05-08 DIAGNOSIS — J9600 Acute respiratory failure, unspecified whether with hypoxia or hypercapnia: Secondary | ICD-10-CM | POA: Diagnosis not present

## 2015-05-08 DIAGNOSIS — N170 Acute kidney failure with tubular necrosis: Secondary | ICD-10-CM | POA: Diagnosis present

## 2015-05-08 DIAGNOSIS — G9341 Metabolic encephalopathy: Secondary | ICD-10-CM | POA: Diagnosis not present

## 2015-05-08 DIAGNOSIS — A419 Sepsis, unspecified organism: Secondary | ICD-10-CM | POA: Diagnosis present

## 2015-05-08 DIAGNOSIS — J9601 Acute respiratory failure with hypoxia: Secondary | ICD-10-CM | POA: Diagnosis not present

## 2015-05-08 DIAGNOSIS — Z66 Do not resuscitate: Secondary | ICD-10-CM | POA: Diagnosis not present

## 2015-05-08 DIAGNOSIS — R251 Tremor, unspecified: Secondary | ICD-10-CM

## 2015-05-08 DIAGNOSIS — Z885 Allergy status to narcotic agent status: Secondary | ICD-10-CM | POA: Diagnosis not present

## 2015-05-08 DIAGNOSIS — N2581 Secondary hyperparathyroidism of renal origin: Secondary | ICD-10-CM | POA: Diagnosis present

## 2015-05-08 DIAGNOSIS — R0902 Hypoxemia: Secondary | ICD-10-CM

## 2015-05-08 DIAGNOSIS — R918 Other nonspecific abnormal finding of lung field: Secondary | ICD-10-CM | POA: Diagnosis not present

## 2015-05-08 DIAGNOSIS — J9690 Respiratory failure, unspecified, unspecified whether with hypoxia or hypercapnia: Secondary | ICD-10-CM

## 2015-05-08 DIAGNOSIS — K7030 Alcoholic cirrhosis of liver without ascites: Secondary | ICD-10-CM | POA: Diagnosis not present

## 2015-05-08 LAB — CBC
HCT: 27.7 % — ABNORMAL LOW (ref 35.0–47.0)
Hemoglobin: 8.7 g/dL — ABNORMAL LOW (ref 12.0–16.0)
MCH: 28.9 pg (ref 26.0–34.0)
MCHC: 31.4 g/dL — ABNORMAL LOW (ref 32.0–36.0)
MCV: 92 fL (ref 80.0–100.0)
PLATELETS: 318 10*3/uL (ref 150–440)
RBC: 3.01 MIL/uL — AB (ref 3.80–5.20)
RDW: 19.8 % — AB (ref 11.5–14.5)
WBC: 15.3 10*3/uL — AB (ref 3.6–11.0)

## 2015-05-08 LAB — BLOOD GAS, ARTERIAL
ALLENS TEST (PASS/FAIL): POSITIVE — AB
Acid-base deficit: 3.8 mmol/L — ABNORMAL HIGH (ref 0.0–2.0)
Bicarbonate: 21.5 mEq/L (ref 21.0–28.0)
FIO2: 0.32
O2 Saturation: 92.3 %
PH ART: 7.35 (ref 7.350–7.450)
Patient temperature: 37
pCO2 arterial: 39 mmHg (ref 32.0–48.0)
pO2, Arterial: 68 mmHg — ABNORMAL LOW (ref 83.0–108.0)

## 2015-05-08 LAB — URINALYSIS COMPLETE WITH MICROSCOPIC (ARMC ONLY)
Bilirubin Urine: NEGATIVE
Glucose, UA: NEGATIVE mg/dL
Ketones, ur: NEGATIVE mg/dL
Nitrite: NEGATIVE
Protein, ur: NEGATIVE mg/dL
Specific Gravity, Urine: 1.01 (ref 1.005–1.030)
pH: 5 (ref 5.0–8.0)

## 2015-05-08 LAB — SODIUM, URINE, RANDOM: Sodium, Ur: 41 mmol/L

## 2015-05-08 LAB — PROTEIN / CREATININE RATIO, URINE
CREATININE, URINE: 49 mg/dL
PROTEIN CREATININE RATIO: 0.22 mg/mg{creat} — AB (ref 0.00–0.15)
TOTAL PROTEIN, URINE: 11 mg/dL

## 2015-05-08 LAB — PROTIME-INR
INR: 1.26
Prothrombin Time: 15.9 seconds — ABNORMAL HIGH (ref 11.4–15.0)

## 2015-05-08 LAB — CREATININE, URINE, RANDOM: Creatinine, Urine: 52 mg/dL

## 2015-05-08 LAB — TROPONIN I: Troponin I: 0.06 ng/mL — ABNORMAL HIGH

## 2015-05-08 LAB — BASIC METABOLIC PANEL
Anion gap: 11 (ref 5–15)
BUN: 54 mg/dL — AB (ref 6–20)
CALCIUM: 8.7 mg/dL — AB (ref 8.9–10.3)
CO2: 21 mmol/L — AB (ref 22–32)
CREATININE: 6.95 mg/dL — AB (ref 0.44–1.00)
Chloride: 103 mmol/L (ref 101–111)
GFR calc non Af Amer: 6 mL/min — ABNORMAL LOW (ref >60)
GFR, EST AFRICAN AMERICAN: 7 mL/min — AB (ref >60)
Glucose, Bld: 185 mg/dL — ABNORMAL HIGH (ref 65–99)
Potassium: 3.6 mmol/L (ref 3.5–5.1)
SODIUM: 135 mmol/L (ref 135–145)

## 2015-05-08 LAB — GLUCOSE, CAPILLARY: Glucose-Capillary: 152 mg/dL — ABNORMAL HIGH (ref 65–99)

## 2015-05-08 LAB — LACTIC ACID, PLASMA
LACTIC ACID, VENOUS: 2.8 mmol/L — AB (ref 0.5–2.0)
Lactic Acid, Venous: 2.5 mmol/L (ref 0.5–2.0)

## 2015-05-08 LAB — AMMONIA: Ammonia: 26 umol/L (ref 9–35)

## 2015-05-08 LAB — BRAIN NATRIURETIC PEPTIDE: B Natriuretic Peptide: 1986 pg/mL — ABNORMAL HIGH (ref 0.0–100.0)

## 2015-05-08 LAB — TSH: TSH: 3.574 u[IU]/mL (ref 0.350–4.500)

## 2015-05-08 MED ORDER — LACTULOSE 10 GM/15ML PO SOLN
20.0000 g | Freq: Every day | ORAL | Status: DC
  Administered 2015-05-08 – 2015-05-15 (×7): 20 g via ORAL
  Filled 2015-05-08 (×8): qty 30

## 2015-05-08 MED ORDER — LEVOFLOXACIN IN D5W 500 MG/100ML IV SOLN
500.0000 mg | INTRAVENOUS | Status: DC
  Administered 2015-05-10: 500 mg via INTRAVENOUS
  Filled 2015-05-08: qty 100

## 2015-05-08 MED ORDER — LEVOTHYROXINE SODIUM 75 MCG PO TABS
100.0000 ug | ORAL_TABLET | Freq: Every day | ORAL | Status: DC
  Administered 2015-05-08 – 2015-05-12 (×5): 100 ug via ORAL
  Filled 2015-05-08 (×5): qty 1

## 2015-05-08 MED ORDER — IPRATROPIUM-ALBUTEROL 0.5-2.5 (3) MG/3ML IN SOLN
3.0000 mL | Freq: Once | RESPIRATORY_TRACT | Status: AC
  Administered 2015-05-08: 3 mL via RESPIRATORY_TRACT
  Filled 2015-05-08: qty 3

## 2015-05-08 MED ORDER — ALBUTEROL SULFATE HFA 108 (90 BASE) MCG/ACT IN AERS: 2.0000 | INHALATION_SPRAY | RESPIRATORY_TRACT | Status: DC | PRN

## 2015-05-08 MED ORDER — ACETAMINOPHEN 325 MG PO TABS
650.0000 mg | ORAL_TABLET | Freq: Four times a day (QID) | ORAL | Status: DC | PRN
  Administered 2015-05-08 – 2015-05-15 (×5): 650 mg via ORAL
  Filled 2015-05-08 (×5): qty 2

## 2015-05-08 MED ORDER — ONDANSETRON HCL 4 MG/2ML IJ SOLN
4.0000 mg | Freq: Four times a day (QID) | INTRAMUSCULAR | Status: DC | PRN
  Administered 2015-05-14: 19:00:00 4 mg via INTRAVENOUS
  Filled 2015-05-08: qty 2

## 2015-05-08 MED ORDER — PIPERACILLIN-TAZOBACTAM 3.375 G IVPB
3.3750 g | Freq: Once | INTRAVENOUS | Status: AC
  Administered 2015-05-08: 3.375 g via INTRAVENOUS
  Filled 2015-05-08: qty 50

## 2015-05-08 MED ORDER — HEPARIN SODIUM (PORCINE) 5000 UNIT/ML IJ SOLN
5000.0000 [IU] | Freq: Two times a day (BID) | INTRAMUSCULAR | Status: DC
  Administered 2015-05-08 – 2015-05-19 (×23): 5000 [IU] via SUBCUTANEOUS
  Filled 2015-05-08 (×23): qty 1

## 2015-05-08 MED ORDER — ZOLPIDEM TARTRATE 5 MG PO TABS
5.0000 mg | ORAL_TABLET | Freq: Every evening | ORAL | Status: DC | PRN
  Administered 2015-05-08 – 2015-05-10 (×3): 5 mg via ORAL
  Filled 2015-05-08 (×3): qty 1

## 2015-05-08 MED ORDER — FUROSEMIDE 10 MG/ML IJ SOLN: 40.0000 mg | Freq: Once | INTRAMUSCULAR | Status: DC

## 2015-05-08 MED ORDER — RIFAXIMIN 550 MG PO TABS
550.0000 mg | ORAL_TABLET | Freq: Two times a day (BID) | ORAL | Status: DC
  Administered 2015-05-08 – 2015-05-15 (×16): 550 mg via ORAL
  Filled 2015-05-08 (×17): qty 1

## 2015-05-08 MED ORDER — METOPROLOL TARTRATE 25 MG PO TABS
12.5000 mg | ORAL_TABLET | Freq: Two times a day (BID) | ORAL | Status: DC
Start: 2015-05-08 — End: 2015-05-16
  Administered 2015-05-08 – 2015-05-12 (×10): 12.5 mg via ORAL
  Administered 2015-05-13: 10:00:00 via ORAL
  Administered 2015-05-13 – 2015-05-15 (×5): 12.5 mg via ORAL
  Filled 2015-05-08 (×16): qty 1

## 2015-05-08 MED ORDER — MIDAZOLAM HCL 2 MG/2ML IJ SOLN
0.2500 mg | Freq: Once | INTRAMUSCULAR | Status: AC
  Administered 2015-05-08: 0.5 mg via INTRAVENOUS

## 2015-05-08 MED ORDER — SODIUM CHLORIDE 0.9 % IJ SOLN
3.0000 mL | Freq: Two times a day (BID) | INTRAMUSCULAR | Status: DC
  Administered 2015-05-08 – 2015-05-21 (×23): 3 mL via INTRAVENOUS

## 2015-05-08 MED ORDER — VANCOMYCIN HCL IN DEXTROSE 750-5 MG/150ML-% IV SOLN
750.0000 mg | INTRAVENOUS | Status: DC | PRN
  Administered 2015-05-11: 750 mg via INTRAVENOUS
  Filled 2015-05-08 (×3): qty 150

## 2015-05-08 MED ORDER — ONDANSETRON HCL 4 MG PO TABS: 4.0000 mg | ORAL_TABLET | Freq: Four times a day (QID) | ORAL | Status: DC | PRN

## 2015-05-08 MED ORDER — FENTANYL CITRATE (PF) 100 MCG/2ML IJ SOLN: 100.0000 ug | Freq: Once | INTRAMUSCULAR | Status: DC

## 2015-05-08 MED ORDER — IPRATROPIUM-ALBUTEROL 0.5-2.5 (3) MG/3ML IN SOLN
3.0000 mL | Freq: Once | RESPIRATORY_TRACT | Status: AC
  Administered 2015-05-08: 3 mL via RESPIRATORY_TRACT

## 2015-05-08 MED ORDER — FENTANYL CITRATE (PF) 100 MCG/2ML IJ SOLN
25.0000 ug | Freq: Once | INTRAMUSCULAR | Status: AC
  Administered 2015-05-08: 25 ug via INTRAVENOUS

## 2015-05-08 MED ORDER — IPRATROPIUM-ALBUTEROL 0.5-2.5 (3) MG/3ML IN SOLN
RESPIRATORY_TRACT | Status: AC
  Administered 2015-05-08: 3 mL via RESPIRATORY_TRACT
  Filled 2015-05-08: qty 3

## 2015-05-08 MED ORDER — FUROSEMIDE 10 MG/ML IJ SOLN
INTRAMUSCULAR | Status: AC
  Administered 2015-05-08: 40 mg via INTRAVENOUS
  Filled 2015-05-08: qty 4

## 2015-05-08 MED ORDER — PANTOPRAZOLE SODIUM 40 MG PO TBEC
40.0000 mg | DELAYED_RELEASE_TABLET | Freq: Every day | ORAL | Status: DC
  Administered 2015-05-08 – 2015-05-15 (×8): 40 mg via ORAL
  Filled 2015-05-08 (×8): qty 1

## 2015-05-08 MED ORDER — VANCOMYCIN HCL IN DEXTROSE 1-5 GM/200ML-% IV SOLN
1000.0000 mg | Freq: Once | INTRAVENOUS | Status: AC
  Administered 2015-05-08: 1000 mg via INTRAVENOUS
  Filled 2015-05-08: qty 200

## 2015-05-08 MED ORDER — MIDAZOLAM HCL 2 MG/2ML IJ SOLN
INTRAMUSCULAR | Status: AC
  Administered 2015-05-08: 0.5 mg via INTRAVENOUS
  Filled 2015-05-08: qty 2

## 2015-05-08 MED ORDER — IPRATROPIUM-ALBUTEROL 0.5-2.5 (3) MG/3ML IN SOLN
3.0000 mL | RESPIRATORY_TRACT | Status: DC | PRN
  Administered 2015-05-11: 3 mL via RESPIRATORY_TRACT
  Filled 2015-05-08 (×2): qty 3

## 2015-05-08 MED ORDER — FUROSEMIDE 10 MG/ML IJ SOLN
40.0000 mg | Freq: Once | INTRAMUSCULAR | Status: AC
  Administered 2015-05-08: 40 mg via INTRAVENOUS

## 2015-05-08 MED ORDER — IPRATROPIUM-ALBUTEROL 0.5-2.5 (3) MG/3ML IN SOLN
3.0000 mL | RESPIRATORY_TRACT | Status: DC
  Administered 2015-05-08 – 2015-05-10 (×15): 3 mL via RESPIRATORY_TRACT
  Filled 2015-05-08 (×15): qty 3

## 2015-05-08 MED ORDER — PIPERACILLIN-TAZOBACTAM 3.375 G IVPB
3.3750 g | Freq: Two times a day (BID) | INTRAVENOUS | Status: AC
  Administered 2015-05-08 – 2015-05-15 (×14): 3.375 g via INTRAVENOUS
  Filled 2015-05-08 (×15): qty 50

## 2015-05-08 MED ORDER — SEVELAMER CARBONATE 800 MG PO TABS
1600.0000 mg | ORAL_TABLET | Freq: Three times a day (TID) | ORAL | Status: DC
  Administered 2015-05-08 – 2015-05-18 (×22): 1600 mg via ORAL
  Filled 2015-05-08 (×24): qty 2

## 2015-05-08 MED ORDER — LEVOFLOXACIN IN D5W 750 MG/150ML IV SOLN
750.0000 mg | Freq: Once | INTRAVENOUS | Status: AC
  Administered 2015-05-08: 750 mg via INTRAVENOUS
  Filled 2015-05-08: qty 150

## 2015-05-08 MED ORDER — FENTANYL CITRATE (PF) 100 MCG/2ML IJ SOLN
INTRAMUSCULAR | Status: AC
  Administered 2015-05-08: 25 ug via INTRAVENOUS
  Filled 2015-05-08: qty 2

## 2015-05-08 MED ORDER — VANCOMYCIN HCL 500 MG IV SOLR
500.0000 mg | Freq: Once | INTRAVENOUS | Status: AC
  Administered 2015-05-08: 500 mg via INTRAVENOUS
  Filled 2015-05-08: qty 500

## 2015-05-08 NOTE — Telephone Encounter (Signed)
Thanks for the update -I will look at her hosp records

## 2015-05-08 NOTE — H&P (Signed)
Jean Tucker is an 64 y.o. female.   Chief Complaint: Shortness of breath HPI: The patient presents emergency department in respiratory distress. She states she has difficulty breathing. Notably the patient left [facility] AMA yesterday. She had been undergoing treatment for pneumonia and renal failure following TIPSS procedure. The patient received breathing treatment in the emergency department and underwent a chest x-ray which showed patchy pulmonary edema as well as likely underlying infiltrates. Kidney function dramatically worsened since initial visit 2 weeks ago prior to transfer for TIPS procedure. Due to the above findings emergency department staff called for admission.  Past Medical History  Diagnosis Date  . Allergy   . GERD (gastroesophageal reflux disease)   . CAD (coronary artery disease)   . Hyperlipidemia   . Thyroid disease   . Osteoporosis   . Rheumatoid arthritis ([place])   . Esophageal varices ([place]) 09/22/2014    April, 2016-grade 2-3 varices  . Hypothyroidism   . Cirrhosis [facility])     Past Surgical History  Procedure Laterality Date  . Abdominal hysterectomy  1983    partial,bleeding  . Rotator cuff repair      X 4  . Tendon release    . Carpal tunnel release      X 2, right  . Esophagogastroduodenoscopy N/A 12/03/2014    Procedure: ESOPHAGOGASTRODUODENOSCOPY (EGD);  Surgeon: Manya Silvas, MD;  Location: [facility] ENDOSCOPY;  Service: Endoscopy;  Laterality: N/A;  . Esophagogastroduodenoscopy N/A 04/23/2015    Procedure: UPPER ENDOSCOPY;  Surgeon: Hulen Luster, MD;  Location: [facility] ENDOSCOPY;  Service: Gastroenterology;  Laterality: N/A;    Family History  Problem Relation Age of Onset  . Diabetes Mother   . Hypertension Mother   . Alcohol abuse Father   . Ovarian cancer Mother   . Colon cancer Mother   . Colon polyps Mother   . Esophageal cancer Neg Hx   . Gallbladder disease Neg Hx   . Kidney disease Neg Hx    Social History:  reports that she  quit smoking about 5 years ago. Her smoking use included Cigarettes. She has never used smokeless tobacco. She reports that she does not drink alcohol or use illicit drugs.  Allergies:  Allergies  Allergen Reactions  . Codeine Anaphylaxis  . Arava [Leflunomide] Other (See Comments)    Pt states that this medication causes liver damage.   . Atorvastatin Other (See Comments)    Pt states that this medication causes liver damage.   . Humira [Adalimumab] Other (See Comments)    Pt states that this medication causes liver damage.   . Methotrexate Derivatives Other (See Comments)    Pt states that this medication causes liver damage.   . Simvastatin Other (See Comments)    Pt states that this medication causes liver damage and joint pain.      (Not in a hospital admission)  Results for orders placed or performed during the hospital encounter of 05/16/2015 (from the past 48 hour(s))  Basic metabolic panel     Status: Abnormal   Collection Time: 04/26/2015  2:18 AM  Result Value Ref Range   Sodium 135 135 - 145 mmol/L   Potassium 3.6 3.5 - 5.1 mmol/L   Chloride 103 101 - 111 mmol/L   CO2 21 (L) 22 - 32 mmol/L   Glucose, Bld 185 (H) 65 - 99 mg/dL   BUN 54 (H) 6 - 20 mg/dL   Creatinine, Ser 6.95 (H) 0.44 - 1.00 mg/dL  Calcium 8.7 (L) 8.9 - 10.3 mg/dL   GFR calc non Af Amer 6 (L) >60 mL/min   GFR calc Af Amer 7 (L) >60 mL/min    Comment: (NOTE) The eGFR has been calculated using the CKD EPI equation. This calculation has not been validated in all clinical situations. eGFR's persistently <60 mL/min signify possible Chronic Kidney Disease.    Anion gap 11 5 - 15  CBC     Status: Abnormal   Collection Time: 05/09/2015  2:18 AM  Result Value Ref Range   WBC 15.3 (H) 3.6 - 11.0 K/uL   RBC 3.01 (L) 3.80 - 5.20 MIL/uL   Hemoglobin 8.7 (L) 12.0 - 16.0 g/dL   HCT 27.7 (L) 35.0 - 47.0 %   MCV 92.0 80.0 - 100.0 fL   MCH 28.9 26.0 - 34.0 pg   MCHC 31.4 (L) 32.0 - 36.0 g/dL   RDW 19.8 (H) 11.5  - 14.5 %   Platelets 318 150 - 440 K/uL  Troponin I     Status: Abnormal   Collection Time: 04/30/2015  2:18 AM  Result Value Ref Range   Troponin I 0.06 (H) <0.031 ng/mL    Comment: READ BACK AND VERIFIED WITH LUIS FLORES ON 04/25/2015 AT 0301 BY QSD        PERSISTENTLY INCREASED TROPONIN VALUES IN THE RANGE OF 0.04-0.49 ng/mL CAN BE SEEN IN:       -UNSTABLE ANGINA       -CONGESTIVE HEART FAILURE       -MYOCARDITIS       -CHEST TRAUMA       -ARRYHTHMIAS       -LATE PRESENTING MYOCARDIAL INFARCTION       -COPD   CLINICAL FOLLOW-UP RECOMMENDED.   Brain natriuretic peptide     Status: Abnormal   Collection Time: 04/25/2015  2:18 AM  Result Value Ref Range   B Natriuretic Peptide 1986.0 (H) 0.0 - 100.0 pg/mL  Ammonia     Status: None   Collection Time: 05/02/2015  2:18 AM  Result Value Ref Range   Ammonia 26 9 - 35 umol/L    Comment: HEMOLYSIS AT THIS LEVEL MAY AFFECT RESULT  Lactic acid, plasma     Status: Abnormal   Collection Time: 04/24/2015  2:18 AM  Result Value Ref Range   Lactic Acid, Venous 2.8 (HH) 0.5 - 2.0 mmol/L    Comment: CRITICAL RESULT CALLED TO, READ BACK BY AND VERIFIED WITH LUIS FLORES ON 05/16/2015 AT 0303 BY QSD   Lactic acid, plasma     Status: Abnormal   Collection Time: 04/23/2015  5:39 AM  Result Value Ref Range   Lactic Acid, Venous 2.5 (HH) 0.5 - 2.0 mmol/L    Comment: CRITICAL RESULT CALLED TO, READ BACK BY AND VERIFIED WITH LUIS FLORES ON 05/22/2015 AT 8022 BY QSD    Dg Chest Port 1 View  05/07/2015  CLINICAL DATA:  Shortness of breath.  Dyspnea and hypoxia. EXAM: PORTABLE CHEST 1 VIEW COMPARISON:  04/24/2015 FINDINGS: Shallow inspiration. Cardiac enlargement. Prominent pulmonary vascularity. Bilateral perihilar and upper lobe airspace infiltrates most likely represent edema although bilateral pneumonia could have this appearance. Probable small bilateral pleural effusions. No pneumothorax. Postoperative changes in the right shoulder. IMPRESSION: Cardiac  enlargement with pulmonary vascular congestion and bilateral parenchymal airspace disease likely to represent edema although bilateral pneumonia could have this appearance. Small bilateral pleural effusions. Electronically Signed   By: Lucienne Capers M.D.   On: 05/05/2015 02:44  Review of Systems  Constitutional: Negative for fever and chills.  HENT: Negative for sore throat and tinnitus.   Eyes: Negative for blurred vision and redness.  Respiratory: Positive for shortness of breath. Negative for cough.   Cardiovascular: Negative for chest pain, palpitations, orthopnea and PND.  Gastrointestinal: Negative for nausea, vomiting, abdominal pain and diarrhea.  Genitourinary: Negative for dysuria, urgency and frequency.  Musculoskeletal: Positive for back pain. Negative for myalgias and joint pain.  Skin: Negative for rash.       No lesions  Neurological: Negative for speech change, focal weakness and weakness.  Endo/Heme/Allergies: Does not bruise/bleed easily.       No temperature intolerance  Psychiatric/Behavioral: Negative for depression and suicidal ideas.    Blood pressure 126/88, pulse 82, temperature 97.9 F (36.6 C), temperature source Oral, resp. rate 20, height _0  (1.549 m), weight 93.895 kg (207 lb), SpO2 100 %. Physical Exam  Vitals reviewed. Constitutional: She is oriented to person, place, and time. She appears well-developed and well-nourished. No distress.  HENT:  Head: Normocephalic and atraumatic.  Mouth/Throat: Oropharynx is clear and moist.  Eyes: Conjunctivae and EOM are normal. Pupils are equal, round, and reactive to light. No scleral icterus.  Neck: Normal range of motion. Neck supple. No JVD present. No tracheal deviation present. No thyromegaly present.  Cardiovascular: Normal rate, regular rhythm and normal heart sounds.  Exam reveals no gallop and no friction rub.   No murmur heard. Respiratory: Effort normal and breath sounds normal.  GI: Soft. Bowel  sounds are normal. She exhibits no distension. There is no tenderness.  Genitourinary:  Deferred  Musculoskeletal: Normal range of motion. She exhibits no edema.  Lymphadenopathy:    She has no cervical adenopathy.  Neurological: She is alert and oriented to person, place, and time. No cranial nerve deficit. She exhibits normal muscle tone.  Skin: Skin is warm and dry. No rash noted. No erythema.  Psychiatric: She has a normal mood and affect. Her behavior is normal. Judgment and thought content normal.     Assessment/Plan This is a 64 year old Caucasian female admitted for pneumonia and acute renal failure. 1. Pneumonia: Healthcare associated; he has received Levaquin IV in the emergency department. She does not have signs or symptoms of sepsis thus we will continue her antibiotics orally. Supplemental O2 as needed. 2. Acute renal failure: Labs from side hospital show fractional excretion of sodium of 2% indicating ATN but this is difficult to interpret in the setting of cirrhosis. AMA was negative as well as urine eosinophils. If the patient has hepatitis C this could certainly could lead to MPGN. Due to her recent TIPS procedure I will also check a plasma cryoglobulin as cholesterol emboli could also cause acute renal failure. Also confounding interpretation is the patient is on chronic diuretic therapy. She is received more Lasix in the emergency department for pulmonary edema. I have withheld intravenous fluid at this time. We will check with nephrology for further recommendations. 3. Cirrhosis: continue lactulose; esophageal varices status post banding and TIPS procedure 4. Encephalopathy: Ammonia level but notably it is well-known it does not correlate to her encephalopathy area continue rifaximin 5. Hypothyroidism: Continue Synthroid. Check TSH 6. Coronary artery disease: Stable; continue metoprolol 7. DVT prophylaxis: SCDs (heparin held until INR obtained) 8. GI prophylaxis: None The  patient is a full code. Time spent on admission orders and patient care approximately 45 minutes   Harrie Foreman 05/16/2015, 6:27 AM

## 2015-05-08 NOTE — Telephone Encounter (Signed)
Patient's husband called to cancel patient's appointment for today and to let Dr.Tower know that patient went to [facility] last night.  She was having trouble breathing.

## 2015-05-08 NOTE — Care Management (Signed)
Patient was inpatient at [facility] being treated for renal failure, pneumonia and TIPS procedure. She left AMA 12/16 and presented to [facility].  Being treated for pneumonia, acute renal failure.  Nephrology is involved

## 2015-05-08 NOTE — Progress Notes (Signed)
ANTIBIOTIC CONSULT NOTE - INITIAL  Pharmacy Consult for Levaquin dosing Indication: pneumonia  Allergies  Allergen Reactions  . Codeine Anaphylaxis  . Arava [Leflunomide] Other (See Comments)    Pt states that this medication causes liver damage.   . Atorvastatin Other (See Comments)    Pt states that this medication causes liver damage.   . Humira [Adalimumab] Other (See Comments)    Pt states that this medication causes liver damage.   . Methotrexate Derivatives Other (See Comments)    Pt states that this medication causes liver damage.   . Simvastatin Other (See Comments)    Pt states that this medication causes liver damage and joint pain.     Patient Measurements: Height: 5\' 1"  (154.9 cm) Weight: 207 lb (93.895 kg) IBW/kg (Calculated) : 47.8 Adjusted Body Weight: n/a  Vital Signs: Temp: 97.9 F (36.6 C) (12/16 0211) Temp Source: Oral (12/16 0211) BP: 132/95 mmHg (12/16 0500) Pulse Rate: 80 (12/16 0500) Intake/Output from previous day:   Intake/Output from this shift:    Labs:  Recent Labs  05/04/2015 0218  WBC 15.3*  HGB 8.7*  PLT 318  CREATININE 6.95*   Estimated Creatinine Clearance: 8.8 mL/min (by C-G formula based on Cr of 6.95). No results for input(s): VANCOTROUGH, VANCOPEAK, VANCORANDOM, GENTTROUGH, GENTPEAK, GENTRANDOM, TOBRATROUGH, TOBRAPEAK, TOBRARND, AMIKACINPEAK, AMIKACINTROU, AMIKACIN in the last 72 hours.   Microbiology: Recent Results (from the past 720 hour(s))  MRSA PCR Screening     Status: Abnormal   Collection Time: 04/23/15 10:10 PM  Result Value Ref Range Status   MRSA by PCR POSITIVE (A) NEGATIVE Final    Comment:        The GeneXpert MRSA Assay (FDA approved for NASAL specimens only), is one component of a comprehensive MRSA colonization surveillance program. It is not intended to diagnose MRSA infection nor to guide or monitor treatment for MRSA infections. CRITICAL RESULT CALLED TO, READ BACK BY AND VERIFIED  WITH: Tia Masker AT [id number] 04/24/15 WDM     Medical History: Past Medical History  Diagnosis Date  . Allergy   . GERD (gastroesophageal reflux disease)   . CAD (coronary artery disease)   . Hyperlipidemia   . Thyroid disease   . Osteoporosis   . Rheumatoid arthritis ([place])   . Esophageal varices ([place]) 09/22/2014    April, 2016-grade 2-3 varices  . Hypothyroidism   . Cirrhosis ([place])     Medications:   Assessment: Bilateral PNA?  Goal of Therapy:  Resolve infection  Plan:  Levaquin 750 mg IV x1 and then 500 mg IV q 48 hours ordered.  Kawan Valladolid S 05/07/2015,5:32 AM

## 2015-05-08 NOTE — Progress Notes (Signed)
RRT nurse Pam came to pts rooms to assess pt, MD Konidena in the room at this point. Per MD transfer pt to ICU. Will continue to monitor.

## 2015-05-08 NOTE — ED Notes (Signed)
Pt presents to ED via POV from personal home with c/o of SOB. Patient states she was seen at [facility] for a TIPS procedure and developed pneumonia during hospital stay. Patient states she left AMA from admitted hospital yesterday and now has developed acute SOB sx. Pt is alert and oriented x4.

## 2015-05-08 NOTE — Progress Notes (Signed)
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 Patient signed out AMA from [place] yesterday. Patient noted to have acute renal failure developed after TIPS procedure. Also has pneumonia. At this time patient is admitted for healthcare associated pneumonia, acute renal failure.  Patient is having labored breathing. Does have anasarca. Creatinine is 6.95 normal 2 weeks ago. Patient says she does not want intubation. Confirm this with the patient and nurse and the patient's husband witnessed that. Foley s inserted in the ER.   CHIEF COMPLAINT:   Chief Complaint  Patient presents with  . Shortness of Breath    REVIEW OF SYSTEMS:   ROS CONSTITUTIONAL: No fever, fatigue or weakness.  EYES: No blurred or double vision.  EARS, NOSE, AND THROAT: No tinnitus or ear pain.  RESPIRATORY: Short of breath.  CARDIOVASCULAR: has  pedal edema. GASTROINTESTINAL: No nausea, vomiting, diarrhea or abdominal pain.  GENITOURINARY: No dysuria, hematuria.  ENDOCRINE: No polyuria, nocturia,  HEMATOLOGY: No anemia, easy bruising or bleeding SKIN: No rash or lesion. MUSCULOSKELETAL: No joint pain or arthritis.   NEUROLOGIC: No tingling, numbness, weakness.  PSYCHIATRY: No anxiety or depression.   DRUG ALLERGIES:   Allergies  Allergen Reactions  . Codeine Anaphylaxis  . Arava [Leflunomide] Other (See Comments)    Pt states that this medication causes liver damage.   . Atorvastatin Other (See Comments)    Pt states that this medication causes liver damage.   . Humira [Adalimumab] Other (See Comments)    Pt states that this medication causes liver damage.   . Methotrexate Derivatives Other  (See Comments)    Pt states that this medication causes liver damage.   . Simvastatin Other (See Comments)    Pt states that this medication causes liver damage and joint pain.     VITALS:  Blood pressure 114/51, pulse 80, temperature 98.4 F (36.9 C), temperature source Oral, resp. rate 18, height 5\' 1"  (1.549 m), weight 93.895 kg (207 lb), SpO2 96 %.  PHYSICAL EXAMINATION:  GENERAL:  64 y.o.-year-old patient lying in the bed with no acute distress.  EYES: Pupils equal, round, reactive to light and accommodation. No scleral icterus. Extraocular muscles intact.  HEENT: Head atraumatic, normocephalic. Oropharynx and nasopharynx clear.  NECK:  Supple, no jugular venous distention. No thyroid enlargement, no tenderness.  LUNGS: Normal breath sounds bilaterally, no wheezing, rales,rhonchi or crepitation. No use of accessory muscles of respiration.  CARDIOVASCULAR: S1, S2 normal. No murmurs, rubs, or gallops.  ABDOMEN: Soft, nontender, nondistended. Bowel sounds present. No organomegaly or mass.  EXTREMITIES: No pedal edema, cyanosis, or clubbing.  NEUROLOGIC: Cranial nerves II through XII are intact. Muscle strength 5/5 in all extremities. Sensation intact. Gait not checked.  PSYCHIATRIC: The patient is alert and oriented x 3.  SKIN: No obvious rash, lesion, or ulcer.    LABORATORY PANEL:   CBC  Recent Labs Lab 04/29/2015 0218  WBC 15.3*  HGB 8.7*  HCT 27.7*  PLT 318   ------------------------------------------------------------------------------------------------------------------  Chemistries   Recent Labs Lab 04/29/2015 0218  NA 135  K 3.6  CL 103  CO2 21*  GLUCOSE 185*  BUN 54*  CREATININE 6.95*  CALCIUM 8.7*   ------------------------------------------------------------------------------------------------------------------  Cardiac Enzymes  Recent Labs Lab 05/03/2015 0218  TROPONINI 0.06*    ------------------------------------------------------------------------------------------------------------------  RADIOLOGY:  Dg Chest Port 1 View  05/12/2015  CLINICAL DATA:  Shortness of breath.  Dyspnea and hypoxia. EXAM: PORTABLE CHEST 1 VIEW COMPARISON:  04/24/2015 FINDINGS: Shallow inspiration. Cardiac enlargement. Prominent pulmonary vascularity. Bilateral perihilar and upper lobe airspace infiltrates most likely represent edema although bilateral pneumonia could have this appearance. Probable small bilateral pleural effusions. No pneumothorax. Postoperative changes in the right shoulder. IMPRESSION: Cardiac enlargement with pulmonary vascular congestion and bilateral parenchymal airspace disease likely to represent edema although bilateral pneumonia could have this appearance. Small bilateral pleural effusions. Electronically Signed   By: Lucienne Capers M.D.   On: 05/02/2015 02:44    EKG:   Orders placed or performed during the hospital encounter of 05/20/2015  . EKG 12-Lead  . EKG 12-Lead  . ED EKG  . ED EKG    ASSESSMENT AND PLAN:    1. Healthcare associated pneumonia: Continue Levaquin bank and Zosyn. And oxygen. Patient is having labored breathing with respiratory distress: Check ABG, she says she does not want intubation. She also told me that she will decide after speaking to her nephrologist. Transferred to CCU stepdown for close monitoring of respiratory failure and renal failure.  #2. Acute renal failure due to ATN developed after TIPS procedure possible hepatorenal syndrome. Patient has a history of autoimmune liver disease. Hold off on IV fluids, IV Lasix secondary to worsening renal failure, presented nephrology consult for possibly starting on hemodialysis.   #3 cirrhosis of the liver with a variceal bleed status post banding and tips. Patient had a history of rheumatoid arthritis with autoimmune hepatitis: Recent history of having a variceal bleed had to be  transferred from [facility] ICU to [facility] on December 2 and the patient had a TIPS procedure on December 3. Postop course was complicated by hepatic encephalopathy, nondistended elevation MI, pulmonary edema, acute kidney injury . #4 slight confusion; possible hepatic encephalopathy: Continue rifaximin, lactulose and check ammonia level.  #5. Hypothyroidism continue Synthroid.  6 history of rheumatoid arthritis: Intolerant with methotrexate, Humira,ARava Patient condition is critical secondary to acute renal failure, cirrhosis of the liver.   CODE STATUS ; full   All the records are reviewed and case discussed with Care Management/Social Workerr. Management plans discussed with the patient, family and they are in agreement.  CODE STATUS: full  TOTAL TIME TAKING CARE OF THIS PATIENT 35 minutes. CCT  POSSIBLE D/C IN 1-2 DAYS, DEPENDING ON CLINICAL CONDITION.   Epifanio Lesches M.D on 04/29/2015 at 9:04 AM  Between 7am to 6pm - Pager - [phone]  After 6pm go to [email or web address] - password [email or web address] [facility]  Office  [phone]  CC: Primary care physician; Loura Pardon, MD   Note: This dictation was prepared with Dragon dictation along with smaller phrase technology. Any transcriptional errors that result from this process are unintentional.

## 2015-05-08 NOTE — ED Notes (Signed)
Patient noted laying on the floor with foley hanging by bedside chair and pillow laying under patient's head. No evidence of acute fall noted. Patient denies pain. MD made aware. Patient advised to stay in hospital bed per policy. Patient stated "my back hurt on that bed." Patient told by this RN she will have an admitting bed shortly.

## 2015-05-08 NOTE — Procedures (Addendum)
  Procedure Note: Central Venous Catheter Placement VasCath Placement-Right Femoral Vein Theresia Bough , [id number] , IC13A/IC13A-AA  Indications: Hemodynamic monitoring / Intravenous access  Benefits, risks (including bleeding, infection,  Injury, etc.), and alternatives explained to daughter/husband who voiced understanding.  Questions were sought and answered.   Family agreed to proceed with the procedure.  Consent is signed and on chart. A time-out was completed verifying correct patient, procedure and site.  A 3 catheter available at the time of procedure.  The patient was placed in a dependent position appropriate for central line placement based on the vein to be cannulated.   The patient's RIGHT Femoral Vein was prepped and draped in a sterile fashion.  1% Lidocaine WAS used to anesthetize the surrounding skin area.   A 3 lumen catheter was introduced into the RIGHT Femoral Vein using Seldinger technique, visualized under ultrasound.  The catheter was threaded smoothly over the guide wire and appropriate blood return was obtained.  Each lumen of the catheter was evacuated of air and flushed with sterile saline.  The catheter was then sutured in place to the skin and a sterile dressing applied.  Perfusion to the extremity distal to the point of catheter insertion was checked and found to be adequate.    The patient tolerated the procedure well and there were no complications.  Vilinda Boehringer, MD [facility] Pager [phone] (please enter 7-digits) On Call Pager - [phone] (please enter 7-digits)

## 2015-05-08 NOTE — ED Provider Notes (Signed)
[facility] Emergency Department Provider Note  ____________________________________________  Time seen: 2:15 AM  I have reviewed the triage vital signs and the nursing notes.   HISTORY  Chief Complaint Shortness of Breath      HPI Jean Tucker is a 64 y.o. female presents from home withprogressive dyspnea and cough. Patient states that she left [facility] yesterday AMA. Patient states that she was being treated for pneumonia at the time at [facility]. Patient presents to the emergency department tachypnea, respiratory rate of 24, oxygen saturation 93% on 2 L on my presentation to room. Patient also admits to subjective fevers.     Past Medical History  Diagnosis Date  . Allergy   . GERD (gastroesophageal reflux disease)   . CAD (coronary artery disease)   . Hyperlipidemia   . Thyroid disease   . Osteoporosis   . Rheumatoid arthritis ([place])   . Esophageal varices ([place]) 09/22/2014    April, 2016-grade 2-3 varices  . Hypothyroidism   . Cirrhosis [facility])     Patient Active Problem List   Diagnosis Date Noted  . Upper GI bleeding 04/23/2015  . Autoimmune hepatitis ([place]) 12/17/2014  . GI bleed 12/01/2014  . Cirrhosis ([place])   . Hepatic cirrhosis ([place]) 09/22/2014  . Esophageal varices ([place]) 09/22/2014  . Heme positive stool 07/31/2014  . Acute blood loss anemia 07/31/2014  . History of peptic ulcer disease 07/25/2014  . Chronic rheumatic arthritis ([place]) 07/09/2014  . Hyponatremia 07/08/2014  . Elevated transaminase level 07/08/2014  . Anemia 07/08/2014  . Sinus congestion 07/08/2014  . Hyperglycemia 06/20/2014  . Cough 06/20/2014  . Candidal intertrigo 12/25/2013  . Vitamin D deficiency disease 06/24/2013  . Obesity 06/24/2013  . Essential hypertension, benign 03/24/2013  . Arthralgia 04/04/2012  . Knee pain 04/04/2012  . Hand pain 04/04/2012  . Routine general medical examination at a health care facility 10/13/2011  . CERVICAL  RADICULOPATHY, RIGHT 01/28/2010  . 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 Former smoker 01/02/2008  . Hypothyroidism 01/01/2008  . HYPERLIPIDEMIA 01/01/2008  . Coronary atherosclerosis 01/01/2008  . ALLERGIC RHINITIS 01/01/2008  . GERD 01/01/2008  . FIBROCYSTIC BREAST DISEASE 01/01/2008  . OSTEOPENIA 01/01/2008  . INSOMNIA 01/01/2008  . CONSTIPATION, HX OF 01/01/2008    Past Surgical History  Procedure Laterality Date  . Abdominal hysterectomy  1983    partial,bleeding  . Rotator cuff repair      X 4  . Tendon release    . Carpal tunnel release      X 2, right  . Esophagogastroduodenoscopy N/A 12/03/2014    Procedure: ESOPHAGOGASTRODUODENOSCOPY (EGD);  Surgeon: Manya Silvas, MD;  Location[facility] ENDOSCOPY;  Service: Endoscopy;  Laterality: N/A;  . Esophagogastroduodenoscopy N/A 04/23/2015    Procedure: UPPER ENDOSCOPY;  Surgeon: Hulen Luster, MD;  Location: [facility] ENDOSCOPY;  Service: Gastroenterology;  Laterality: N/A;    Current Outpatient Rx  Name  Route  Sig  Dispense  Refill  . lactulose, encephalopathy, (CHRONULAC) 10 GM/15ML SOLN   Oral   Take 30 mLs by mouth daily.         Marland Kitchen levothyroxine (SYNTHROID, LEVOTHROID) 100 MCG tablet   Oral   Take 1 tablet (100 mcg total) by mouth daily.   90 tablet   3   . metoprolol tartrate (LOPRESSOR) 25 MG tablet   Oral   Take 0.5 tablets by mouth 2 (two) times daily.         Marland Kitchen omeprazole (PRILOSEC)  20 MG capsule   Oral   Take 1 capsule by mouth daily.         Marland Kitchen PROAIR HFA 108 (90 BASE) MCG/ACT inhaler   Inhalation   Inhale 2 puffs into the lungs every 4 (four) hours as needed.           Dispense as written.   Marland Kitchen RENVELA 800 MG tablet   Oral   Take 2 tablets by mouth 3 (three) times daily with meals.           Dispense as written.   Marland Kitchen XIFAXAN 550 MG TABS tablet   Oral   Take 1 tablet by mouth 2 (two) times daily.           Dispense as written.   . zolpidem (AMBIEN) 5 MG tablet   Oral    Take 1 tablet (5 mg total) by mouth at bedtime as needed for sleep (caution of sedation). Patient not taking: Reported on 04/23/2015   90 tablet   1     Allergies Codeine; [place]; Atorvastatin; Humira; Methotrexate derivatives; and Simvastatin  Family History  Problem Relation Age of Onset  . Diabetes Mother   . Hypertension Mother   . Alcohol abuse Father   . Ovarian cancer Mother   . Colon cancer Mother   . Colon polyps Mother   . Esophageal cancer Neg Hx   . Gallbladder disease Neg Hx   . Kidney disease Neg Hx     Social History Social History  Substance Use Topics  . Smoking status: Former Smoker    Types: Cigarettes    Quit date: 05/23/2009  . Smokeless tobacco: Never Used  . Alcohol Use: No     Comment: Occassionally    Review of Systems  Constitutional: Positive for fever. Eyes: Negative for visual changes. ENT: Negative for sore throat. Cardiovascular: Negative for chest pain. Respiratory: Positive for shortness of breath and cough Gastrointestinal: Negative for abdominal pain, vomiting and diarrhea. Genitourinary: Negative for dysuria. Musculoskeletal: Negative for back pain. Skin: Negative for rash. Neurological: Negative for headaches, focal weakness or numbness.   10-point ROS otherwise negative.  ____________________________________________   PHYSICAL EXAM:  VITAL SIGNS: ED Triage Vitals  Enc Vitals Group     BP 05/16/2015 0211 186/93 mmHg     Pulse Rate 05/22/2015 0211 83     Resp 05/04/2015 0211 24     Temp 05/04/2015 0211 97.9 F (36.6 C)     Temp Source 05/07/2015 0211 Oral     SpO2 05/23/2015 0211 96 %     Weight 05/07/2015 0211 207 lb (93.895 kg)     Height 04/29/2015 0211 5\' 1"  (1.549 m)     Head Cir --      Peak Flow --      Pain Score 05/07/2015 0236 5     Pain Loc --      Pain Edu? --      Excl. in [place]? --      Constitutional: Alert and oriented. Well appearing and in no distress. Eyes: Conjunctivae are normal. PERRL. Normal extraocular  movements. ENT   Head: Normocephalic and atraumatic.   Nose: No congestion/rhinnorhea.   Mouth/Throat: Mucous membranes are moist.   Neck: No stridor. Hematological/Lymphatic/Immunilogical: No cervical lymphadenopathy. Cardiovascular: Normal rate, regular rhythm. Normal and symmetric distal pulses are present in all extremities. No murmurs, rubs, or gallops. Respiratory: Tachypnea and accessory muscle use diffuse rhonchi bilaterally  Gastrointestinal: Soft and nontender. No distention. There is no CVA  tenderness. Genitourinary: deferred Musculoskeletal: Nontender with normal range of motion in all extremities. No joint effusions. 2+ bilateral lower extremity edema  Neurologic:  Normal speech and language. No gross focal neurologic deficits are appreciated. Speech is normal.  Skin:  Skin is warm, dry and intact. No rash noted. Psychiatric: Mood and affect are normal. Speech and behavior are normal. Patient exhibits appropriate insight and judgment.  ____________________________________________    LABS (pertinent positives/negatives) Labs Reviewed  BASIC METABOLIC PANEL - Abnormal; Notable for the following:    CO2 21 (*)    Glucose, Bld 185 (*)    BUN 54 (*)    Creatinine, Ser 6.95 (*)    Calcium 8.7 (*)    GFR calc non Af Amer 6 (*)    GFR calc Af Amer 7 (*)    All other components within normal limits  CBC - Abnormal; Notable for the following:    WBC 15.3 (*)    RBC 3.01 (*)    Hemoglobin 8.7 (*)    HCT 27.7 (*)    MCHC 31.4 (*)    RDW 19.8 (*)    All other components within normal limits  TROPONIN I - Abnormal; Notable for the following:    Troponin I 0.06 (*)    All other components within normal limits  BRAIN NATRIURETIC PEPTIDE - Abnormal; Notable for the following:    B Natriuretic Peptide 1986.0 (*)    All other components within normal limits  LACTIC ACID, PLASMA - Abnormal; Notable for the following:    Lactic Acid, Venous 2.8 (*)    All other  components within normal limits  AMMONIA  LACTIC ACID, PLASMA     ____________________________________________   EKG ED ECG REPORT I, BROWN, [place] N, the attending physician, personally viewed and interpreted this ECG.   Date: 05/14/2015  EKG Time: 2:11 AM  Rate: 83  Rhythm: Normal sinus rhythm  Axis: None  Intervals: Normal  ST&T Change: None   ____________________________________________    RADIOLOGY  DG Chest Port 1 View (Final result) Result time: 05/19/2015 02:44:18   Final result by Rad Results In Interface (05/11/2015 02:44:18)   Narrative:   CLINICAL DATA: Shortness of breath. Dyspnea and hypoxia.  EXAM: PORTABLE CHEST 1 VIEW  COMPARISON: 04/24/2015  FINDINGS: Shallow inspiration. Cardiac enlargement. Prominent pulmonary vascularity. Bilateral perihilar and upper lobe airspace infiltrates most likely represent edema although bilateral pneumonia could have this appearance. Probable small bilateral pleural effusions. No pneumothorax. Postoperative changes in the right shoulder.  IMPRESSION: Cardiac enlargement with pulmonary vascular congestion and bilateral parenchymal airspace disease likely to represent edema although bilateral pneumonia could have this appearance. Small bilateral pleural effusions.   Electronically Signed By: Lucienne Capers M.D. On: 05/04/2015 02:44       Critical Care performed: CRITICAL CARE Performed by: Marjean Donna N   Total critical care time: 60 minutes  Critical care time was exclusive of separately billable procedures and treating other patients.  Critical care was necessary to treat or prevent imminent or life-threatening deterioration.  Critical care was time spent personally by me on the following activities: development of treatment plan with patient and/or surrogate as well as nursing, discussions with consultants, evaluation of patient's response to treatment, examination of patient, obtaining  history from patient or surrogate, ordering and performing treatments and interventions, ordering and review of laboratory studies, ordering and review of radiographic studies, pulse oximetry and re-evaluation of patient's condition.   ____________________________________________   INITIAL IMPRESSION / ASSESSMENT AND PLAN /  ED COURSE  Pertinent labs & imaging results that were available during my care of the patient were reviewed by me and considered in my medical decision making (see chart for details).  She received IV vancomycin and Zosyn with concern for possible hospital-acquired pneumonia with SIRS criteria patient's lactic acid 2.8, white blood cell count 15.3. Patient also with acute renal injury BUN 54 creatinine 6.95. Patient's troponin 0.06. All clinical findings were discussed with Dr. Marcille Blanco hospitalist for admission for further evaluation and management.  ____________________________________________   FINAL CLINICAL IMPRESSION(S) / ED DIAGNOSES  Final diagnoses:  HCAP (healthcare-associated pneumonia)  Acute renal failure, unspecified acute renal failure type [facility])      Gregor Hams, MD 05/07/2015 [phone]

## 2015-05-08 NOTE — Progress Notes (Signed)
ANTIBIOTIC CONSULT NOTE - INITIAL  Pharmacy Consult for Vancomycin and Zosyn Indication: HCAP  Allergies  Allergen Reactions  . Codeine Anaphylaxis  . Arava [Leflunomide] Other (See Comments)    Pt states that this medication causes liver damage.   . Atorvastatin Other (See Comments)    Pt states that this medication causes liver damage.   . Humira [Adalimumab] Other (See Comments)    Pt states that this medication causes liver damage.   . Methotrexate Derivatives Other (See Comments)    Pt states that this medication causes liver damage.   . Simvastatin Other (See Comments)    Pt states that this medication causes liver damage and joint pain.     Patient Measurements: Height: 5\' 2"  (157.5 cm) Weight: 207 lb 7.3 oz (94.1 kg) IBW/kg (Calculated) : 50.1  Vital Signs: Temp: 97.4 F (36.3 C) (12/16 1000) Temp Source: Oral (12/16 1000) BP: 129/69 mmHg (12/16 1149) Pulse Rate: 78 (12/16 1149) Intake/Output from previous day:   Intake/Output from this shift: Total I/O In: 3 [I.V.:3] Out: 500 [Urine:500]  Labs:  Recent Labs  04/27/2015 0218  WBC 15.3*  HGB 8.7*  PLT 318  CREATININE 6.95*   Estimated Creatinine Clearance: 9 mL/min (by C-G formula based on Cr of 6.95). No results for input(s): VANCOTROUGH, VANCOPEAK, VANCORANDOM, GENTTROUGH, GENTPEAK, GENTRANDOM, TOBRATROUGH, TOBRAPEAK, TOBRARND, AMIKACINPEAK, AMIKACINTROU, AMIKACIN in the last 72 hours.   Microbiology: Recent Results (from the past 720 hour(s))  MRSA PCR Screening     Status: Abnormal   Collection Time: 04/23/15 10:10 PM  Result Value Ref Range Status   MRSA by PCR POSITIVE (A) NEGATIVE Final    Comment:        The GeneXpert MRSA Assay (FDA approved for NASAL specimens only), is one component of a comprehensive MRSA colonization surveillance program. It is not intended to diagnose MRSA infection nor to guide or monitor treatment for MRSA infections. CRITICAL RESULT CALLED TO, READ BACK BY AND  VERIFIED WITH: Tia Masker AT [id number] 04/24/15 WDM     Medical History: Past Medical History  Diagnosis Date  . Allergy   . GERD (gastroesophageal reflux disease)   . CAD (coronary artery disease)   . Hyperlipidemia   . Thyroid disease   . Osteoporosis   . Rheumatoid arthritis ([place])   . Esophageal varices ([place]) 09/22/2014    April, 2016-grade 2-3 varices  . Hypothyroidism   . Cirrhosis ([place])     Medications:  Scheduled:  . ipratropium-albuterol  3 mL Nebulization Q4H  . lactulose  20 g Oral Daily  . [START ON 05/10/2015] levofloxacin (LEVAQUIN) IV  500 mg Intravenous Q48H  . levothyroxine  100 mcg Oral QAC breakfast  . metoprolol tartrate  12.5 mg Oral BID  . pantoprazole  40 mg Oral Daily  . piperacillin-tazobactam (ZOSYN)  IV  3.375 g Intravenous Q12H  . rifaximin  550 mg Oral BID  . sevelamer carbonate  1,600 mg Oral TID WC  . sodium chloride  3 mL Intravenous Q12H  . vancomycin  500 mg Intravenous Once   Infusions:    Assessment: 64 y/o F with ARF and HCAP.   Goal of Therapy:  Pre-HD vancomycin level 15-20 mcg/ml  Plan:  Patient currently on Levaquin and received one-time doses of vancomycin and Zosyn. Will continue Zosyn and vancomycin as per MD note.   Zosyn 3.375 g EI q 12 hours starting 12 hours after initial dose.   Will order vancomycin 500 mg iv once in addition to  1000 mg already given for a 1500 mg loading dose. Will order vancomycin 750 mg iv q HD. Will need to f/u plans for HD schedule. Per nephrology notes, HD planned for today and tomorrow. Will also need to scheduled vancomycin trough with the third dialysis session once HD schedule established.   Will continue to monitor renal function and culture results.   Ulice Dash D 04/26/2015,12:28 PM

## 2015-05-08 NOTE — Progress Notes (Signed)
Inpatient Diabetes Program Recommendations  AACE/ADA: New Consensus Statement on Inpatient Glycemic Control (2015)  Target Ranges:  Prepandial:   less than 140 mg/dL      Peak postprandial:   less than 180 mg/dL (1-2 hours)      Critically ill patients:  140 - 180 mg/dL   Review of Glycemic Control  Diabetes history: none documented Outpatient Diabetes medications: none noted Current orders for Inpatient glycemic control: none  Inpatient Diabetes Program Recommendations: Consider checking an A1C- last one was 6.1% in January 2016.  Some elevated CBG earlier this month at [place], RN, [place], [place], CDE Diabetes Coordinator Inpatient Diabetes Program  [phone] (Team Pager) [phone] ([place]) 05/12/2015 1:28 PM

## 2015-05-08 NOTE — Progress Notes (Signed)
Pt admitted to 1 A, with labored breathing, on 3 L of O2. MD Vianne Bulls notified, orders given for respiratory treatments.  Respiratory notified. Will continue to monitor.

## 2015-05-08 NOTE — Consult Note (Signed)
[facility] CONSULT NOTE    Date: 05/03/2015                  Patient Name:  Jean Tucker  MRN: [id number]  DOB: 03-29-1951  Age / Sex: 64 y.o., female         PCP: Loura Pardon, MD                 Service Requesting Consult: Dr. Marcille Blanco                 Reason for Consult: Acute renal failure s/p TIPS procedure at [facility]            History of Present Illness: Patient is a 64 y.o. 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 Patient was last seen here on 04/23/2015 for hematemesis. Patient has known grade 2-3 esophageal varices.  She was subsequently transferred to [facility] and had TIPS procedure. We do not have discharge summary available to [place] at this point in time. However it appears that she developed severe acute renal failure. Her EGFR yesterday was found to be 6. The patient signed herself out [place] yesterday and subsequently came here for shortness of breath. Creatinine currently is 6.95 with an EGFR of 6. Lactic acid is also elevated at 2.5. Patient currently unclear as to the etiology of her acute renal failure. She was apparently seen by nephrology at [facility]. She was transferred to the critical care unit this a.m. for increasing shortness of breath.   Medications: Outpatient medications: Prescriptions prior to admission  Medication Sig Dispense Refill Last Dose  . lactulose, encephalopathy, (CHRONULAC) 10 GM/15ML SOLN Take 30 mLs by mouth daily.   05/07/2015 at Unknown time  . levothyroxine (SYNTHROID, LEVOTHROID) 100 MCG tablet Take 1 tablet (100 mcg total) by mouth daily. 90 tablet 3 05/07/2015 at Unknown time  . metoprolol tartrate (LOPRESSOR) 25 MG tablet Take 0.5 tablets by mouth 2 (two)  times daily.   05/07/2015 at Unknown time  . omeprazole (PRILOSEC) 20 MG capsule Take 1 capsule by mouth daily.   05/07/2015 at Unknown time  . PROAIR HFA 108 (90 BASE) MCG/ACT inhaler Inhale 2 puffs into the lungs every 4 (four) hours as needed for wheezing.    prn at prn  . RENVELA 800 MG tablet Take 2 tablets by mouth 3 (three) times daily with meals.   05/07/2015 at Unknown time  . XIFAXAN 550 MG TABS tablet Take 1 tablet by mouth 2 (two) times daily.   05/07/2015 at Unknown time  . zolpidem (AMBIEN) 5 MG tablet Take 1 tablet (5 mg total) by mouth at bedtime as needed for sleep (caution of sedation). 90 tablet 1 prn at prn    Current medications: Current Facility-Administered Medications  Medication Dose Route Frequency Provider Last Rate Last Dose  . ipratropium-albuterol (DUONEB) 0.5-2.5 (3) MG/3ML nebulizer solution 3 mL  3 mL Nebulization Q2H PRN Epifanio Lesches, MD      . ipratropium-albuterol (DUONEB) 0.5-2.5 (3) MG/3ML nebulizer solution 3 mL  3 mL Nebulization Q4H Epifanio Lesches, MD   3 mL at 04/25/2015 0811  . [START ON 05/10/2015] levofloxacin (LEVAQUIN) IVPB 500 mg  500 mg Intravenous Q48H Harrie Foreman, MD          Allergies:  Allergies  Allergen Reactions  . Codeine Anaphylaxis  . Arava [Leflunomide] Other (See Comments)    Pt states that this medication causes liver damage.   . Atorvastatin Other (See Comments)    Pt states that this medication causes liver damage.   . Humira [Adalimumab] Other (See Comments)    Pt states that this medication causes liver damage.   . Methotrexate Derivatives Other (See Comments)    Pt states that this medication causes liver damage.   . Simvastatin Other (See Comments)    Pt states that this medication causes liver damage and joint pain.       Past Medical History: Past Medical History  Diagnosis Date  . Allergy   . GERD (gastroesophageal reflux disease)   . CAD (coronary artery disease)   . Hyperlipidemia   .  Thyroid disease   . Osteoporosis   . Rheumatoid arthritis ([place])   . Esophageal varices ([place]) 09/22/2014    April, 2016-grade 2-3 varices  . Hypothyroidism   . Cirrhosis [facility])      Past Surgical History: Past Surgical History  Procedure Laterality Date  . Abdominal hysterectomy  1983    partial,bleeding  . Rotator cuff repair      X 4  . Tendon release    . Carpal tunnel release      X 2, right  . Esophagogastroduodenoscopy N/A 12/03/2014    Procedure: ESOPHAGOGASTRODUODENOSCOPY (EGD);  Surgeon: Manya Silvas, MD;  Location: [facility] ENDOSCOPY;  Service: Endoscopy;  Laterality: N/A;  . Esophagogastroduodenoscopy N/A 04/23/2015    Procedure: UPPER ENDOSCOPY;  Surgeon: Hulen Luster, MD;  Location: [facility] ENDOSCOPY;  Service: Gastroenterology;  Laterality: N/A;     Family History: Family History  Problem Relation Age of Onset  . Diabetes Mother   . Hypertension Mother   . Alcohol abuse Father   . Ovarian cancer Mother   . Colon cancer Mother   . Colon polyps Mother   . Esophageal cancer Neg Hx   . Gallbladder disease Neg Hx   . Kidney disease Neg Hx      Social History: Social History   Social History  . Marital Status: Married    Spouse Name: N/A  . Number of Children: 3  . Years of Education: N/A   Occupational History  . Retired    Social History Main Topics  . Smoking status: Former Smoker    Types: Cigarettes    Quit date: 05/23/2009  . Smokeless tobacco: Never Used  . Alcohol Use: No     Comment: Occassionally  . Drug Use: No  . Sexual Activity: Not on file   Other Topics Concern  . Not on file   Social History Narrative     Review of Systems: Review of Systems  Constitutional: Positive for malaise/fatigue. Negative for fever and chills.  HENT: Negative for hearing loss and nosebleeds.   Eyes: Negative for blurred vision and double vision.  Respiratory: Positive for shortness of breath. Negative for cough and hemoptysis.   Cardiovascular: Positive  for orthopnea. Negative for chest pain and leg swelling.  Gastrointestinal: Positive for nausea. Negative for heartburn, vomiting and abdominal pain.  Genitourinary: Negative for dysuria and urgency.  Musculoskeletal: Negative for myalgias.  Skin: Negative for rash.  Neurological: Negative for dizziness, focal weakness and headaches.  Endo/Heme/Allergies: Negative for polydipsia. Does not bruise/bleed easily.  Psychiatric/Behavioral: Negative for depression. The patient is not nervous/anxious.      Vital Signs: Blood pressure 114/51, pulse 80, temperature  98.4 F (36.9 C), temperature source Oral, resp. rate 18, height '5\' 1"'$  (1.549 m), weight 93.895 kg (207 lb), SpO2 96 %.  Weight trends: Filed Weights   04/27/2015 0211  Weight: 93.895 kg (207 lb)    Physical Exam: General: NAD, sitting up in bed  Head: Normocephalic, atraumatic.  Eyes: Anicteric, EOMI  Nose: Mucous membranes moist, not inflammed, nonerythematous.  Throat: Oral cavity dry, no oral ulcerations  Neck: Supple, trachea midline.  Lungs:  Basilar rales noted  Heart: RRR. S1 and S2 normal without gallop, murmur, or rubs.  Abdomen:  Soft NT, distension noted, BS present  Extremities: No pretibial edema.  Neurologic: Awake, alert, follows commands  Skin: No visible rashes, scars.    Lab results: Basic Metabolic Panel:  Recent Labs Lab 05/06/2015 0218  NA 135  K 3.6  CL 103  CO2 21*  GLUCOSE 185*  BUN 54*  CREATININE 6.95*  CALCIUM 8.7*    Liver Function Tests: No results for input(s): AST, ALT, ALKPHOS, BILITOT, PROT, ALBUMIN in the last 168 hours. No results for input(s): LIPASE, AMYLASE in the last 168 hours.  Recent Labs Lab 05/03/2015 0218  AMMONIA 26    CBC:  Recent Labs Lab 04/28/2015 0218  WBC 15.3*  HGB 8.7*  HCT 27.7*  MCV 92.0  PLT 318    Cardiac Enzymes:  Recent Labs Lab 05/01/2015 0218  TROPONINI 0.06*    BNP: Invalid input(s): POCBNP  CBG: No results for input(s): GLUCAP  in the last 168 hours.  Microbiology: Results for orders placed or performed during the hospital encounter of 04/23/15  MRSA PCR Screening     Status: Abnormal   Collection Time: 04/23/15 10:10 PM  Result Value Ref Range Status   MRSA by PCR POSITIVE (A) NEGATIVE Final    Comment:        The GeneXpert MRSA Assay (FDA approved for NASAL specimens only), is one component of a comprehensive MRSA colonization surveillance program. It is not intended to diagnose MRSA infection nor to guide or monitor treatment for MRSA infections. CRITICAL RESULT CALLED TO, READ BACK BY AND VERIFIED WITH: Tia Masker AT [id number] 04/24/15 WDM     Coagulation Studies:  Recent Labs  05/13/2015 0218  LABPROT 15.9*  INR 1.26    Urinalysis:  Recent Labs  05/09/2015 0605  COLORURINE YELLOW*  LABSPEC 1.010  PHURINE 5.0  GLUCOSEU NEGATIVE  HGBUR 1+*  BILIRUBINUR NEGATIVE  KETONESUR NEGATIVE  PROTEINUR NEGATIVE  NITRITE NEGATIVE  LEUKOCYTESUR TRACE*      Imaging: Dg Chest Port 1 View  04/28/2015  CLINICAL DATA:  Shortness of breath.  Dyspnea and hypoxia. EXAM: PORTABLE CHEST 1 VIEW COMPARISON:  04/24/2015 FINDINGS: Shallow inspiration. Cardiac enlargement. Prominent pulmonary vascularity. Bilateral perihilar and upper lobe airspace infiltrates most likely represent edema although bilateral pneumonia could have this appearance. Probable small bilateral pleural effusions. No pneumothorax. Postoperative changes in the right shoulder. IMPRESSION: Cardiac enlargement with pulmonary vascular congestion and bilateral parenchymal airspace disease likely to represent edema although bilateral pneumonia could have this appearance. Small bilateral pleural effusions. Electronically Signed   By: Lucienne Capers M.D.   On: 05/04/2015 02:44      Assessment & Plan: Pt is a 64 y.o. female with a PMHx of GERD, hyperlipidemia, osteoporosis, rheumatoid arthritis, cirrhosis of the liver secondary to autoimmune  process, esophageal varices status post TIPS procedure, who was admitted to [facility] on 04/28/2015 for evaluation of shortness of breath.  1.  Acute renal failure unspecified. 2.  Liver cirrhosis (autoimmune in etiology) with history of esophogeal varices s/p TIPS at [facility] 04/25/15.  3.  Anemia unspecified. 4.  Rheumatoid arthritis.  Plan:  The patient presents with a very interesting case. She has normal kidney function at baseline. She is status post TIPS procedure on 04/25/2015. I have just discussed the case with the nephrology fellow at [facility] medical center. The impression was that she had acute tubular necrosis however hepatorenal syndrome was also in the differential. Given her very low renal function now we will proceed with a course of renal replacement therapy. We feel at this time that her blood pressure will tolerate conventional dialysis. I have requested pulmonary critical care to place a temporary dialysis catheter. We will plan for dialysis for 1.5 hours today with a blood flow rate of 150, dialysate flow rate of 300, with ultrafiltration target of 0.5 kg. We will reassess her for dialysis tomorrow as well. We will also obtain urine labs to determine the possibility of underlying hepatorenal syndrome. She had received albumin while she was at [facility]. This was a highly complex case and required significant data gathering from an outside facility.

## 2015-05-08 NOTE — Progress Notes (Signed)
Report called to Clyde Canterbury, RN in ICU.

## 2015-05-08 NOTE — Consult Note (Signed)
PULMONARY / CRITICAL CARE MEDICINE   Name: Jean Tucker MRN: [id number] DOB: 1950-06-15    ADMISSION DATE:  04/27/2015 CONSULTATION DATE: 04/30/2015 REFERRING MD :  Dr. Vianne Bulls   CHIEF COMPLAINT:   Short of breath, altered mental status Reason  For consult - vascath placement, SOB   HISTORY OF PRESENT ILLNESS  Patient altered and confused - history per chart review and daughter at bedside. 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  The patient presents emergency department in respiratory distress. She states she has difficulty breathing. Notably the patient left [facility] AMA yesterday. She had been undergoing treatment for pneumonia and renal failure following TIPS procedure. The patient received breathing treatment in the emergency department and underwent a chest x-ray which showed patchy pulmonary edema as well as likely underlying infiltrates. Kidney function dramatically worsened since initial visit 2 weeks ago prior to transfer for TIPS procedure.  Overnight, she was admitted to the medical floor, but noted to have worsening mental status changes and worsening respiratory status in the form of using accessory muscles. Patient was then transferred to the ICU to for further monitoring, given her acute renal failure she was evaluated and deemed a candidate for urgent dialysis. At the time of my evaluation patient with lucid periods, and somnolent at times, able to verify above history.    SIGNIFICANT EVENTS   04/23/15 - recent admission to [facility] for PNA, ARF, Cirrhosis, transferred to [facility] for TIPS 05/06/14 - left [facility] AMA 04/24/15- TIPS at [facility] 05/04/2015 - represented to [facility] with SOB and AMS with ARF requiring urgent dialysis    PAST MEDICAL HISTORY    :  Past Medical History  Diagnosis  Date  . Allergy   . GERD (gastroesophageal reflux disease)   . CAD (coronary artery disease)   . Hyperlipidemia   . Thyroid disease   . Osteoporosis   . Rheumatoid arthritis ([place])   . Esophageal varices ([place]) 09/22/2014    April, 2016-grade 2-3 varices  . Hypothyroidism   . Cirrhosis [facility])    Past Surgical History  Procedure Laterality Date  . Abdominal hysterectomy  1983    partial,bleeding  . Rotator cuff repair      X 4  . Tendon release    . Carpal tunnel release      X 2, right  . Esophagogastroduodenoscopy N/A 12/03/2014    Procedure: ESOPHAGOGASTRODUODENOSCOPY (EGD);  Surgeon: Manya Silvas, MD;  Location: [facility] ENDOSCOPY;  Service: Endoscopy;  Laterality: N/A;  . Esophagogastroduodenoscopy N/A 04/23/2015    Procedure: UPPER ENDOSCOPY;  Surgeon: Hulen Luster, MD;  Location: [facility] ENDOSCOPY;  Service: Gastroenterology;  Laterality: N/A;   Prior to Admission medications   Medication Sig Start Date End Date Taking? Authorizing Provider  lactulose, encephalopathy, (CHRONULAC) 10 GM/15ML SOLN Take 30 mLs by mouth daily. 05/07/15 05/17/15 Yes Historical Provider, MD  levothyroxine (SYNTHROID, LEVOTHROID) 100 MCG tablet Take 1 tablet (100 mcg total) by mouth daily. 12/09/14  Yes Abner Greenspan, MD  metoprolol tartrate (LOPRESSOR) 25 MG tablet Take 0.5 tablets by mouth 2 (two) times daily. 05/07/15  Yes Historical Provider, MD  omeprazole (PRILOSEC) 20 MG capsule Take 1 capsule by mouth daily. 12/09/14  Yes Historical Provider, MD  PROAIR HFA 108 (90 BASE) MCG/ACT inhaler Inhale 2 puffs into the lungs every 4 (four) hours as needed for wheezing.  05/07/15  Yes Historical Provider, MD  RENVELA 800 MG tablet Take 2 tablets by mouth 3 (three) times daily with meals. 05/07/15  Yes Historical Provider, MD  XIFAXAN 550 MG TABS tablet Take 1 tablet by mouth 2 (two) times daily. 05/07/15  Yes Historical Provider, MD  zolpidem (AMBIEN) 5 MG tablet Take 1 tablet (5 mg total) by mouth at bedtime as  needed for sleep (caution of sedation). 12/17/14  Yes Abner Greenspan, MD   Allergies  Allergen Reactions  . Codeine Anaphylaxis  . Arava [Leflunomide] Other (See Comments)    Pt states that this medication causes liver damage.   . Atorvastatin Other (See Comments)    Pt states that this medication causes liver damage.   . Humira [Adalimumab] Other (See Comments)    Pt states that this medication causes liver damage.   . Methotrexate Derivatives Other (See Comments)    Pt states that this medication causes liver damage.   . Simvastatin Other (See Comments)    Pt states that this medication causes liver damage and joint pain.      FAMILY HISTORY   Family History  Problem Relation Age of Onset  . Diabetes Mother   . Hypertension Mother   . Alcohol abuse Father   . Ovarian cancer Mother   . Colon cancer Mother   . Colon polyps Mother   . Esophageal cancer Neg Hx   . Gallbladder disease Neg Hx   . Kidney disease Neg Hx       SOCIAL HISTORY    reports that she quit smoking about 5 years ago. Her smoking use included Cigarettes. She has never used smokeless tobacco. She reports that she does not drink alcohol or use illicit drugs.  Review of Systems  Constitutional: Negative for fever, chills and weight loss.  HENT: Negative for hearing loss.   Eyes: Negative for blurred vision and double vision.  Respiratory: Positive for shortness of breath. Negative for stridor.   Cardiovascular: Positive for palpitations, orthopnea and leg swelling.  Gastrointestinal: Negative for heartburn, nausea, diarrhea and constipation.  Genitourinary: Negative for dysuria.  Musculoskeletal: Negative for myalgias.  Skin: Positive for rash. Negative for itching.  Neurological: Negative for dizziness and headaches.  Endo/Heme/Allergies: Does not bruise/bleed easily.      VITAL SIGNS    Temp:  [97.4 F (36.3 C)-98.4 F (36.9 C)] 97.4 F (36.3 C) (12/16 1000) Pulse Rate:  [78-85] 78 (12/16  1149) Resp:  [18-27] 27 (12/16 1000) BP: (114-186)/(51-95) 129/69 mmHg (12/16 1149) SpO2:  [93 %-100 %] 97 % (12/16 1000) FiO2 (%):  [32 %] 32 % (12/16 0811) Weight:  [207 lb (93.895 kg)-207 lb 7.3 oz (94.1 kg)] 207 lb 7.3 oz (94.1 kg) (12/16 1000) HEMODYNAMICS:   VENTILATOR SETTINGS: Vent Mode:  [-]  FiO2 (%):  [32 %] 32 % INTAKE / OUTPUT:  Intake/Output Summary (Last 24 hours) at 05/12/2015 1257 Last data filed at 05/06/2015 1000  Gross per 24 hour  Intake      3 ml  Output    500 ml  Net   -497 ml       PHYSICAL EXAM   Physical Exam  Constitutional: She appears well-developed and well-nourished.  HENT:  Head: Normocephalic and atraumatic.  Right Ear: External ear normal.  Left Ear: External ear normal.  Nose: Nose normal.  Mouth/Throat: Oropharynx is clear and moist. No oropharyngeal exudate.  Eyes: Pupils are equal, round, and reactive to light. Left eye exhibits no discharge.  Neck: Normal range of motion. Neck supple. No tracheal deviation present. No thyromegaly present.  Cardiovascular: Normal rate, normal heart sounds and intact distal pulses.   Pulmonary/Chest: Breath sounds normal. No stridor. She has no wheezes. She has no rales. She exhibits no tenderness.  Mild tachypnea, but no use of accessory muscles  Abdominal: Soft. She exhibits distension.  Genitourinary: Vagina normal.  Musculoskeletal: Normal range of motion. She exhibits no edema or tenderness.  Lymphadenopathy:    She has cervical adenopathy.  Neurological: She is alert. No cranial nerve deficit. Coordination normal.  Intermittent periods of lucid Mild confusion Mild somnolence, easily awakened  Skin: Skin is warm and dry.  Multiple spider nevi noted on anterior chest wall  Nursing note and vitals reviewed.      LABS   LABS:  CBC  Recent Labs Lab 05/17/2015 0218  WBC 15.3*  HGB 8.7*  HCT 27.7*  PLT 318   Coag's  Recent Labs Lab 05/03/2015 0218  INR 1.26   BMET  Recent  Labs Lab 05/19/2015 0218  NA 135  K 3.6  CL 103  CO2 21*  BUN 54*  CREATININE 6.95*  GLUCOSE 185*   Electrolytes  Recent Labs Lab 05/15/2015 0218  CALCIUM 8.7*   Sepsis Markers  Recent Labs Lab 05/07/2015 0218 05/15/2015 0539  LATICACIDVEN 2.8* 2.5*   ABG  Recent Labs Lab 05/20/2015 0950  PHART 7.35  PCO2ART 39  PO2ART 68*   Liver Enzymes No results for input(s): AST, ALT, ALKPHOS, BILITOT, ALBUMIN in the last 168 hours. Cardiac Enzymes  Recent Labs Lab 05/20/2015 0218  TROPONINI 0.06*   Glucose  Recent Labs Lab 05/15/2015 0953  GLUCAP 152*     No results found for this or any previous visit (from the past 240 hour(s)).   Current facility-administered medications:  .  acetaminophen (TYLENOL) tablet 650 mg, 650 mg, Oral, Q6H PRN, Flora Lipps, MD .  ipratropium-albuterol (DUONEB) 0.5-2.5 (3) MG/3ML nebulizer solution 3 mL, 3 mL, Nebulization, Q2H PRN, Epifanio Lesches, MD .  ipratropium-albuterol (DUONEB) 0.5-2.5 (3) MG/3ML nebulizer solution 3 mL, 3 mL, Nebulization, Q4H, Epifanio Lesches, MD, 3 mL at 05/12/2015 1212 .  lactulose (CHRONULAC) 10 GM/15ML solution 20 g, 20 g, Oral, Daily, Harrie Foreman, MD, 20 g at 05/06/2015 1149 .  [START ON 05/10/2015] levofloxacin (LEVAQUIN) IVPB 500 mg, 500 mg, Intravenous, Q48H, Harrie Foreman, MD .  levothyroxine (SYNTHROID, LEVOTHROID) tablet 100 mcg, 100 mcg, Oral, QAC breakfast, Harrie Foreman, MD, 100 mcg at 05/20/2015 1150 .  metoprolol tartrate (LOPRESSOR) tablet 12.5 mg, 12.5 mg, Oral, BID, Harrie Foreman, MD, 12.5 mg at 04/23/2015 1149 .  ondansetron (ZOFRAN) tablet 4 mg, 4 mg, Oral, Q6H PRN **OR** ondansetron (ZOFRAN) injection 4 mg, 4 mg, Intravenous, Q6H PRN, Harrie Foreman, MD .  pantoprazole (PROTONIX) EC tablet 40 mg, 40 mg, Oral, Daily, Harrie Foreman, MD, 40 mg at 05/16/2015 1150 .  piperacillin-tazobactam (ZOSYN) IVPB 3.375 g, 3.375 g, Intravenous, Q12H, Epifanio Lesches, MD .  rifaximin  Doreene Nest) tablet 550 mg, 550 mg, Oral, BID, Harrie Foreman, MD .  sevelamer carbonate (RENVELA) tablet 1,600 mg, 1,600 mg, Oral, TID WC, Harrie Foreman, MD .  sodium chloride 0.9 % injection 3 mL, 3 mL, Intravenous, Q12H, Harrie Foreman, MD, 3 mL at 05/16/2015 1000 .  vancomycin (VANCOCIN) 500 mg in sodium chloride 0.9 % 100 mL IVPB, 500 mg, Intravenous, Once, Epifanio Lesches, MD .  vancomycin (VANCOCIN) IVPB 750 mg/150 ml premix, 750  mg, Intravenous, Q dialysis, Epifanio Lesches, MD .  zolpidem (AMBIEN) tablet 5 mg, 5 mg, Oral, QHS PRN, Harrie Foreman, MD  IMAGING    US Renal  05/09/2015  CLINICAL DATA:  Acute renal failure EXAM: RENAL / URINARY TRACT ULTRASOUND COMPLETE COMPARISON:  CT 08/28/2014 FINDINGS: Right Kidney: Length: 12.7 cm. Echogenicity within normal limits. No mass or hydronephrosis visualized. Left Kidney: Length: 14.0 cm. Echogenicity within normal limits. No mass or hydronephrosis visualized. Bladder: Foley catheter in place, decompressed and not well visualized. IMPRESSION: No acute findings. Electronically Signed   By: Rolm Baptise M.D.   On: 04/23/2015 11:39   Dg Chest Port 1 View  04/26/2015  CLINICAL DATA:  Shortness of breath.  Dyspnea and hypoxia. EXAM: PORTABLE CHEST 1 VIEW COMPARISON:  04/24/2015 FINDINGS: Shallow inspiration. Cardiac enlargement. Prominent pulmonary vascularity. Bilateral perihilar and upper lobe airspace infiltrates most likely represent edema although bilateral pneumonia could have this appearance. Probable small bilateral pleural effusions. No pneumothorax. Postoperative changes in the right shoulder. IMPRESSION: Cardiac enlargement with pulmonary vascular congestion and bilateral parenchymal airspace disease likely to represent edema although bilateral pneumonia could have this appearance. Small bilateral pleural effusions. Electronically Signed   By: Lucienne Capers M.D.   On: 05/05/2015 02:44      Indwelling Urinary Catheter  continued, requirement due to   Reason to continue Indwelling Urinary Catheter for strict Intake/Output monitoring for hemodynamic instability   Central Line continued, requirement due to   Reason to continue [employer] Monitoring of central venous pressure or other hemodynamic parameters   Ventilator continued, requirement due to, resp failure    Ventilator Sedation RASS 0 to -2   Cultures: BCx2  UC  Sputum  Antibiotics: Vanc 12/16>> Levaquin 12/16>>  Lines: 12/16 R Fem Vascath>>  ASSESSMENT/PLAN  64 year old female past medical history of hyperlipidemia, autoimmune liver cirrhosis, recent TIPS, now with acute renal failure requiring urgent dialysis  PULMONARY Dyspnea, mild respiratory distress Suspected PNA - HCAP  P:   - Secondary to possible acute renal failure, mild fluid overload -Continue with supplemental oxygen, maintaining O2 saturations greater 90% -Monitor ins and outs -Anticipate improvement after dialysis -Bronchodilators as needed -Currently being treated as HCAP with Levaquin and vancomycin, I reviewed her chest x-ray findings are more consistent with pulmonary vascular congestion versus bilateral basilar pneumonia.  CARDIOVASCULAR CVL - R Fem Vascath P:  -Monitor hemodynamics  RENAL Acute renal failure Anemia Necessity for Vas-Cath placement P:   -Patient with normal kidney function after about 2 weeks ago, now with creatinine of 6.95 and EGFR of 6, necessitating urgent dialysis -Nephrology following, appreciate recommendations -Continue monitoring ins and outs -Avoid any nephrotoxic drugs -Monitor CBC  GASTROINTESTINAL Autoimmune liver cirrhosis status post TIPS procedure on 12/2 History of esophageal varices P:   -Continue to monitor LFTs -Avoid any drugs such as NSAIDs second worsening liver function  HEMATOLOGIC Anemia P:  Monitor CBC  INFECTIOUS ?HCAP - Chest x-ray findings more with pulmonary vascular congestion vs  bilateral pneumonia, however does have significant increased white blood cell count. -Will treat as pneumonia at the current time continue Levaquin and vancomycin -check sputum culture and blood culture  ENDOCRINE ICU hypo/hypglycemic protocol  NEUROLOGIC AMS, consfusion P:   RASS goal: 0 Secondary to ARF - cont with dialysis    I have personally obtained a history, examined the patient, evaluated laboratory and imaging results, formulated the assessment and plan and placed orders.  The Patient requires high complexity decision making for assessment and support,  frequent evaluation and titration of therapies, application of advanced monitoring technologies and extensive interpretation of multiple databases. Critical Care Time devoted to patient care services described in this note is 45 minutes.   Overall, patient is critically ill, prognosis is guarded. Patient at high risk for cardiac arrest and death.   Vilinda Boehringer, MD  Pulmonary and Critical Care Pager [phone] (please enter 7-digits) On Call Pager [phone] (please enter 7-digits)     05/01/2015, 12:57 PM  Note: This note was prepared with Dragon dictation along with smaller phrase technology. Any transcriptional errors that result from this process are unintentional.

## 2015-05-08 NOTE — Significant Event (Signed)
Rapid Response Event Note  Overview:    Called by patient RN, Ana, to come and "look at patient".  Patient had recently been at [facility] for TIPS procedure, signed herself out AMA yesterday. During pm, patient husband stated she had become more SOB at home and patient was brought back to ED.  Initial Focused Assessment:   When entered room,  patient lying in bed, receiving breathing treatment. Patient alert and oriented. Husband at bedside. B/P 135/72, SATs 96% on 3L n/c. After breathing txt patient states she is breathing easier. Lungs clear, diminished. At this time will continue to monitor and follow. Patient has nephrology consult. Interventions: Spoke with Dr. Holley Raring, nephrology, reviewed patient labs. Spoke with Dr. Vianne Bulls by telephone, patient will be transferred as Stepdown to room 13  Event Summary: Name of Physician Notified: Lateef at 0850    at    Outcome: Stayed in room and stabalized     Jean Tucker

## 2015-05-09 ENCOUNTER — Inpatient Hospital Stay: Payer: BLUE CROSS/BLUE SHIELD

## 2015-05-09 DIAGNOSIS — R0600 Dyspnea, unspecified: Secondary | ICD-10-CM

## 2015-05-09 DIAGNOSIS — I480 Paroxysmal atrial fibrillation: Secondary | ICD-10-CM

## 2015-05-09 LAB — CBC
HEMATOCRIT: 24.9 % — AB (ref 35.0–47.0)
Hemoglobin: 7.8 g/dL — ABNORMAL LOW (ref 12.0–16.0)
MCH: 29 pg (ref 26.0–34.0)
MCHC: 31.5 g/dL — ABNORMAL LOW (ref 32.0–36.0)
MCV: 92.1 fL (ref 80.0–100.0)
Platelets: 212 10*3/uL (ref 150–440)
RBC: 2.7 MIL/uL — ABNORMAL LOW (ref 3.80–5.20)
RDW: 19.6 % — AB (ref 11.5–14.5)
WBC: 13.2 10*3/uL — AB (ref 3.6–11.0)

## 2015-05-09 LAB — BASIC METABOLIC PANEL
Anion gap: 10 (ref 5–15)
BUN: 48 mg/dL — AB (ref 6–20)
CHLORIDE: 105 mmol/L (ref 101–111)
CO2: 25 mmol/L (ref 22–32)
Calcium: 8.7 mg/dL — ABNORMAL LOW (ref 8.9–10.3)
Creatinine, Ser: 5.91 mg/dL — ABNORMAL HIGH (ref 0.44–1.00)
GFR calc Af Amer: 8 mL/min — ABNORMAL LOW (ref >60)
GFR calc non Af Amer: 7 mL/min — ABNORMAL LOW (ref >60)
GLUCOSE: 131 mg/dL — AB (ref 65–99)
POTASSIUM: 4.2 mmol/L (ref 3.5–5.1)
SODIUM: 140 mmol/L (ref 135–145)

## 2015-05-09 LAB — TROPONIN I: Troponin I: 0.03 ng/mL (ref <0.031)

## 2015-05-09 LAB — HEMOGLOBIN A1C: Hgb A1c MFr Bld: 5.2 % (ref 4.0–6.0)

## 2015-05-09 LAB — CKMB (ARMC ONLY): CK, MB: 1.8 ng/mL (ref 0.5–5.0)

## 2015-05-09 LAB — HEPATITIS B CORE ANTIBODY, TOTAL: HEP B C TOTAL AB: NEGATIVE

## 2015-05-09 LAB — HEPATITIS C ANTIBODY

## 2015-05-09 LAB — HEPATITIS B SURFACE ANTIBODY, QUANTITATIVE: Hep B S AB Quant (Post): <3.1 m[IU]/mL — ABNORMAL LOW (ref >9.9)

## 2015-05-09 LAB — HEPATITIS B SURFACE ANTIGEN: HEP B S AG: NEGATIVE

## 2015-05-09 MED ORDER — AEROCHAMBER PLUS FLO-VU MEDIUM MISC: 1.0000 | Freq: Once | Status: DC

## 2015-05-09 MED ORDER — MOMETASONE FURO-FORMOTEROL FUM 200-5 MCG/ACT IN AERO
2.0000 | INHALATION_SPRAY | Freq: Two times a day (BID) | RESPIRATORY_TRACT | Status: DC
  Administered 2015-05-09 – 2015-05-10 (×4): 2 via RESPIRATORY_TRACT
  Filled 2015-05-09: qty 8.8

## 2015-05-09 MED ORDER — CETYLPYRIDINIUM CHLORIDE 0.05 % MT LIQD
7.0000 mL | Freq: Two times a day (BID) | OROMUCOSAL | Status: DC
  Administered 2015-05-09 – 2015-05-20 (×19): 7 mL via OROMUCOSAL

## 2015-05-09 MED ORDER — OPTICHAMBER DIAMOND MISC
1.0000 | Freq: Once | Status: DC
  Filled 2015-05-09: qty 1

## 2015-05-09 MED ORDER — ENSURE ENLIVE PO LIQD
237.0000 mL | Freq: Two times a day (BID) | ORAL | Status: DC
  Administered 2015-05-09 – 2015-05-10 (×4): 237 mL via ORAL

## 2015-05-09 NOTE — Progress Notes (Signed)
[facility]  ROUNDING NOTE   Subjective:   Hemodialysis yesterday. Initial session. Tolerated treatment well.  Family at bedside. Patient more alert and oriented. Breathing room air UOP 1500.   No UF.   Complains of right ear pain  Objective:  Vital signs in last 24 hours:  Temp:  [97.3 F (36.3 C)-98.6 F (37 C)] 98.2 F (36.8 C) (12/17 0730) Pulse Rate:  [65-124] 124 (12/17 1000) Resp:  [13-29] 28 (12/17 1000) BP: (86-129)/(42-69) 114/57 mmHg (12/17 1000) SpO2:  [92 %-100 %] 97 % (12/17 1000) Weight:  [93.9 kg (207 lb 0.2 oz)-96.1 kg (211 lb 13.8 oz)] 93.9 kg (207 lb 0.2 oz) (12/17 0459)  Weight change: 0.206 kg (7.3 oz) Filed Weights   05/07/2015 1530 04/24/2015 1700 05/09/15 0459  Weight: 96.1 kg (211 lb 13.8 oz) 95.2 kg (209 lb 14.1 oz) 93.9 kg (207 lb 0.2 oz)    Intake/Output: I/O last 3 completed shifts: In: 153 [I.V.:3; IV Piggyback:150] Out: 2000 [Urine:1500; Other:500]   Intake/Output this shift:  Total I/O In: 50 [IV Piggyback:50] Out: -   Physical Exam: General: NAD  Head: Right ear with clean tympanic membrane. Moist oral mucosal membranes  Eyes: Anicteric, PERRL  Neck: Supple, trachea midline  Lungs:  Clear to auscultation  Heart: Regular rate and rhythm  Abdomen:  Soft, nontender  Extremities: Trace dependent and peripheral edema.  Neurologic: Nonfocal, moving all four extremities  Skin: No lesions  Access: Right femoral vascath 12/16 Dr. Stevenson Clinch    Basic Metabolic Panel:  Recent Labs Lab 05/13/2015 0218 05/09/15 0630  NA 135 140  K 3.6 4.2  CL 103 105  CO2 21* 25  GLUCOSE 185* 131*  BUN 54* 48*  CREATININE 6.95* 5.91*  CALCIUM 8.7* 8.7*    Liver Function Tests: No results for input(s): AST, ALT, ALKPHOS, BILITOT, PROT, ALBUMIN in the last 168 hours. No results for input(s): LIPASE, AMYLASE in the last 168 hours.  Recent Labs Lab 04/24/2015 0218  AMMONIA 26    CBC:  Recent Labs Lab 05/01/2015 0218 05/09/15 0630   WBC 15.3* 13.2*  HGB 8.7* 7.8*  HCT 27.7* 24.9*  MCV 92.0 92.1  PLT 318 212    Cardiac Enzymes:  Recent Labs Lab 05/18/2015 0218  TROPONINI 0.06*    BNP: Invalid input(s): POCBNP  CBG:  Recent Labs Lab 05/05/2015 0953  GLUCAP 152*    Microbiology: Results for orders placed or performed during the hospital encounter of 05/02/2015  Culture, blood (Routine X 2) w Reflex to ID Panel     Status: None (Preliminary result)   Collection Time: 05/18/2015  2:18 AM  Result Value Ref Range Status   Specimen Description BLOOD LEFT ASSIST CONTROL  Final   Special Requests BOTTLES DRAWN AEROBIC AND ANAEROBIC 5 CC  Final   Culture NO GROWTH < 12 HOURS  Final   Report Status PENDING  Incomplete  Culture, blood (Routine X 2) w Reflex to ID Panel     Status: None (Preliminary result)   Collection Time: 05/12/2015  2:18 AM  Result Value Ref Range Status   Specimen Description BLOOD RIGHT ASSIST CONTROL  Final   Special Requests BOTTLES DRAWN AEROBIC AND ANAEROBIC 1 CC  Final   Culture NO GROWTH < 12 HOURS  Final   Report Status PENDING  Incomplete  Urine culture     Status: None (Preliminary result)   Collection Time: 05/23/2015  6:05 AM  Result Value Ref Range Status   Specimen Description URINE,  RANDOM  Final   Special Requests NONE  Final   Culture NO GROWTH < 24 HOURS  Final   Report Status PENDING  Incomplete    Coagulation Studies:  Recent Labs  05/03/2015 0218  LABPROT 15.9*  INR 1.26    Urinalysis:  Recent Labs  05/03/2015 0605  COLORURINE YELLOW*  LABSPEC 1.010  PHURINE 5.0  GLUCOSEU NEGATIVE  HGBUR 1+*  BILIRUBINUR NEGATIVE  KETONESUR NEGATIVE  PROTEINUR NEGATIVE  NITRITE NEGATIVE  LEUKOCYTESUR TRACE*      Imaging: US Renal  05/09/2015  CLINICAL DATA:  Acute renal failure EXAM: RENAL / URINARY TRACT ULTRASOUND COMPLETE COMPARISON:  CT 08/28/2014 FINDINGS: Right Kidney: Length: 12.7 cm. Echogenicity within normal limits. No mass or hydronephrosis visualized.  Left Kidney: Length: 14.0 cm. Echogenicity within normal limits. No mass or hydronephrosis visualized. Bladder: Foley catheter in place, decompressed and not well visualized. IMPRESSION: No acute findings. Electronically Signed   By: Rolm Baptise M.D.   On: 04/26/2015 11:39   Dg Chest Port 1 View  05/09/2015  CLINICAL DATA:  Respiratory distress EXAM: PORTABLE CHEST - 1 VIEW COMPARISON:  05/12/2015 FINDINGS: Cardiac shadow is stable. Patchy upper lobe infiltrates are again seen with some mild improvement when compare with the prior exam. Overall improved aeration is noted. IMPRESSION: Persistent but somewhat improved upper lobe infiltrates bilaterally. Electronically Signed   By: Inez Catalina M.D.   On: 05/09/2015 11:23   Dg Chest Port 1 View  04/27/2015  CLINICAL DATA:  Shortness of breath.  Dyspnea and hypoxia. EXAM: PORTABLE CHEST 1 VIEW COMPARISON:  04/24/2015 FINDINGS: Shallow inspiration. Cardiac enlargement. Prominent pulmonary vascularity. Bilateral perihilar and upper lobe airspace infiltrates most likely represent edema although bilateral pneumonia could have this appearance. Probable small bilateral pleural effusions. No pneumothorax. Postoperative changes in the right shoulder. IMPRESSION: Cardiac enlargement with pulmonary vascular congestion and bilateral parenchymal airspace disease likely to represent edema although bilateral pneumonia could have this appearance. Small bilateral pleural effusions. Electronically Signed   By: Lucienne Capers M.D.   On: 04/29/2015 02:44     Medications:     . feeding supplement (ENSURE ENLIVE)  237 mL Oral BID BM  . fentaNYL (SUBLIMAZE) injection  100 mcg Intravenous Once  . heparin subcutaneous  5,000 Units Subcutaneous Q12H  . ipratropium-albuterol  3 mL Nebulization Q4H  . lactulose  20 g Oral Daily  . [START ON 05/10/2015] levofloxacin (LEVAQUIN) IV  500 mg Intravenous Q48H  . levothyroxine  100 mcg Oral QAC breakfast  . metoprolol tartrate   12.5 mg Oral BID  . pantoprazole  40 mg Oral Daily  . piperacillin-tazobactam (ZOSYN)  IV  3.375 g Intravenous Q12H  . rifaximin  550 mg Oral BID  . sevelamer carbonate  1,600 mg Oral TID WC  . sodium chloride  3 mL Intravenous Q12H   acetaminophen, ipratropium-albuterol, ondansetron **OR** ondansetron (ZOFRAN) IV, vancomycin, zolpidem  Assessment/ Plan:  Jean Tucker is a 64 y.o. 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  1. Acute renal failure: concern for acute hepatorenal syndrome versus ATN. Hemodialysis yesterday due to worsening renal function. Nonoliguric urine output.  Electrolytes are at goal.  No acute indication for dialysis. Continue to monitor for dialysis need. Monitor volume status, urine output, electrolytes and renal function.  Creatinine continues to be critically elevated.    2.  Liver cirrhosis (autoimmune in etiology) with history of esophogeal varices s/p TIPS at [facility] 04/25/15.  - rifaximin and lactulose for hepatic encephalopathy  3. Anemia unspecified. Hemoglobin 7.8.  - consider epo  4. Pneumonia: leukocytosis improving. Afebrile.  - empiric zosyn, vanco and levofloxacin  5. Hypertension: blood pressure at goal - metoprolol  6. Secondary Hyperparathyroidism - phosphorus from 12/15 is 6.3 - currently on sevelamer.  - recheck phosphorus and PTH.    LOS: [place], [place] 12/17/201611:33 AM

## 2015-05-09 NOTE — Clinical Documentation Improvement (Signed)
Internal Medicine Critical Care  Can the term "pulmonary edema" be further specified?   Acute pulmonary edema  Chronic pulmonary edema  Acute on chronic pulmonary edeam  Other  Clinically Undetermined  Supporting Information: -- Respiratory distress with accessory muscle use transferred from floor to CCU Stepdown, CXR showing pulmonary edema on admit treated with Lasix IV in ED, required emergent dialysis after admit, BNP 1986 with anasarca  Please exercise your independent, professional judgment when responding. A specific answer is not anticipated or expected.  Thank You, Ezekiel Ina RN [place] [phone]

## 2015-05-09 NOTE — Progress Notes (Signed)
Pt slept most of night, no acute issues. VSS, husband updated prior to going home.

## 2015-05-09 NOTE — Progress Notes (Signed)
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 Patient signed out AMA from [place] yesterday. Patient noted to have acute renal failure developed after TIPS procedure. Also has pneumonia. At this time patient is admitted for healthcare associated pneumonia, acute renal failure.   CHIEF COMPLAINT:   Chief Complaint  Patient presents with  . Shortness of Breath   Short of breath. Substernal chest pain. Tachycardia.  REVIEW OF SYSTEMS:   Review of Systems  Constitutional: Positive for malaise/fatigue. Negative for fever.  Respiratory: Positive for shortness of breath and wheezing.   Cardiovascular: Positive for chest pain and palpitations.  Gastrointestinal: Negative for nausea, vomiting and abdominal pain.  Genitourinary: Negative for dysuria.    DRUG ALLERGIES:   Allergies  Allergen Reactions  . Codeine Anaphylaxis  . Arava [Leflunomide] Other (See Comments)    Pt states that this medication causes liver damage.   . Atorvastatin Other (See Comments)    Pt states that this medication causes liver damage.   . Humira [Adalimumab] Other (See Comments)    Pt states that this medication causes liver damage.   . Methotrexate Derivatives Other (See Comments)    Pt states that this medication causes liver damage.   . Simvastatin Other (See Comments)    Pt states that this medication causes liver damage and joint pain.     VITALS:  Blood pressure 131/83, pulse 129, temperature 98.2 F (36.8 C), temperature source Oral, resp. rate 31, height 5\' 2"  (1.575 m), weight 93.9 kg (207 lb 0.2 oz), SpO2 94 %.  PHYSICAL EXAMINATION:  GENERAL:  64 y.o.-year-old patient lying in the  bed, uncomfortable, pale LUNGS: Short shallow respirations, upper airway wheezes, no rhonchi or rails, mild respiratory distress CARDIOVASCULAR: S1, S2 normal. No murmurs, rubs, or gallops. Tachycardic, irregular ABDOMEN: Soft, nontender, nondistended. Bowel sounds present. No organomegaly or mass.  EXTREMITIES: No cyanosis, or clubbing. 2+ bilateral pedal edema NEUROLOGIC: Cranial nerves II through XII are intact. Muscle strength 5/5 in all extremities. Sensation intact. Gait not checked.  PSYCHIATRIC: The patient is alert and oriented x 3. Anxious SKIN: No obvious rash, lesion, or ulcer.    LABORATORY PANEL:   CBC  Recent Labs Lab 05/09/15 0630  WBC 13.2*  HGB 7.8*  HCT 24.9*  PLT 212   ------------------------------------------------------------------------------------------------------------------  Chemistries   Recent Labs Lab 05/09/15 0630  NA 140  K 4.2  CL 105  CO2 25  GLUCOSE 131*  BUN 48*  CREATININE 5.91*  CALCIUM 8.7*   ------------------------------------------------------------------------------------------------------------------  Cardiac Enzymes  Recent Labs Lab 05/09/15 1300  TROPONINI 0.03   ------------------------------------------------------------------------------------------------------------------  RADIOLOGY:  US Renal  05/16/2015  CLINICAL DATA:  Acute renal failure EXAM: RENAL / URINARY TRACT ULTRASOUND COMPLETE COMPARISON:  CT 08/28/2014 FINDINGS: Right Kidney: Length: 12.7 cm. Echogenicity within normal limits. No mass or hydronephrosis visualized. Left Kidney: Length: 14.0 cm. Echogenicity within normal limits. No mass or hydronephrosis visualized. Bladder: Foley catheter in place, decompressed and not well visualized. IMPRESSION: No acute findings. Electronically Signed   By: Rolm Baptise M.D.   On: 04/30/2015 11:39   Dg Chest Port 1 View  05/09/2015  CLINICAL DATA:  Respiratory distress EXAM: PORTABLE CHEST - 1  VIEW COMPARISON:   05/13/2015 FINDINGS: Cardiac shadow is stable. Patchy upper lobe infiltrates are again seen with some mild improvement when compare with the prior exam. Overall improved aeration is noted. IMPRESSION: Persistent but somewhat improved upper lobe infiltrates bilaterally. Electronically Signed   By: Inez Catalina M.D.   On: 05/09/2015 11:23   Dg Chest Port 1 View  04/29/2015  CLINICAL DATA:  Shortness of breath.  Dyspnea and hypoxia. EXAM: PORTABLE CHEST 1 VIEW COMPARISON:  04/24/2015 FINDINGS: Shallow inspiration. Cardiac enlargement. Prominent pulmonary vascularity. Bilateral perihilar and upper lobe airspace infiltrates most likely represent edema although bilateral pneumonia could have this appearance. Probable small bilateral pleural effusions. No pneumothorax. Postoperative changes in the right shoulder. IMPRESSION: Cardiac enlargement with pulmonary vascular congestion and bilateral parenchymal airspace disease likely to represent edema although bilateral pneumonia could have this appearance. Small bilateral pleural effusions. Electronically Signed   By: Lucienne Capers M.D.   On: 05/20/2015 02:44    EKG:   Orders placed or performed during the hospital encounter of 04/29/2015  . EKG 12-Lead  . EKG 12-Lead  . ED EKG  . ED EKG  . EKG 12-Lead  . EKG 12-Lead    ASSESSMENT AND PLAN:    1. Healthcare associated pneumonia:  - Cultured data is negative - Continue broad-spectrum antibiotics including Levaquin, Zosyn, vancomycin  #2. Acute renal failure due to ATN  - Appreciate nephrology following - Received HD yesterday, may need additional treatments and his creatinine continues to be critically elevated and she does seem volume overloaded  #3 cirrhosis of the liver with a variceal bleed status post banding and tips. Patient had a history of rheumatoid arthritis with autoimmune hepatitis: Recent history of having a variceal bleed had to be transferred from [facility] ICU to [facility] on December 2 and the patient had a TIPS procedure on December 3. Postop course was complicated by hepatic encephalopathy, nondistended elevation MI, pulmonary edema, acute kidney injury . #4 slight confusion; possible hepatic encephalopathy: Continue rifaximin, lactulose and check ammonia level.  #5. Hypothyroidism continue Synthroid.  6 history of rheumatoid arthritis: Intolerant to methotrexate, Humira,ARava  7. Atrial fibrillation: - Continue monitoring in ICU, heart rate now controlled  CODE STATUS ; full   All the records are reviewed and case discussed with Care Management/Social Workerr. Management plans discussed with the patient, family and they are in agreement.  CODE STATUS: full  TOTAL TIME TAKING CARE OF THIS PATIENT 35 minutes. CCT  POSSIBLE D/C IN 1-2 DAYS, DEPENDING ON CLINICAL CONDITION.   Myrtis Ser M.D on 05/09/2015 at 3:00 PM  Between 7am to 6pm - Pager - [phone]  After 6pm go to [email or web address] - password [email or web address] [facility]  Office  [phone]  CC: Primary care physician; Loura Pardon, MD   Note: This dictation was prepared with Dragon dictation along with smaller phrase technology. Any transcriptional errors that result from this process are unintentional.

## 2015-05-09 NOTE — Progress Notes (Addendum)
Dr. Stevenson Clinch made aware that patient converted to afib on monitor this morning- EKG ordered showing afib with rvr- Troponins and CKMB ordered- Dr. Volanda Napoleon updated. Vitals stable at this time. AFIB in 110's-120's.

## 2015-05-09 NOTE — Progress Notes (Signed)
Initial Nutrition Assessment     INTERVENTION:  Meals and snacks: Cater to pt prefrences. Called kitchen for lunch order Medical Nutrition Supplement Therapy: Agree with ensure order at this time BID.  Will monitor intake and renal labs, may need to switch to nepro   NUTRITION DIAGNOSIS:   Inadequate oral intake related to acute illness as evidenced by per patient/family report.    GOAL:   Patient will meet greater than or equal to 90% of their needs    MONITOR:    (Energy intake, Electrolyte and renal profile)  REASON FOR ASSESSMENT:   Consult Poor PO  ASSESSMENT:      Pt admitted with ARF requiring emergent dialysis, pneumonia, pulmonary edema.  Recent admission at [facility]   Past Medical History  Diagnosis Date  . Allergy   . GERD (gastroesophageal reflux disease)   . CAD (coronary artery disease)   . Hyperlipidemia   . Thyroid disease   . Osteoporosis   . Rheumatoid arthritis ([place])   . Esophageal varices ([place]) 09/22/2014    April, 2016-grade 2-3 varices  . Hypothyroidism   . Cirrhosis ([place])     Current Nutrition: nothing this am, few sips of ensure but did not really like it  Food/Nutrition-Related History: pt reports for the past 2 weeks no real appetite secondary to not feeling well   Scheduled Medications:  . feeding supplement (ENSURE ENLIVE)  237 mL Oral BID BM  . fentaNYL (SUBLIMAZE) injection  100 mcg Intravenous Once  . heparin subcutaneous  5,000 Units Subcutaneous Q12H  . ipratropium-albuterol  3 mL Nebulization Q4H  . lactulose  20 g Oral Daily  . [START ON 05/10/2015] levofloxacin (LEVAQUIN) IV  500 mg Intravenous Q48H  . levothyroxine  100 mcg Oral QAC breakfast  . metoprolol tartrate  12.5 mg Oral BID  . pantoprazole  40 mg Oral Daily  . piperacillin-tazobactam (ZOSYN)  IV  3.375 g Intravenous Q12H  . rifaximin  550 mg Oral BID  . sevelamer carbonate  1,600 mg Oral TID WC  . sodium chloride  3 mL Intravenous Q12H          Electrolyte/Renal Profile and Glucose Profile:   Recent Labs Lab 05/07/2015 0218 05/09/15 0630  NA 135 140  K 3.6 4.2  CL 103 105  CO2 21* 25  BUN 54* 48*  CREATININE 6.95* 5.91*  CALCIUM 8.7* 8.7*  GLUCOSE 185* 131*    Gastrointestinal Profile: Last BM: 12/16   Nutrition-Focused Physical Exam Findings: Nutrition-Focused physical exam completed. Findings are WDL for fat depletion, muscle depletion, and edema.     Weight Change: wt gain noted per wt encounters    Diet Order:  Diet renal with fluid restriction Fluid restriction:: 1200 mL Fluid; Room service appropriate?: Yes; Fluid consistency:: Thin  Skin:   reviewed   Height:   Ht Readings from Last 1 Encounters:  04/27/2015 5\' 2"  (1.575 m)    Weight:   Wt Readings from Last 1 Encounters:  05/09/15 207 lb 0.2 oz (93.9 kg)    Ideal Body Weight:     BMI:  Body mass index is 37.85 kg/(m^2).  Estimated Nutritional Needs:   Kcal:  BEE 1013 kcals (IF 1.0-1.3, AF 1.3) 1316-1711 kcals/d.   Protein:  (1.0-1.2 g/kg) 50-60 g/d  Fluid:  (1052ml + urine output)  EDUCATION NEEDS:   No education needs identified at this time  [place]. Zenia Resides, [place], [place] (pager)

## 2015-05-09 NOTE — Progress Notes (Signed)
ANTIBIOTIC CONSULT NOTE -follow up  Pharmacy Consult for Vancomycin and Zosyn Indication: HCAP  Allergies  Allergen Reactions  . Codeine Anaphylaxis  . Arava [Leflunomide] Other (See Comments)    Pt states that this medication causes liver damage.   . Atorvastatin Other (See Comments)    Pt states that this medication causes liver damage.   . Humira [Adalimumab] Other (See Comments)    Pt states that this medication causes liver damage.   . Methotrexate Derivatives Other (See Comments)    Pt states that this medication causes liver damage.   . Simvastatin Other (See Comments)    Pt states that this medication causes liver damage and joint pain.     Patient Measurements: Height: 5\' 2"  (157.5 cm) Weight: 207 lb 0.2 oz (93.9 kg) IBW/kg (Calculated) : 50.1  Vital Signs: Temp: 98.2 F (36.8 C) (12/17 1200) Temp Source: Oral (12/17 1200) BP: 131/83 mmHg (12/17 1400) Pulse Rate: 129 (12/17 1400) Intake/Output from previous day: 12/16 0701 - 12/17 0700 In: 153 [I.V.:3; IV Piggyback:150] Out: 2000 [Urine:1500] Intake/Output from this shift: Total I/O In: 50 [IV Piggyback:50] Out: 500 [Urine:500]  Labs:  Recent Labs  05/11/2015 0218 04/27/2015 1120 05/09/15 0630  WBC 15.3*  --  13.2*  HGB 8.7*  --  7.8*  PLT 318  --  212  LABCREA  --  52  49  --   CREATININE 6.95*  --  5.91*   Estimated Creatinine Clearance: 10.5 mL/min (by C-G formula based on Cr of 5.91).   Microbiology: Recent Results (from the past 720 hour(s))  MRSA PCR Screening     Status: Abnormal   Collection Time: 04/23/15 10:10 PM  Result Value Ref Range Status   MRSA by PCR POSITIVE (A) NEGATIVE Final    Comment:        The GeneXpert MRSA Assay (FDA approved for NASAL specimens only), is one component of a comprehensive MRSA colonization surveillance program. It is not intended to diagnose MRSA infection nor to guide or monitor treatment for MRSA infections. CRITICAL RESULT CALLED TO, READ BACK  BY AND VERIFIED WITH: Tia Masker AT 0026 04/24/15 WDM   Culture, blood (Routine X 2) w Reflex to ID Panel     Status: None (Preliminary result)   Collection Time: 05/03/2015  2:18 AM  Result Value Ref Range Status   Specimen Description BLOOD LEFT ASSIST CONTROL  Final   Special Requests BOTTLES DRAWN AEROBIC AND ANAEROBIC 5 CC  Final   Culture NO GROWTH < 12 HOURS  Final   Report Status PENDING  Incomplete  Culture, blood (Routine X 2) w Reflex to ID Panel     Status: None (Preliminary result)   Collection Time: 05/04/2015  2:18 AM  Result Value Ref Range Status   Specimen Description BLOOD RIGHT ASSIST CONTROL  Final   Special Requests BOTTLES DRAWN AEROBIC AND ANAEROBIC 1 CC  Final   Culture NO GROWTH < 12 HOURS  Final   Report Status PENDING  Incomplete  Urine culture     Status: None (Preliminary result)   Collection Time: 05/11/2015  6:05 AM  Result Value Ref Range Status   Specimen Description URINE, RANDOM  Final   Special Requests NONE  Final   Culture NO GROWTH < 24 HOURS  Final   Report Status PENDING  Incomplete    Medical History: Past Medical History  Diagnosis Date  . Allergy   . GERD (gastroesophageal reflux disease)   . CAD (coronary artery disease)   .  Hyperlipidemia   . Thyroid disease   . Osteoporosis   . Rheumatoid arthritis ([place])   . Esophageal varices ([place]) 09/22/2014    April, 2016-grade 2-3 varices  . Hypothyroidism   . Cirrhosis ([place])     Medications:  Scheduled:  . antiseptic oral rinse  7 mL Mouth Rinse BID  . feeding supplement (ENSURE ENLIVE)  237 mL Oral BID BM  . fentaNYL (SUBLIMAZE) injection  100 mcg Intravenous Once  . heparin subcutaneous  5,000 Units Subcutaneous Q12H  . ipratropium-albuterol  3 mL Nebulization Q4H  . lactulose  20 g Oral Daily  . [START ON 05/10/2015] levofloxacin (LEVAQUIN) IV  500 mg Intravenous Q48H  . levothyroxine  100 mcg Oral QAC breakfast  . metoprolol tartrate  12.5 mg Oral BID  . mometasone-formoterol   2 puff Inhalation BID  . optichamber diamond  1 each Other Once  . pantoprazole  40 mg Oral Daily  . piperacillin-tazobactam (ZOSYN)  IV  3.375 g Intravenous Q12H  . rifaximin  550 mg Oral BID  . sevelamer carbonate  1,600 mg Oral TID WC  . sodium chloride  3 mL Intravenous Q12H   Infusions:    Assessment: 64 y/o F with ARF and HCAP. Temporary Dialysis catheter placed.  Goal of Therapy:  Pre-HD vancomycin level 15-20 mcg/ml  Plan:  Patient currently on Levaquin and received one-time doses of vancomycin and Zosyn. Will continue Zosyn and vancomycin as per MD note.  Zosyn 3.375 g EI q 12 hours starting 12 hours after initial dose.   Will order vancomycin 500 mg iv once in addition to 1000 mg already given for a 1500 mg loading dose. Will order vancomycin 750 mg iv q HD. Will need to f/u plans for HD schedule. Per nephrology notes, HD planned for today and tomorrow. Will also need to scheduled vancomycin trough with the third dialysis session once HD schedule established.   12/17: Patient on Day 2 of Zosyn, Vancomycin, and Levaquin.  Ucx,Bcx NGTD. No HD today per nephrology. Vancomycin ordered PRN with HD-follow for next HD.  Chinita Greenland PharmD Clinical Pharmacist 05/09/2015  2:37 PM

## 2015-05-09 NOTE — Progress Notes (Addendum)
PULMONARY / CRITICAL CARE MEDICINE   Name: Jean Tucker MRN: [id number] DOB: 07-Jan-1951    ADMISSION DATE:  05/15/2015  BRIEF HISTORY: Patient a past medical history of GERD, hyperlipidemia, osteoporosis, rheumatoid arthritis, REB and liver cirrhosis status, residual varices status post recent TIPS procedure at [place] on 05/07/2015, presented to [facility] on 05/17/2015 with shortness of breath, altered mental status, acute renal failure, requiring urgent dialysis.  SUBJECTIVE:  This morning patient states that she is doing well, her mentation is appropriate, she is back to baseline mentation now. Patient had hemodialysis yesterday, 0.5 L removed. This morning noted to be mildly tachypnea, and having intermittent periods of atrial fibrillation with heart rate into the 130s. However he was medically stable. Further history not a patient is more alert, states that she smokes 6-7 cigarettes per day for about 51 years, quit 6 years ago.  STUDIES:  04/23/15 - recent admission to [facility] for PNA, ARF, Cirrhosis, transferred to [facility] for TIPS 05/06/14 - left [facility] AMA 04/24/15- TIPS at [facility] 04/26/2015 - represented to [facility] with SOB and AMS with ARF requiring urgent dialysis 05/09/15 - mentation back to baseline  VITAL SIGNS: Temp:  [97.3 F (36.3 C)-98.6 F (37 C)] 98.2 F (36.8 C) (12/17 0730) Pulse Rate:  [65-124] 124 (12/17 1000) Resp:  [13-29] 28 (12/17 1000) BP: (86-129)/(42-69) 114/57 mmHg (12/17 1000) SpO2:  [92 %-100 %] 97 % (12/17 1000) Weight:  [207 lb 0.2 oz (93.9 kg)-211 lb 13.8 oz (96.1 kg)] 207 lb 0.2 oz (93.9 kg) (12/17 0459) HEMODYNAMICS:   VENTILATOR SETTINGS:   INTAKE / OUTPUT:  Intake/Output Summary (Last 24 hours) at 05/09/15 1134 Last data filed at 05/09/15 1000  Gross per 24 hour  Intake    100 ml  Output   1500 ml  Net  -1400 ml    Review of Systems  Constitutional: Negative for fever, chills, weight loss and malaise/fatigue.  HENT:  Negative for hearing loss.   Eyes: Negative for blurred vision.  Respiratory: Negative for cough, hemoptysis, sputum production, shortness of breath and wheezing.   Cardiovascular: Negative for chest pain, palpitations, orthopnea and leg swelling.  Gastrointestinal: Negative for heartburn and nausea.  Neurological: Negative for headaches.  Endo/Heme/Allergies: Does not bruise/bleed easily.  Psychiatric/Behavioral: Negative for depression. The patient is not nervous/anxious.     Physical Exam  Constitutional: She is oriented to person, place, and time and well-developed, well-nourished, and in no distress.  HENT:  Head: Normocephalic and atraumatic.  Right Ear: External ear normal.  Left Ear: External ear normal.  Nose: Nose normal.  Mouth/Throat: Oropharynx is clear and moist. No oropharyngeal exudate.  Eyes: Pupils are equal, round, and reactive to light.  Neck: Normal range of motion. Neck supple. No JVD present. No tracheal deviation present. No thyromegaly present.  Cardiovascular: Exam reveals no friction rub.   No murmur heard. Irregular, irregular  Pulmonary/Chest: No stridor. She is in respiratory distress. She has no wheezes. She has no rales. She exhibits no tenderness.  Good breath sounds and respiratory effort, noted to be in mild tachypnea, not using any accessory muscles.  Musculoskeletal: Normal range of motion. She exhibits no edema.  Neurological: She is alert and oriented to person, place, and time.  Skin: Skin is warm and dry. There is erythema.  Psychiatric: Memory, affect and judgment normal.  Nursing note and vitals reviewed.    LABS:  CBC  Recent Labs Lab 05/06/2015 0218 05/09/15 0630  WBC 15.3* 13.2*  HGB 8.7*  7.8*  HCT 27.7* 24.9*  PLT 318 212   Coag's  Recent Labs Lab 05/23/2015 0218  INR 1.26   BMET  Recent Labs Lab 04/27/2015 0218 05/09/15 0630  NA 135 140  K 3.6 4.2  CL 103 105  CO2 21* 25  BUN 54* 48*  CREATININE 6.95* 5.91*   GLUCOSE 185* 131*   Electrolytes  Recent Labs Lab 05/02/2015 0218 05/09/15 0630  CALCIUM 8.7* 8.7*   Sepsis Markers  Recent Labs Lab 05/22/2015 0218 05/14/2015 0539  LATICACIDVEN 2.8* 2.5*   ABG  Recent Labs Lab 05/15/2015 0950  PHART 7.35  PCO2ART 39  PO2ART 68*   Liver Enzymes No results for input(s): AST, ALT, ALKPHOS, BILITOT, ALBUMIN in the last 168 hours. Cardiac Enzymes  Recent Labs Lab 05/09/2015 0218  TROPONINI 0.06*   Glucose  Recent Labs Lab 05/15/2015 0953  GLUCAP 152*    Imaging Dg Chest Port 1 View  05/09/2015  CLINICAL DATA:  Respiratory distress EXAM: PORTABLE CHEST - 1 VIEW COMPARISON:  05/06/2015 FINDINGS: Cardiac shadow is stable. Patchy upper lobe infiltrates are again seen with some mild improvement when compare with the prior exam. Overall improved aeration is noted. IMPRESSION: Persistent but somewhat improved upper lobe infiltrates bilaterally. Electronically Signed   By: Inez Catalina M.D.   On: 05/09/2015 11:23    Cultures: BCx2  UC  Sputum  Antibiotics: Vanc 12/16>> Levaquin 12/16>>  Lines: 12/16 R Fem Vascath>>  ASSESSMENT / PLAN:  64 year old female past medical history of hyperlipidemia, autoimmune liver cirrhosis, recent TIPS, with acute renal failure requiring urgent dialysis, now with improving status  PULMONARY Dyspnea, mild respiratory distress Suspected PNA - HCAP ?COPD  P:  - Secondary to possible acute renal failure, mild fluid overload -Continue with supplemental oxygen, maintaining O2 saturations greater 90% -Monitor ins and outs -Anticipate improvement after dialysis -Bronchodilators as needed -Currently being treated as HCAP with Levaquin and vancomycin, I reviewed her chest x-ray findings, they are more consistent with pulmonary vascular congestion versus bilateral basilar pneumonia. -Patient had an episode of sustained atrial fibrillation this morning, being monitored -Prior to hospitalization showed  prolonged smoking history, dyspneic with climbing 1 flight of stairs, questionable clinical COPD, will start on Dulera  CARDIOVASCULAR CVL - R Fem Vascath Paroxysmal Atrial Fibrillation - new onset P:  -Monitor hemodynamics -12-lead EKG shows atrial fibrillation, heart rate less than 1:30 -We'll monitor atrial fibrillation for now, we'll tolerate heart rate less than 1:30 along with stable hemodynamics -Check cardiac enzymes and chest x-ray again -Atrial fibrillation most likely secondary to underlying renal failure and possible HCAP - cont with HD and antibiotics - may give dose of cardizem is afib persist with hemodynamic instability  RENAL Acute renal failure Anemia Necessity for Vas-Cath placement P:  -Patient with normal kidney function after about 2 weeks ago, now with creatinine of 6.95 and EGFR of 6, necessitating urgent dialysis -Nephrology following, appreciate recommendations -Continue monitoring ins and outs -Avoid any nephrotoxic drugs -Monitor CBC  GASTROINTESTINAL Autoimmune liver cirrhosis status post TIPS procedure on 12/2 History of esophageal varices P:  -Continue to monitor LFTs -Avoid any drugs such as NSAIDs second worsening liver function  HEMATOLOGIC Anemia P:  Monitor CBC  INFECTIOUS ?HCAP - Chest x-ray findings more with pulmonary vascular congestion vs bilateral pneumonia, however does have significant increased white blood cell count. -Will treat as pneumonia at the current time continue Levaquin and vancomycin -check sputum culture and blood culture  ENDOCRINE ICU hypo/hypglycemic protocol  NEUROLOGIC AMS, consfusion-resolving,  back to baseline mentation P:  RASS goal: 0 Secondary to ARF - cont with dialysis   Patient making great improvement in overall clinical status, scheduled to have another dialysis session this afternoon, if hemodynamically stable without any worsening mentation, then can transfer to medical floor.  I  have personally obtained a history, examined the patient, evaluated laboratory and imaging results, formulated the assessment and plan and placed orders.  Critical Care Time devoted to patient care services described in this note is 45 minutes.   Overall, patient is critically ill, prognosis is guarded. Patient at high risk for cardiac arrest and death.   Vilinda Boehringer, MD [facility] Pager [phone] (please enter 7-digits) On Call Pager - [phone] (please enter 7-digits)   Thank you for consulting [facility], Please feel free to contacts [place] with any questions at [phone] (please enter 7-digits).    Note: This note was prepared with Dragon dictation along with smaller phrase technology. Any transcriptional errors that result from this process are unintentional.

## 2015-05-10 DIAGNOSIS — J449 Chronic obstructive pulmonary disease, unspecified: Secondary | ICD-10-CM

## 2015-05-10 LAB — CBC
HEMATOCRIT: 27.1 % — AB (ref 35.0–47.0)
HEMOGLOBIN: 8.7 g/dL — AB (ref 12.0–16.0)
MCH: 29.5 pg (ref 26.0–34.0)
MCHC: 32.3 g/dL (ref 32.0–36.0)
MCV: 91.5 fL (ref 80.0–100.0)
Platelets: 237 10*3/uL (ref 150–440)
RBC: 2.96 MIL/uL — AB (ref 3.80–5.20)
RDW: 19.9 % — ABNORMAL HIGH (ref 11.5–14.5)
WBC: 14 10*3/uL — ABNORMAL HIGH (ref 3.6–11.0)

## 2015-05-10 LAB — RENAL FUNCTION PANEL
ANION GAP: 8 (ref 5–15)
Albumin: 2.5 g/dL — ABNORMAL LOW (ref 3.5–5.0)
BUN: 48 mg/dL — ABNORMAL HIGH (ref 6–20)
CALCIUM: 8.4 mg/dL — AB (ref 8.9–10.3)
CHLORIDE: 106 mmol/L (ref 101–111)
CO2: 23 mmol/L (ref 22–32)
Creatinine, Ser: 5.7 mg/dL — ABNORMAL HIGH (ref 0.44–1.00)
GFR calc non Af Amer: 7 mL/min — ABNORMAL LOW (ref >60)
GFR, EST AFRICAN AMERICAN: 8 mL/min — AB (ref >60)
GLUCOSE: 110 mg/dL — AB (ref 65–99)
POTASSIUM: 3.6 mmol/L (ref 3.5–5.1)
Phosphorus: 6 mg/dL — ABNORMAL HIGH (ref 2.5–4.6)
SODIUM: 137 mmol/L (ref 135–145)

## 2015-05-10 LAB — TROPONIN I: Troponin I: 0.04 ng/mL — ABNORMAL HIGH (ref <0.031)

## 2015-05-10 LAB — VANCOMYCIN, RANDOM: VANCOMYCIN RM: 15 ug/mL

## 2015-05-10 LAB — CKMB (ARMC ONLY): CK, MB: 2 ng/mL (ref 0.5–5.0)

## 2015-05-10 MED ORDER — VANCOMYCIN HCL IN DEXTROSE 750-5 MG/150ML-% IV SOLN
750.0000 mg | Freq: Once | INTRAVENOUS | Status: AC
  Administered 2015-05-10: 750 mg via INTRAVENOUS
  Filled 2015-05-10 (×2): qty 150

## 2015-05-10 MED ORDER — IPRATROPIUM-ALBUTEROL 0.5-2.5 (3) MG/3ML IN SOLN
3.0000 mL | Freq: Four times a day (QID) | RESPIRATORY_TRACT | Status: DC
  Administered 2015-05-10 – 2015-05-11 (×2): 3 mL via RESPIRATORY_TRACT
  Filled 2015-05-10: qty 3

## 2015-05-10 NOTE — Progress Notes (Addendum)
Pharmacy Antibiotic Follow-up Note  Jean Tucker is a 64 y.o. year-old female admitted on 05/18/2015.  The patient is currently on day 3 of Zosyn, Levaquin,Vancomycin for HCAP.  Assessment/Plan: This patient's current antibiotics will be continued without adjustments.  Goal Vancomycin Pre-HD level= 15-25 mcg/ml  12/18: 63 y/o F with ARF and HCAP. Temporary Dialysis catheter placed. Patient had HD on 12/16, no HD 12/17, no HD 12/18. Will check a random Vancomycin level today to assess for re-dosing. Continue Levaquin 500mg  IV Q48h Continue Zosyn 3.375gm EI q12h  Addendum 12/18: Random Vancomycin level= 15 mcg/ml (Lab ran from blood drawn early this am 12/18 at 0159).   Will redose Vancomycin 750mg  IV x 1. Continue to follow for HD dosing vs random level assessments.  Temp (24hrs), Avg:98.3 F (36.8 C), Min:98.2 F (36.8 C), Max:98.3 F (36.8 C)   Recent Labs Lab 04/27/2015 0218 05/09/15 0630 05/10/15 0158  WBC 15.3* 13.2* 14.0*    Recent Labs Lab 04/23/2015 0218 05/09/15 0630 05/10/15 0158  CREATININE 6.95* 5.91* 5.70*   Estimated Creatinine Clearance: 10.9 mL/min (by C-G formula based on Cr of 5.7).    Allergies  Allergen Reactions  . Codeine Anaphylaxis  . Arava [Leflunomide] Other (See Comments)    Pt states that this medication causes liver damage.   . Atorvastatin Other (See Comments)    Pt states that this medication causes liver damage.   . Humira [Adalimumab] Other (See Comments)    Pt states that this medication causes liver damage.   . Methotrexate Derivatives Other (See Comments)    Pt states that this medication causes liver damage.   . Simvastatin Other (See Comments)    Pt states that this medication causes liver damage and joint pain.     Antimicrobials this admission: Zosyn      12/16 >> Levaquin  12/16 >>   Vancomycin 12/16>>  Levels/dose changes this admission:  Random Vancomycin level= 15 mcg/ml (off blood from early this am 12/18 at  0159).  Microbiology results: 12/16 BCx: NGx 2 days 12/16 UCx: 50,000 yeast  12/16 Sputum: ?needs to be collected   MRSA PCR:  Thank you for allowing pharmacy to be a part of this patient's care.  Tiare Rohlman A PharmD 05/10/2015 10:02 AM

## 2015-05-10 NOTE — Progress Notes (Signed)
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 Patient signed out AMA from [facility] yesterday. Patient noted to have acute renal failure developed after TIPS procedure. Also has pneumonia. At this time patient is admitted for healthcare associated pneumonia, acute renal failure.   CHIEF COMPLAINT:   Chief Complaint  Patient presents with  . Shortness of Breath   Still fairly short of breath. Denies pain. Has moved out of the ICU to the floor.  REVIEW OF SYSTEMS:   Review of Systems  Constitutional: Positive for malaise/fatigue. Negative for fever.  Respiratory: Positive for shortness of breath and wheezing.   Cardiovascular: Positive for chest pain and palpitations.  Gastrointestinal: Negative for nausea, vomiting and abdominal pain.  Genitourinary: Negative for dysuria.    DRUG ALLERGIES:   Allergies  Allergen Reactions  . Codeine Anaphylaxis  . Arava [Leflunomide] Other (See Comments)    Pt states that this medication causes liver damage.   . Atorvastatin Other (See Comments)    Pt states that this medication causes liver damage.   . Humira [Adalimumab] Other (See Comments)    Pt states that this medication causes liver damage.   . Methotrexate Derivatives Other (See Comments)    Pt states that this medication causes liver damage.   . Simvastatin Other (See Comments)    Pt states that this medication causes liver damage and joint pain.     VITALS:  Blood pressure 127/62, pulse 69, temperature 97.8 F (36.6 C), temperature source Oral, resp. rate 20, height 5\' 2"  (1.575 m), weight 93.396 kg (205 lb 14.4 oz), SpO2 99 %.  PHYSICAL EXAMINATION:  GENERAL:  64  y.o.-year-old patient lying in the bed, uncomfortable, pale LUNGS: Short shallow respirations, upper airway wheezes, no rhonchi or rails, mild respiratory distress CARDIOVASCULAR: S1, S2 normal. No murmurs, rubs, or gallops.  ABDOMEN: Soft, nontender, nondistended. Bowel sounds present. No organomegaly or mass.  EXTREMITIES: No cyanosis, or clubbing. 2+ bilateral pedal edema NEUROLOGIC: Cranial nerves II through XII are intact. Muscle strength 5/5 in all extremities. Sensation intact. Gait not checked.  PSYCHIATRIC: The patient is alert and oriented x 3. Anxious SKIN: No obvious rash, lesion, or ulcer.    LABORATORY PANEL:   CBC  Recent Labs Lab 05/10/15 0158  WBC 14.0*  HGB 8.7*  HCT 27.1*  PLT 237   ------------------------------------------------------------------------------------------------------------------  Chemistries   Recent Labs Lab 05/10/15 0158  NA 137  K 3.6  CL 106  CO2 23  GLUCOSE 110*  BUN 48*  CREATININE 5.70*  CALCIUM 8.4*   ------------------------------------------------------------------------------------------------------------------  Cardiac Enzymes  Recent Labs Lab 05/10/15 0158  TROPONINI 0.04*   ------------------------------------------------------------------------------------------------------------------  RADIOLOGY:  Dg Chest Port 1 View  05/09/2015  CLINICAL DATA:  Respiratory distress EXAM: PORTABLE CHEST - 1 VIEW COMPARISON:  05/13/2015 FINDINGS: Cardiac shadow is stable. Patchy upper lobe infiltrates are again seen with some mild improvement when compare with the prior exam. Overall improved aeration is noted. IMPRESSION: Persistent but somewhat improved upper lobe infiltrates bilaterally. Electronically Signed   By: Inez Catalina M.D.   On: 05/09/2015 11:23    EKG:   Orders placed or performed during the hospital encounter of 05/05/2015  . EKG  12-Lead  . EKG 12-Lead  . ED EKG  . ED EKG  . EKG 12-Lead  . EKG 12-Lead     ASSESSMENT AND PLAN:    1. Healthcare associated pneumonia:  - Cultured data is negative, chest x-ray with possible bilateral pneumonia - Continue broad-spectrum antibiotics including Levaquin, Zosyn, vancomycin  #2. 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 Patient had a history of rheumatoid arthritis with autoimmune hepatitis: Recent history of having a variceal bleed had to be transferred from [facility] ICU to [facility] on December 2 and the patient had a TIPS procedure on December 3. Postop course was complicated by hepatic encephalopathy, NSTMI, pulmonary edema, acute kidney injury . #4 slight confusion; resolved: Continue rifaximin, lactulose and check ammonia level.  #5. Hypothyroidism continue Synthroid.  6 history of rheumatoid arthritis: Intolerant to methotrexate, Humira,ARava  7. Atrial fibrillation: - Controlled  CODE STATUS ; full   All the records are reviewed and case discussed with Care Management/Social Workerr. Management plans discussed with the patient, family and they are in agreement.  CODE STATUS: full  TOTAL TIME TAKING CARE OF THIS PATIENT 35 minutes. CCT  POSSIBLE D/C IN 1-2 DAYS, DEPENDING ON CLINICAL CONDITION.   Myrtis Ser M.D on 05/10/2015 at 3:13 PM  Between 7am to 6pm - Pager - [phone]  After 6pm go to [email or web address] - password [email or web address] [facility]  Office  [phone]  CC: Primary care physician; Loura Pardon, MD   Note: This dictation was prepared with Dragon dictation along with smaller phrase technology. Any transcriptional errors that result from this process are unintentional.

## 2015-05-10 NOTE — Progress Notes (Addendum)
PULMONARY / CRITICAL CARE MEDICINE   Name: Jean Tucker MRN: [id number] DOB: 09-Nov-1950    ADMISSION DATE:  05/12/2015  BRIEF HISTORY: Patient a past medical history of GERD, hyperlipidemia, osteoporosis, rheumatoid arthritis, REB and liver cirrhosis status, residual varices status post recent TIPS procedure at [place] on 05/07/2015, presented to [facility] on 05/18/2015 with shortness of breath, altered mental status, acute renal failure, requiring urgent dialysis.  SUBJECTIVE:  [place] this morning, no significant respiratory distress this morning. Intermittent episodes of atrial fibrillation, currently in normal sinus rhythm. Patient sitting in chair today  STUDIES:  04/23/15 - recent admission to [facility] for PNA, ARF, Cirrhosis, transferred to [facility] for TIPS 05/06/14 - left [facility] AMA 04/24/15- TIPS at [facility] 05/14/2015 - represented to [facility] with SOB and AMS with ARF requiring urgent dialysis 05/09/15 - mentation back to baseline  VITAL SIGNS: Temp:  [98.2 F (36.8 C)-98.3 F (36.8 C)] 98.3 F (36.8 C) (12/17 1600) Pulse Rate:  [66-129] 77 (12/18 0700) Resp:  [18-32] 25 (12/18 0700) BP: (99-143)/(57-105) 137/81 mmHg (12/18 0700) SpO2:  [94 %-98 %] 97 % (12/18 0754) FiO2 (%):  [32 %] 32 % (12/18 0754) HEMODYNAMICS:   VENTILATOR SETTINGS: Vent Mode:  [-]  FiO2 (%):  [32 %] 32 % INTAKE / OUTPUT:  Intake/Output Summary (Last 24 hours) at 05/10/15 0950 Last data filed at 05/10/15 0000  Gross per 24 hour  Intake    443 ml  Output   1100 ml  Net   -657 ml    Review of Systems  Constitutional: Negative for fever, chills, weight loss and malaise/fatigue.  HENT: Negative for hearing loss.   Eyes: Negative for blurred vision.  Respiratory: Negative for cough, hemoptysis, sputum production, shortness of breath and wheezing.   Cardiovascular: Negative for chest pain, palpitations, orthopnea and leg swelling.  Gastrointestinal: Negative for heartburn and nausea.   Neurological: Negative for headaches.  Endo/Heme/Allergies: Does not bruise/bleed easily.  Psychiatric/Behavioral: Negative for depression. The patient is not nervous/anxious.     Physical Exam  Constitutional: She is oriented to person, place, and time and well-developed, well-nourished, and in no distress.  HENT:  Head: Normocephalic and atraumatic.  Right Ear: External ear normal.  Left Ear: External ear normal.  Nose: Nose normal.  Mouth/Throat: Oropharynx is clear and moist. No oropharyngeal exudate.  Eyes: Pupils are equal, round, and reactive to light.  Neck: Normal range of motion. Neck supple. No JVD present. No tracheal deviation present. No thyromegaly present.  Cardiovascular: Normal rate, regular rhythm and normal heart sounds.  Exam reveals no friction rub.   No murmur heard. Pulmonary/Chest: No stridor. She has no wheezes. She has no rales. She exhibits no tenderness.  Good breath sounds and respiratory effort, noted to be in mild tachypnea, not using any accessory muscles.  Musculoskeletal: Normal range of motion. She exhibits no edema.  Neurological: She is alert and oriented to person, place, and time.  Skin: Skin is warm and dry. There is erythema.  Psychiatric: Memory, affect and judgment normal.  Nursing note and vitals reviewed.    LABS:  CBC  Recent Labs Lab 05/15/2015 0218 05/09/15 0630 05/10/15 0158  WBC 15.3* 13.2* 14.0*  HGB 8.7* 7.8* 8.7*  HCT 27.7* 24.9* 27.1*  PLT 318 212 237   Coag's  Recent Labs Lab 04/25/2015 0218  INR 1.26   BMET  Recent Labs Lab 05/20/2015 0218 05/09/15 0630 05/10/15 0158  NA 135 140 137  K 3.6 4.2 3.6  CL  103 105 106  CO2 21* 25 23  BUN 54* 48* 48*  CREATININE 6.95* 5.91* 5.70*  GLUCOSE 185* 131* 110*   Electrolytes  Recent Labs Lab 05/15/2015 0218 05/09/15 0630 05/10/15 0158  CALCIUM 8.7* 8.7* 8.4*  PHOS  --   --  6.0*   Sepsis Markers  Recent Labs Lab 05/20/2015 0218 05/19/2015 0539   LATICACIDVEN 2.8* 2.5*   ABG  Recent Labs Lab 05/23/2015 0950  PHART 7.35  PCO2ART 39  PO2ART 68*   Liver Enzymes  Recent Labs Lab 05/10/15 0158  ALBUMIN 2.5*   Cardiac Enzymes  Recent Labs Lab 04/24/2015 0218 05/09/15 1300 05/10/15 0158  TROPONINI 0.06* 0.03 0.04*   Glucose  Recent Labs Lab 04/30/2015 0953  GLUCAP 152*    Imaging Dg Chest Port 1 View  05/09/2015  CLINICAL DATA:  Respiratory distress EXAM: PORTABLE CHEST - 1 VIEW COMPARISON:  05/10/2015 FINDINGS: Cardiac shadow is stable. Patchy upper lobe infiltrates are again seen with some mild improvement when compare with the prior exam. Overall improved aeration is noted. IMPRESSION: Persistent but somewhat improved upper lobe infiltrates bilaterally. Electronically Signed   By: Inez Catalina M.D.   On: 05/09/2015 11:23    Cultures: BCx2 12/16>> UC  Sputum  Antibiotics: Vanc 12/16>> Levaquin 12/16>>  Lines: 12/16 R Fem Vascath>>  ASSESSMENT / PLAN:  64 year old female past medical history of hyperlipidemia, autoimmune liver cirrhosis, recent TIPS, with acute renal failure requiring urgent dialysis, now with improving status  PULMONARY Dyspnea, mild respiratory distress Suspected PNA - HCAP ?COPD  P:  - Secondary to possible acute renal failure, mild fluid overload -Continue with supplemental oxygen, maintaining O2 saturations greater 90% -Monitor ins and outs -Anticipate improvement after dialysis -Bronchodilators as needed -Currently being treated as HCAP with Levaquin and vancomycin, I reviewed her chest x-ray findings, they are more consistent with pulmonary vascular congestion versus bilateral basilar pneumonia. -Patient had an episode of sustained atrial fibrillation this morning, being monitored -Prior to hospitalization showed prolonged smoking history, dyspneic with climbing 1 flight of stairs, questionable clinical COPD, will start on Dulera  CARDIOVASCULAR CVL - R Fem  Vascath Paroxysmal Atrial Fibrillation - new onset P:  -Monitor hemodynamics -12-lead EKG shows atrial fibrillation, heart rate less than 1:30 -We'll monitor atrial fibrillation for now, we'll tolerate heart rate less than 1:30 along with stable hemodynamics -Check cardiac enzymes and chest x-ray again -Atrial fibrillation most likely secondary to underlying renal failure and possible HCAP - cont with HD and antibiotics - may give dose of cardizem is afib persist with hemodynamic instability - BNP 12/16>>1986  RENAL Acute renal failure Anemia Necessity for Vas-Cath placement ?Acute hepatorenal syndrome P:  -Patient with normal kidney function after about 2 weeks ago, now with creatinine improving after urgent dialysis -Nephrology following, appreciate recommendations - no further HD sessions required at this time -Continue monitoring ins and outs -Avoid any nephrotoxic drugs -Monitor CBC  GASTROINTESTINAL 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   ENDOCRINE ICU hypo/hypglycemic  protocol  NEUROLOGIC AMS, consfusion-resolving, back to baseline mentation P:  RASS goal: 0 Secondary to ARF - cont with dialysis   Patient making great improvement in overall clinical status, nephro is not planning to perform any further HD session at this time, they will evaluate daily. Medically stable, can transfer to medical  floor.   I have personally obtained a history, examined the patient, evaluated laboratory and imaging results, formulated the assessment and plan and placed orders.  Thank you for consulting [facility], we will signoff at this time.  Please feel free to contact us with any questions at [phone] (please enter 7-digits).   Pulmonary Consult time - 72mins  Vilinda Boehringer, MD  Pulmonary and Critical Care Pager [phone] (please enter 7-digits) On Call Pager - [phone] (please enter 7-digits)   Note: This note was prepared with Dragon dictation along with smaller phrase technology. Any transcriptional errors that result from this process are unintentional.

## 2015-05-10 NOTE — Progress Notes (Signed)
[facility]  ROUNDING NOTE   Subjective:   Hemodialysis on 12/16 UOP 1100 (1500)  Creatinine 5.7 (5.91) BUn 48  Husband at bedside Patient seated in chair  Objective:  Vital signs in last 24 hours:  Temp:  [98.2 F (36.8 C)-98.3 F (36.8 C)] 98.3 F (36.8 C) (12/17 1600) Pulse Rate:  [66-129] 77 (12/18 0700) Resp:  [18-32] 25 (12/18 0700) BP: (86-143)/(49-105) 137/81 mmHg (12/18 0700) SpO2:  [92 %-98 %] 97 % (12/18 0754) FiO2 (%):  [32 %] 32 % (12/18 0754)  Weight change:  Filed Weights   05/04/2015 1530 05/20/2015 1700 05/09/15 0459  Weight: 96.1 kg (211 lb 13.8 oz) 95.2 kg (209 lb 14.1 oz) 93.9 kg (207 lb 0.2 oz)    Intake/Output: I/O last 3 completed shifts: In: 493 [P.O.:440; I.V.:3; IV Piggyback:50] Out: 1400 [Urine:1400]   Intake/Output this shift:     Physical Exam: General: NAD  Head: Right ear with clean tympanic membrane. Moist oral mucosal membranes  Eyes: Anicteric, PERRL  Neck: Supple, trachea midline  Lungs:  Clear to auscultation  Heart: Regular rate and rhythm  Abdomen:  Soft, nontender  Extremities: Trace dependent and peripheral edema.  Neurologic: Nonfocal, moving all four extremities  Skin: No lesions  Access: Right femoral vascath 12/16 Dr. Stevenson Clinch    Basic Metabolic Panel:  Recent Labs Lab 05/14/2015 0218 05/09/15 0630 05/10/15 0158  NA 135 140 137  K 3.6 4.2 3.6  CL 103 105 106  CO2 21* 25 23  GLUCOSE 185* 131* 110*  BUN 54* 48* 48*  CREATININE 6.95* 5.91* 5.70*  CALCIUM 8.7* 8.7* 8.4*  PHOS  --   --  6.0*    Liver Function Tests:  Recent Labs Lab 05/10/15 0158  ALBUMIN 2.5*   No results for input(s): LIPASE, AMYLASE in the last 168 hours.  Recent Labs Lab 04/29/2015 0218  AMMONIA 26    CBC:  Recent Labs Lab 05/14/2015 0218 05/09/15 0630 05/10/15 0158  WBC 15.3* 13.2* 14.0*  HGB 8.7* 7.8* 8.7*  HCT 27.7* 24.9* 27.1*  MCV 92.0 92.1 91.5  PLT 318 212 237    Cardiac Enzymes:  Recent Labs Lab  05/14/2015 0218 05/09/15 1300 05/10/15 0158  CKMB  --  1.8 2.0  TROPONINI 0.06* 0.03 0.04*    BNP: Invalid input(s): POCBNP  CBG:  Recent Labs Lab 05/20/2015 0953  GLUCAP 152*    Microbiology: Results for orders placed or performed during the hospital encounter of 05/06/2015  Culture, blood (Routine X 2) w Reflex to ID Panel     Status: None (Preliminary result)   Collection Time: 05/09/2015  2:18 AM  Result Value Ref Range Status   Specimen Description BLOOD LEFT ASSIST CONTROL  Final   Special Requests BOTTLES DRAWN AEROBIC AND ANAEROBIC 5 CC  Final   Culture NO GROWTH 2 DAYS  Final   Report Status PENDING  Incomplete  Culture, blood (Routine X 2) w Reflex to ID Panel     Status: None (Preliminary result)   Collection Time: 05/07/2015  2:18 AM  Result Value Ref Range Status   Specimen Description BLOOD RIGHT ASSIST CONTROL  Final   Special Requests BOTTLES DRAWN AEROBIC AND ANAEROBIC 1 CC  Final   Culture NO GROWTH 2 DAYS  Final   Report Status PENDING  Incomplete  Urine culture     Status: None (Preliminary result)   Collection Time: 05/16/2015  6:05 AM  Result Value Ref Range Status   Specimen Description URINE, RANDOM  Final   Special Requests NONE  Final   Culture NO GROWTH < 24 HOURS  Final   Report Status PENDING  Incomplete    Coagulation Studies:  Recent Labs  05/22/2015 0218  LABPROT 15.9*  INR 1.26    Urinalysis:  Recent Labs  05/07/2015 0605  COLORURINE YELLOW*  LABSPEC 1.010  PHURINE 5.0  GLUCOSEU NEGATIVE  HGBUR 1+*  BILIRUBINUR NEGATIVE  KETONESUR NEGATIVE  PROTEINUR NEGATIVE  NITRITE NEGATIVE  LEUKOCYTESUR TRACE*      Imaging: US Renal  04/27/2015  CLINICAL DATA:  Acute renal failure EXAM: RENAL / URINARY TRACT ULTRASOUND COMPLETE COMPARISON:  CT 08/28/2014 FINDINGS: Right Kidney: Length: 12.7 cm. Echogenicity within normal limits. No mass or hydronephrosis visualized. Left Kidney: Length: 14.0 cm. Echogenicity within normal limits. No mass  or hydronephrosis visualized. Bladder: Foley catheter in place, decompressed and not well visualized. IMPRESSION: No acute findings. Electronically Signed   By: Rolm Baptise M.D.   On: 05/17/2015 11:39   Dg Chest Port 1 View  05/09/2015  CLINICAL DATA:  Respiratory distress EXAM: PORTABLE CHEST - 1 VIEW COMPARISON:  05/06/2015 FINDINGS: Cardiac shadow is stable. Patchy upper lobe infiltrates are again seen with some mild improvement when compare with the prior exam. Overall improved aeration is noted. IMPRESSION: Persistent but somewhat improved upper lobe infiltrates bilaterally. Electronically Signed   By: Inez Catalina M.D.   On: 05/09/2015 11:23     Medications:     . antiseptic oral rinse  7 mL Mouth Rinse BID  . feeding supplement (ENSURE ENLIVE)  237 mL Oral BID BM  . fentaNYL (SUBLIMAZE) injection  100 mcg Intravenous Once  . heparin subcutaneous  5,000 Units Subcutaneous Q12H  . ipratropium-albuterol  3 mL Nebulization Q4H  . lactulose  20 g Oral Daily  . levofloxacin (LEVAQUIN) IV  500 mg Intravenous Q48H  . levothyroxine  100 mcg Oral QAC breakfast  . metoprolol tartrate  12.5 mg Oral BID  . mometasone-formoterol  2 puff Inhalation BID  . optichamber diamond  1 each Other Once  . pantoprazole  40 mg Oral Daily  . piperacillin-tazobactam (ZOSYN)  IV  3.375 g Intravenous Q12H  . rifaximin  550 mg Oral BID  . sevelamer carbonate  1,600 mg Oral TID WC  . sodium chloride  3 mL Intravenous Q12H   acetaminophen, ipratropium-albuterol, ondansetron **OR** ondansetron (ZOFRAN) IV, vancomycin, zolpidem  Assessment/ Plan:  Jean Tucker is a 64 y.o. 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  1. Acute renal failure: concern for acute hepatorenal syndrome versus ATN.  Hemodialysis 12/16 due to worsening renal function. Nonoliguric urine output. Baseline creatinine of 0.85 from 12/2 Electrolytes are at goal.  No acute indication for dialysis. Continue to monitor for dialysis need. Monitor volume status, urine output, electrolytes and renal function.  Creatinine continues to be critically elevated. Low threshold for dialysis.   2. 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  - rifaximin and lactulose for hepatic encephalopathy  3. Anemia unspecified. Hemoglobin 8.7 - consider epo   4. Pneumonia: leukocytosis improving. Afebrile.  - empiric zosyn, vanco and levofloxacin  5. Hypertension: blood pressure at goal - metoprolol  6. Secondary Hyperparathyroidism: phos 6. - currently on sevelamer.  - PTH pending   LOS: [place], [place] 12/18/20168:46 AM

## 2015-05-11 ENCOUNTER — Inpatient Hospital Stay: Payer: BLUE CROSS/BLUE SHIELD

## 2015-05-11 DIAGNOSIS — J9601 Acute respiratory failure with hypoxia: Secondary | ICD-10-CM

## 2015-05-11 LAB — BLOOD GAS, ARTERIAL
ACID-BASE EXCESS: 0.3 mmol/L (ref 0.0–3.0)
Allens test (pass/fail): POSITIVE — AB
BICARBONATE: 26.8 meq/L (ref 21.0–28.0)
DELIVERY SYSTEMS: POSITIVE
EXPIRATORY PAP: 5
FIO2: 0.4
Inspiratory PAP: 10
O2 SAT: 96.3 %
PATIENT TEMPERATURE: 37
PCO2 ART: 52 mmHg — AB (ref 32.0–48.0)
PH ART: 7.32 — AB (ref 7.350–7.450)
PO2 ART: 91 mmHg (ref 83.0–108.0)
RATE: 8 resp/min

## 2015-05-11 LAB — RENAL FUNCTION PANEL
ANION GAP: 9 (ref 5–15)
Albumin: 2.6 g/dL — ABNORMAL LOW (ref 3.5–5.0)
BUN: 47 mg/dL — AB (ref 6–20)
CHLORIDE: 102 mmol/L (ref 101–111)
CO2: 25 mmol/L (ref 22–32)
Calcium: 8.8 mg/dL — ABNORMAL LOW (ref 8.9–10.3)
Creatinine, Ser: 5.38 mg/dL — ABNORMAL HIGH (ref 0.44–1.00)
GFR calc Af Amer: 9 mL/min — ABNORMAL LOW (ref >60)
GFR, EST NON AFRICAN AMERICAN: 8 mL/min — AB (ref >60)
Glucose, Bld: 140 mg/dL — ABNORMAL HIGH (ref 65–99)
PHOSPHORUS: 6.5 mg/dL — AB (ref 2.5–4.6)
POTASSIUM: 3.8 mmol/L (ref 3.5–5.1)
Sodium: 136 mmol/L (ref 135–145)

## 2015-05-11 LAB — CBC
HEMATOCRIT: 27.4 % — AB (ref 35.0–47.0)
HEMOGLOBIN: 8.6 g/dL — AB (ref 12.0–16.0)
MCH: 29 pg (ref 26.0–34.0)
MCHC: 31.3 g/dL — ABNORMAL LOW (ref 32.0–36.0)
MCV: 92.5 fL (ref 80.0–100.0)
Platelets: 265 10*3/uL (ref 150–440)
RBC: 2.96 MIL/uL — ABNORMAL LOW (ref 3.80–5.20)
RDW: 20.9 % — AB (ref 11.5–14.5)
WBC: 14.4 10*3/uL — AB (ref 3.6–11.0)

## 2015-05-11 LAB — C4 COMPLEMENT: Complement C4, Body Fluid: 11 mg/dL — ABNORMAL LOW (ref 14–44)

## 2015-05-11 LAB — ENA+DNA/DS+ANTICH+CENTRO+JO...
CHROMATIN AB SERPL-ACNC: 0.3 AI (ref 0.0–0.9)
ENA SM Ab Ser-aCnc: <0.2 AI (ref 0.0–0.9)
Ribonucleic Protein: 8 AI — ABNORMAL HIGH (ref 0.0–0.9)
SSB (LA) (ENA) ANTIBODY, IGG: 0.3 AI (ref 0.0–0.9)
Scleroderma (Scl-70) (ENA) Antibody, IgG: <0.2 AI (ref 0.0–0.9)
ds DNA Ab: 1 IU/mL (ref 0–9)

## 2015-05-11 LAB — MPO/PR-3 (ANCA) ANTIBODIES
ANCA Proteinase 3: 5.7 U/mL — ABNORMAL HIGH (ref 0.0–3.5)
Myeloperoxidase Abs: <9 U/mL (ref 0.0–9.0)

## 2015-05-11 LAB — GLOMERULAR BASEMENT MEMBRANE ANTIBODIES: GBM Ab: 3 units (ref 0–20)

## 2015-05-11 LAB — URINE CULTURE

## 2015-05-11 LAB — ANA W/REFLEX IF POSITIVE: ANA: POSITIVE — AB

## 2015-05-11 LAB — GLUCOSE, CAPILLARY: Glucose-Capillary: 115 mg/dL — ABNORMAL HIGH (ref 65–99)

## 2015-05-11 LAB — PARATHYROID HORMONE, INTACT (NO CA): PTH: 40 pg/mL (ref 15–65)

## 2015-05-11 LAB — C3 COMPLEMENT: C3 COMPLEMENT: 94 mg/dL (ref 82–167)

## 2015-05-11 LAB — PHOSPHORUS: Phosphorus: 4.8 mg/dL — ABNORMAL HIGH (ref 2.5–4.6)

## 2015-05-11 MED ORDER — LIDOCAINE-PRILOCAINE 2.5-2.5 % EX CREA
1.0000 "application " | TOPICAL_CREAM | CUTANEOUS | Status: DC | PRN
  Filled 2015-05-11: qty 5

## 2015-05-11 MED ORDER — ALTEPLASE 2 MG IJ SOLR
2.0000 mg | Freq: Once | INTRAMUSCULAR | Status: DC | PRN
  Filled 2015-05-11: qty 2

## 2015-05-11 MED ORDER — SODIUM CHLORIDE 0.9 % IV SOLN: 100.0000 mL | INTRAVENOUS | Status: DC | PRN

## 2015-05-11 MED ORDER — ANTISEPTIC ORAL RINSE SOLUTION (CORINZ)
7.0000 mL | Freq: Four times a day (QID) | OROMUCOSAL | Status: DC
  Administered 2015-05-11 – 2015-05-14 (×8): 7 mL via OROMUCOSAL
  Filled 2015-05-11 (×11): qty 7

## 2015-05-11 MED ORDER — ZOLPIDEM TARTRATE 5 MG PO TABS
5.0000 mg | ORAL_TABLET | Freq: Every evening | ORAL | Status: DC | PRN
  Administered 2015-05-11: 5 mg via ORAL
  Filled 2015-05-11: qty 1

## 2015-05-11 MED ORDER — HEPARIN SODIUM (PORCINE) 1000 UNIT/ML DIALYSIS
1000.0000 [IU] | INTRAMUSCULAR | Status: DC | PRN
  Filled 2015-05-11: qty 1

## 2015-05-11 MED ORDER — METOPROLOL TARTRATE 1 MG/ML IV SOLN
2.5000 mg | INTRAVENOUS | Status: DC | PRN
  Filled 2015-05-11 (×2): qty 5

## 2015-05-11 MED ORDER — LIDOCAINE HCL (PF) 1 % IJ SOLN
5.0000 mL | INTRAMUSCULAR | Status: DC | PRN
  Filled 2015-05-11: qty 5

## 2015-05-11 MED ORDER — PENTAFLUOROPROP-TETRAFLUOROETH EX AERO
1.0000 "application " | INHALATION_SPRAY | CUTANEOUS | Status: DC | PRN
  Filled 2015-05-11: qty 30

## 2015-05-11 MED ORDER — IPRATROPIUM-ALBUTEROL 0.5-2.5 (3) MG/3ML IN SOLN: 3.0000 mL | RESPIRATORY_TRACT | Status: DC | PRN

## 2015-05-11 MED ORDER — CHLORHEXIDINE GLUCONATE 0.12% ORAL RINSE (MEDLINE KIT)
15.0000 mL | Freq: Two times a day (BID) | OROMUCOSAL | Status: DC
  Administered 2015-05-11 – 2015-05-13 (×4): 15 mL via OROMUCOSAL
  Filled 2015-05-11 (×9): qty 15

## 2015-05-11 MED ORDER — IPRATROPIUM-ALBUTEROL 0.5-2.5 (3) MG/3ML IN SOLN
3.0000 mL | Freq: Four times a day (QID) | RESPIRATORY_TRACT | Status: DC
  Administered 2015-05-11 – 2015-05-17 (×21): 3 mL via RESPIRATORY_TRACT
  Filled 2015-05-11 (×22): qty 3

## 2015-05-11 NOTE — Care Management (Addendum)
Patient was in icu 12/16-transferred out to [facility] then transferred back to icu on12/19 for respiratory difficulty requiring continuous bipap. Hoping to wean patient off bipapp  to nasal cannula today.  Patient has received hemodialysis 12/16 and  emergent hemo dialysis today.  At present, there is no documentation present to indicate patient has been diagnosed with esrd.

## 2015-05-11 NOTE — Progress Notes (Signed)
Dr Stevenson Clinch signed off yesterday as pt was transferred out of ICU Pt transferred back to ICU/SDU on BiPAP this AM .  She has subsequently undergone HD with 2 liters removed Now comfortable on [place] O2  Filed Vitals:   05/11/15 1159 05/11/15 1200 05/11/15 1215 05/11/15 1230  BP:  117/94 118/66 106/55  Pulse: 70 98 72 71  Temp:      TempSrc:      Resp: 22 19 19 20   Height:      Weight:   208 lb 5.4 oz (94.5 kg)   SpO2: 100% 99% 99% 99%   NAD JVP not visualized Bibasilar crackles RRR s M NABS, soft, NT No LE edema  BMP Latest Ref Rng 05/11/2015 05/10/2015 05/09/2015  Glucose 65 - 99 mg/dL 140(H) 110(H) 131(H)  BUN 6 - 20 mg/dL 47(H) 48(H) 48(H)  Creatinine 0.44 - 1.00 mg/dL 5.38(H) 5.70(H) 5.91(H)  Sodium 135 - 145 mmol/L 136 137 140  Potassium 3.5 - 5.1 mmol/L 3.8 3.6 4.2  Chloride 101 - 111 mmol/L 102 106 105  CO2 22 - 32 mmol/L 25 23 25   Calcium 8.9 - 10.3 mg/dL 8.8(L) 8.4(L) 8.7(L)    CBC Latest Ref Rng 05/11/2015 05/10/2015 05/09/2015  WBC 3.6 - 11.0 K/uL 14.4(H) 14.0(H) 13.2(H)  Hemoglobin 12.0 - 16.0 g/dL 8.6(L) 8.7(L) 7.8(L)  Hematocrit 35.0 - 47.0 % 27.4(L) 27.1(L) 24.9(L)  Platelets 150 - 440 K/uL 265 237 212    CXR: asymmetric edema pattern  IMPRESSION: 1) Acute hypoxic respiratory failure with bilateral pulmonary infiltrates - PNA vs edema. Although infiltrates are asymmetric, I favor edema based on clinical scenario and improvement with HD 2) PAF > NSR 3) AKI on CKD. Presently HD dependent 4) Autoimmune hepatitis/cirrhosis   PLAN/REC: Monitor in SDU today Check PCT and BNP in AM I have DC'd levofloxacin in absence of evidence of atypical PNA I have DC'd vancomycin in absence of evidence of resistant GPC Cont pip-tazo for now. Consider DC if PCT low or normal in AM Recheck CXR in AM 12/20  Merton Border, MD PCCM service Mobile [phone] Pager [phone]

## 2015-05-11 NOTE — Progress Notes (Addendum)
Pharmacy Antibiotic Follow-up Note  Jean Tucker is a 64 y.o. year-old female admitted on 05/20/2015.  The patient is currently on day 4 of Zosyn, Levaquin,Vancomycin for HCAP.  Assessment/Plan: This patient's current antibiotics will be continued without adjustments.  Goal Vancomycin Pre-HD level= 15-25 mcg/ml  Patient received HD today with vancomycin dose charted. Will continue current regimen of vancomycin 750 mg qhd. Will plan on checking a pre-HD level with the third full dialysis session. Continue Zosyn 3.375 g EI q 8 hours and Levaquin 500 mg iv q 48 hours. Will discuss plans for abx with CCM physician.   12/19 PM: Vancomycin and Levaquin D/C. Discussed duration of therpay for Zosyn on rounds with MD who will consider d/c Zosyn tomorrow based on PCT level.   Temp (24hrs), Avg:97.5 F (36.4 C), Min:97.4 F (36.3 C), Max:97.8 F (36.6 C)   Recent Labs Lab 05/17/2015 0218 05/09/15 0630 05/10/15 0158 05/11/15 0614  WBC 15.3* 13.2* 14.0* 14.4*     Recent Labs Lab 05/17/2015 0218 05/09/15 0630 05/10/15 0158 05/11/15 0614  CREATININE 6.95* 5.91* 5.70* 5.38*   Estimated Creatinine Clearance: 12.2 mL/min (by C-G formula based on Cr of 5.38).    Allergies  Allergen Reactions  . Codeine Anaphylaxis  . Arava [Leflunomide] Other (See Comments)    Pt states that this medication causes liver damage.   . Atorvastatin Other (See Comments)    Pt states that this medication causes liver damage.   . Humira [Adalimumab] Other (See Comments)    Pt states that this medication causes liver damage.   . Methotrexate Derivatives Other (See Comments)    Pt states that this medication causes liver damage.   . Simvastatin Other (See Comments)    Pt states that this medication causes liver damage and joint pain.     Antimicrobials this admission: Zosyn      12/16 >> Levaquin  12/16 >>   Vancomycin 12/16>>  Levels/dose changes this admission: 12/18: Random Vancomycin level= 15 mcg/ml    Microbiology results: 12/16 BCx: NGx 3 days 12/16 UCx: 50,000 yeast  12/16 Sputum: ?needs to be collected   Thank you for allowing pharmacy to be a part of this patient's care.  Ulice Dash D PharmD 05/11/2015 12:27 PM

## 2015-05-11 NOTE — Progress Notes (Signed)
Patient in respiratory distress. Recently had a SVN. Respiratory called to bedside and gave patient another SVN. Dr. Volanda Napoleon called for ABG, chest xray and BIPAP order. Dr. Volanda Napoleon also called for transfer order. Patient placed on BIPAP on 2C while waiting for bed assignment. Report was called to ICU and patient was transferred to unit while on BIPAP.

## 2015-05-11 NOTE — Progress Notes (Signed)
Called by floor to report increased work of breathing despite multiple breathing treatments. Have ordered ABG, CXR, Bipap transfer to SDU.  Now on Bipap and comfortable. RT reports wheezing and diminished air movement prior to BiPAP. Currently with no wheezing but decreased air movement on left. A-fib rate 110-120.  Have called nephrology and ICU.

## 2015-05-11 NOTE — Progress Notes (Signed)
[facility]  ROUNDING NOTE   Subjective:   Hemodialysis on 12/16 UOP 1700 (1100)  Creatinine 5.38 (5.7) (5.91) Critically ill Emergently transferred to ICU and placed on BiPAP for acute resp distress   Objective:  Vital signs in last 24 hours:  Temp:  [97.4 F (36.3 C)-97.8 F (36.6 C)] 97.4 F (36.3 C) (12/19 0926) Pulse Rate:  [66-106] 106 (12/19 0926) Resp:  [15-24] 15 (12/19 0926) BP: (107-168)/(53-104) 156/104 mmHg (12/19 0926) SpO2:  [96 %-99 %] 99 % (12/19 0926) FiO2 (%):  [32 %] 32 % (12/18 1212) Weight:  [92.715 kg (204 lb 6.4 oz)-96.5 kg (212 lb 11.9 oz)] 96.5 kg (212 lb 11.9 oz) (12/19 0926)  Weight change:  Filed Weights   05/10/15 1212 05/11/15 0500 05/11/15 0926  Weight: 93.396 kg (205 lb 14.4 oz) 92.715 kg (204 lb 6.4 oz) 96.5 kg (212 lb 11.9 oz)    Intake/Output: I/O last 3 completed shifts: In: 343 [P.O.:240; I.V.:3; IV Piggyback:100] Out: 2300 [Urine:2300]   Intake/Output this shift:     Physical Exam: General: NAD  HENT BiPAP mask on  Eyes: Anicteric,   Neck: Supple, trachea midline  Lungs:  NIPPV, tachypneic  Heart: irregular rate and rhythm, A Fib  Abdomen:  Soft, nontender, diatended  Extremities: 3+ dependent and peripheral edema.  Neurologic: Nonfocal, moving all four extremities  Skin: No lesions  Access: Right femoral vascath 12/16 Dr. Stevenson Clinch. Day #3    Basic Metabolic Panel:  Recent Labs Lab 05/15/2015 0218 05/09/15 0630 05/10/15 0158 05/11/15 0614  NA 135 140 137 136  K 3.6 4.2 3.6 3.8  CL 103 105 106 102  CO2 21* 25 23 25   GLUCOSE 185* 131* 110* 140*  BUN 54* 48* 48* 47*  CREATININE 6.95* 5.91* 5.70* 5.38*  CALCIUM 8.7* 8.7* 8.4* 8.8*  PHOS  --   --  6.0* 6.5*    Liver Function Tests:  Recent Labs Lab 05/10/15 0158 05/11/15 0614  ALBUMIN 2.5* 2.6*   No results for input(s): LIPASE, AMYLASE in the last 168 hours.  Recent Labs Lab 04/30/2015 0218  AMMONIA 26    CBC:  Recent Labs Lab  04/30/2015 0218 05/09/15 0630 05/10/15 0158 05/11/15 0614  WBC 15.3* 13.2* 14.0* 14.4*  HGB 8.7* 7.8* 8.7* 8.6*  HCT 27.7* 24.9* 27.1* 27.4*  MCV 92.0 92.1 91.5 92.5  PLT 318 212 237 265    Cardiac Enzymes:  Recent Labs Lab 05/10/2015 0218 05/09/15 1300 05/10/15 0158  CKMB  --  1.8 2.0  TROPONINI 0.06* 0.03 0.04*    BNP: Invalid input(s): POCBNP  CBG:  Recent Labs Lab 05/06/2015 0953 05/11/15 0903  GLUCAP 152* 115*    Microbiology: Results for orders placed or performed during the hospital encounter of 04/29/2015  Culture, blood (Routine X 2) w Reflex to ID Panel     Status: None (Preliminary result)   Collection Time: 05/15/2015  2:18 AM  Result Value Ref Range Status   Specimen Description BLOOD LEFT ASSIST CONTROL  Final   Special Requests BOTTLES DRAWN AEROBIC AND ANAEROBIC 5 CC  Final   Culture NO GROWTH 2 DAYS  Final   Report Status PENDING  Incomplete  Culture, blood (Routine X 2) w Reflex to ID Panel     Status: None (Preliminary result)   Collection Time: 05/07/2015  2:18 AM  Result Value Ref Range Status   Specimen Description BLOOD RIGHT ASSIST CONTROL  Final   Special Requests BOTTLES DRAWN AEROBIC AND ANAEROBIC 1 CC  Final   Culture NO GROWTH 2 DAYS  Final   Report Status PENDING  Incomplete  Urine culture     Status: None (Preliminary result)   Collection Time: 04/24/2015  6:05 AM  Result Value Ref Range Status   Specimen Description URINE, RANDOM  Final   Special Requests NONE  Final   Culture   Final    50,000 COLONIES/mL YEAST IDENTIFICATION TO FOLLOW    Report Status PENDING  Incomplete    Coagulation Studies: No results for input(s): LABPROT, INR in the last 72 hours.  Urinalysis: No results for input(s): COLORURINE, LABSPEC, PHURINE, GLUCOSEU, HGBUR, BILIRUBINUR, KETONESUR, PROTEINUR, UROBILINOGEN, NITRITE, LEUKOCYTESUR in the last 72 hours.  Invalid input(s): APPERANCEUR    Imaging: Dg Chest Port 1 View  05/11/2015  CLINICAL DATA:   Shortness of breath. EXAM: PORTABLE CHEST 1 VIEW COMPARISON:  05/09/2015. FINDINGS: Cardiomegaly with diffuse bilateral pulmonary alveolar infiltrates again noted. There is interim improvement from prior exam. Small left pleural effusion cannot be excluded. No pneumothorax. IMPRESSION: Cardiomegaly with persistent bilateral pulmonary alveolar infiltrates, predominantly in the upper lobes. Slight interim improvement from prior exam. Small left pleural effusion cannot be excluded. Findings consistent with improving congestive heart failure. Bilateral upper lobe pneumonia cannot be excluded . Electronically Signed   By: Cocke   On: 05/11/2015 08:56   Dg Chest Port 1 View  05/09/2015  CLINICAL DATA:  Respiratory distress EXAM: PORTABLE CHEST - 1 VIEW COMPARISON:  05/11/2015 FINDINGS: Cardiac shadow is stable. Patchy upper lobe infiltrates are again seen with some mild improvement when compare with the prior exam. Overall improved aeration is noted. IMPRESSION: Persistent but somewhat improved upper lobe infiltrates bilaterally. Electronically Signed   By: Inez Catalina M.D.   On: 05/09/2015 11:23     Medications:     . antiseptic oral rinse  7 mL Mouth Rinse BID  . feeding supplement (ENSURE ENLIVE)  237 mL Oral BID BM  . heparin subcutaneous  5,000 Units Subcutaneous Q12H  . ipratropium-albuterol  3 mL Nebulization QID  . lactulose  20 g Oral Daily  . levofloxacin (LEVAQUIN) IV  500 mg Intravenous Q48H  . levothyroxine  100 mcg Oral QAC breakfast  . metoprolol tartrate  12.5 mg Oral BID  . mometasone-formoterol  2 puff Inhalation BID  . optichamber diamond  1 each Other Once  . pantoprazole  40 mg Oral Daily  . piperacillin-tazobactam (ZOSYN)  IV  3.375 g Intravenous Q12H  . rifaximin  550 mg Oral BID  . sevelamer carbonate  1,600 mg Oral TID WC  . sodium chloride  3 mL Intravenous Q12H   acetaminophen, ipratropium-albuterol, ondansetron **OR** ondansetron (ZOFRAN) IV,  vancomycin  Assessment/ Plan:  Jean Tucker is a 64 y.o. 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  1. Acute renal failure: concern for acute hepatorenal syndrome versus ATN. Hemodialysis 12/16 due to worsening renal function. Nonoliguric urine output. Baseline creatinine of 0.85 from 12/2 Electrolytes are at goal.  Emergent HD this AM for volume removal  2. 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  - rifaximin and lactulose for hepatic encephalopathy  3.Anemia unspecified. Hemoglobin 8.6  4. Acute resp failure from Pneumonia and Acute pulmonary edema:  - empiric broad spectrum ABx - Emergent HD today. UF goal 2000  cc    LOS: 3 Becket Wecker 12/19/20169:31 AM

## 2015-05-11 NOTE — Progress Notes (Signed)
[place] [facility] [place] NAME: Jean Tucker    MR#:  [id number]  DATE OF BIRTH:  June 28, 1950  SUBJECTIVE: 29 64-year-old female patient with history of fall autoimmune off liver disease with cirrhosis with recent history of for variceal bleed status post a TIPS procedure at [facility]. Patient signed out AMA from [place] yesterday. Patient noted to have acute renal failure developed after TIPS procedure. Also has pneumonia. At this time patient is admitted for healthcare associated pneumonia, acute renal failure.   CHIEF COMPLAINT:   Chief Complaint  Patient presents with  . Shortness of Breath   Short of breath this morning, started BiPap and transfer to ICU for emergent HD.  Now more comfortable.  REVIEW OF SYSTEMS:   Review of Systems  Constitutional: Positive for malaise/fatigue. Negative for fever.  Respiratory: Positive for shortness of breath and wheezing.   Cardiovascular: Positive for chest pain and palpitations.  Gastrointestinal: Negative for nausea, vomiting and abdominal pain.  Genitourinary: Negative for dysuria.    DRUG ALLERGIES:   Allergies  Allergen Reactions  . Codeine Anaphylaxis  . Arava [Leflunomide] Other (See Comments)    Pt states that this medication causes liver damage.   . Atorvastatin Other (See Comments)    Pt states that this medication causes liver damage.   . Humira [Adalimumab] Other (See Comments)    Pt states that this medication causes liver damage.   . Methotrexate Derivatives Other (See Comments)    Pt states that this medication causes liver damage.   . Simvastatin Other (See Comments)    Pt states that this medication causes liver damage and joint pain.     VITALS:  Blood pressure 106/55, pulse 71, temperature 97.4 F (36.3 C), temperature source Axillary, resp. rate 20, height 5\' 4"  (1.626 m), weight 94.5 kg (208 lb 5.4 oz), SpO2 99 %.  PHYSICAL  EXAMINATION:  GENERAL:  64 y.o.-year-old patient lying in the bed, uncomfortable, pale LUNGS: Short shallow respirations, upper airway wheezes, no rhonchi or rails, mild respiratory distress, now on BiPap CARDIOVASCULAR: S1, S2 normal. No murmurs, rubs, or gallops. Tachycardic, irregular ABDOMEN: Soft, nontender, nondistended. Bowel sounds present. No organomegaly or mass.  EXTREMITIES: No cyanosis, or clubbing. 2+ bilateral pedal edema NEUROLOGIC: Cranial nerves II through XII are intact. Muscle strength 5/5 in all extremities. Sensation intact. Gait not checked.  PSYCHIATRIC: The patient is alert and oriented x 3. Anxious SKIN: No obvious rash, lesion, or ulcer.    LABORATORY PANEL:   CBC  Recent Labs Lab 05/11/15 0614  WBC 14.4*  HGB 8.6*  HCT 27.4*  PLT 265   ------------------------------------------------------------------------------------------------------------------  Chemistries   Recent Labs Lab 05/11/15 0614  NA 136  K 3.8  CL 102  CO2 25  GLUCOSE 140*  BUN 47*  CREATININE 5.38*  CALCIUM 8.8*   ------------------------------------------------------------------------------------------------------------------  Cardiac Enzymes  Recent Labs Lab 05/10/15 0158  TROPONINI 0.04*   ------------------------------------------------------------------------------------------------------------------  RADIOLOGY:  Dg Chest Port 1 View  05/11/2015  CLINICAL DATA:  Shortness of breath. EXAM: PORTABLE CHEST 1 VIEW COMPARISON:  05/09/2015. FINDINGS: Cardiomegaly with diffuse bilateral pulmonary alveolar infiltrates again noted. There is interim improvement from prior exam. Small left pleural effusion cannot be excluded. No pneumothorax. IMPRESSION: Cardiomegaly with persistent bilateral pulmonary alveolar infiltrates, predominantly in the upper lobes. Slight interim improvement from prior exam. Small left pleural effusion cannot be excluded. Findings consistent with  improving congestive heart failure. Bilateral upper lobe  pneumonia cannot be excluded . Electronically Signed   By: Marcello Moores  Register   On: 05/11/2015 08:56    EKG:   Orders placed or performed during the hospital encounter of 04/28/2015  . EKG 12-Lead  . EKG 12-Lead  . ED EKG  . ED EKG  . EKG 12-Lead  . EKG 12-Lead    ASSESSMENT AND PLAN:   * acute respiratory failure with hypoxia and distress - transfer to ICU, pulm consultation, HD to remove fluid, continue Bipap  1. Healthcare associated pneumonia:  - Cultured data is negative, chest x-ray with possible bilateral pneumonia - Continue broad-spectrum antibiotics including Levaquin, Zosyn, vancomycin  #2. Acute renal failure due to ATN  - Appreciate nephrology following - Received HD 12/17, is volume overloaded and in resp distress today, urgent HD with volume removal  #3 cirrhosis of the liver with a variceal bleed status post banding and tips. Patient had a history of rheumatoid arthritis with autoimmune hepatitis: Recent history of having a variceal bleed had to be transferred from [facility] ICU to [facility] on December 2 and the patient had a TIPS procedure on December 3. Postop course was complicated by hepatic encephalopathy, NSTMI, pulmonary edema, acute kidney injury . #4 slight confusion; resolved: Continue rifaximin, lactulose and check ammonia level.  #5. Hypothyroidism continue Synthroid.  6 history of rheumatoid arthritis: Intolerant to methotrexate, Humira,ARava  7. Atrial fibrillation: - Controlled  CODE STATUS ; full   All the records are reviewed and case discussed with Care Management/Social Workerr. Management plans discussed with the patient, family and they are in agreement.  CODE STATUS: full  TOTAL TIME TAKING CARE OF THIS PATIENT 35 minutes. CCT  POSSIBLE D/C IN 1-2 DAYS, DEPENDING ON CLINICAL CONDITION.   Myrtis Ser M.D on 05/11/2015 at 2:27 PM  Between 7am to 6pm  - Pager - [phone]  After 6pm go to [email or web address] - password [email or web address] [facility]  Office  [phone]  CC: Primary care physician; Loura Pardon, MD   Note: This dictation was prepared with Dragon dictation along with smaller phrase technology. Any transcriptional errors that result from this process are unintentional.

## 2015-05-11 NOTE — Progress Notes (Signed)
Post hd tx 

## 2015-05-11 NOTE — Progress Notes (Signed)
Hd tx start  

## 2015-05-11 NOTE — Progress Notes (Signed)
Pt is seen to have converted to sinus rhythm on the telemetry monitor. Pt was also able to sit in the bed in chair position this evening for 30 minutes and eat supper and is now able to speak clearly. Work of breathing continues to improve. Will continue to assess

## 2015-05-11 NOTE — Progress Notes (Signed)
Pre-hd tx 

## 2015-05-12 ENCOUNTER — Inpatient Hospital Stay: Payer: BLUE CROSS/BLUE SHIELD

## 2015-05-12 DIAGNOSIS — R918 Other nonspecific abnormal finding of lung field: Secondary | ICD-10-CM

## 2015-05-12 LAB — CBC
HCT: 28 % — ABNORMAL LOW (ref 35.0–47.0)
Hemoglobin: 8.9 g/dL — ABNORMAL LOW (ref 12.0–16.0)
MCH: 29.7 pg (ref 26.0–34.0)
MCHC: 31.8 g/dL — AB (ref 32.0–36.0)
MCV: 93.3 fL (ref 80.0–100.0)
PLATELETS: 205 10*3/uL (ref 150–440)
RBC: 3 MIL/uL — ABNORMAL LOW (ref 3.80–5.20)
RDW: 20.4 % — ABNORMAL HIGH (ref 11.5–14.5)
WBC: 12 10*3/uL — ABNORMAL HIGH (ref 3.6–11.0)

## 2015-05-12 LAB — BASIC METABOLIC PANEL
Anion gap: 7 (ref 5–15)
BUN: 31 mg/dL — AB (ref 6–20)
CALCIUM: 8.7 mg/dL — AB (ref 8.9–10.3)
CO2: 29 mmol/L (ref 22–32)
CREATININE: 3.78 mg/dL — AB (ref 0.44–1.00)
Chloride: 103 mmol/L (ref 101–111)
GFR calc Af Amer: 14 mL/min — ABNORMAL LOW (ref >60)
GFR calc non Af Amer: 12 mL/min — ABNORMAL LOW (ref >60)
GLUCOSE: 132 mg/dL — AB (ref 65–99)
Potassium: 4.2 mmol/L (ref 3.5–5.1)
Sodium: 139 mmol/L (ref 135–145)

## 2015-05-12 LAB — BRAIN NATRIURETIC PEPTIDE: B Natriuretic Peptide: 1170 pg/mL — ABNORMAL HIGH (ref 0.0–100.0)

## 2015-05-12 LAB — PROCALCITONIN: PROCALCITONIN: 0.63 ng/mL

## 2015-05-12 LAB — PARATHYROID HORMONE, INTACT (NO CA): PTH: 76 pg/mL — ABNORMAL HIGH (ref 15–65)

## 2015-05-12 MED ORDER — HALOPERIDOL LACTATE 5 MG/ML IJ SOLN
2.0000 mg | Freq: Once | INTRAMUSCULAR | Status: AC
  Administered 2015-05-12: 2 mg via INTRAVENOUS

## 2015-05-12 MED ORDER — LEVOTHYROXINE SODIUM 100 MCG PO TABS
100.0000 ug | ORAL_TABLET | Freq: Every day | ORAL | Status: DC
  Administered 2015-05-13 – 2015-05-15 (×3): 100 ug via ORAL
  Filled 2015-05-12: qty 1
  Filled 2015-05-12 (×2): qty 2
  Filled 2015-05-12: qty 1

## 2015-05-12 MED ORDER — HALOPERIDOL LACTATE 5 MG/ML IJ SOLN
INTRAMUSCULAR | Status: AC
  Administered 2015-05-12: 2 mg via INTRAVENOUS
  Filled 2015-05-12: qty 1

## 2015-05-12 MED ORDER — TRAZODONE HCL 50 MG PO TABS
50.0000 mg | ORAL_TABLET | Freq: Every evening | ORAL | Status: DC | PRN
  Administered 2015-05-12: 50 mg via ORAL
  Administered 2015-05-13: 25 mg via ORAL
  Administered 2015-05-14 – 2015-05-15 (×2): 50 mg via ORAL
  Filled 2015-05-12 (×4): qty 1

## 2015-05-12 MED ORDER — FUROSEMIDE 10 MG/ML IJ SOLN
2.0000 mg/h | INTRAVENOUS | Status: DC
  Administered 2015-05-12 – 2015-05-13 (×2): 4 mg/h via INTRAVENOUS
  Filled 2015-05-12 (×2): qty 25

## 2015-05-12 NOTE — Progress Notes (Signed)
PT Cancellation Note  Patient Details Name: Jean Tucker MRN: [id number] DOB: 15-Jun-1950   Cancelled Treatment:    Reason Eval/Treat Not Completed: Other (comment) (Consult received and chart reviewed.  Patient currently eating lunch; will re-attempt at later time/date as patient available.)   Sher Hellinger H. Owens Shark, PT, DPT, NCS 05/12/2015, 1:33 PM [phone]

## 2015-05-12 NOTE — Progress Notes (Signed)
Pharmacy Antibiotic Follow-up Note  Jean Tucker is a 64 y.o. year-old female admitted on 04/27/2015.  The patient is currently on day 5 of Zosyn.   Assessment/Plan: PCT level= 0.63.  Will continue Zosyn 3.375 g EI q 12 hours.   Temp (24hrs), Avg:98.3 F (36.8 C), Min:96.6 F (35.9 C), Max:99.1 F (37.3 C)   Recent Labs Lab 05/06/2015 0218 05/09/15 0630 05/10/15 0158 05/11/15 0614 05/12/15 0415  WBC 15.3* 13.2* 14.0* 14.4* 12.0*     Recent Labs Lab 05/03/2015 0218 05/09/15 0630 05/10/15 0158 05/11/15 0614 05/12/15 0415  CREATININE 6.95* 5.91* 5.70* 5.38* 3.78*   Estimated Creatinine Clearance: 17.1 mL/min (by C-G formula based on Cr of 3.78).    Allergies  Allergen Reactions  . Codeine Anaphylaxis  . Arava [Leflunomide] Other (See Comments)    Pt states that this medication causes liver damage.   . Atorvastatin Other (See Comments)    Pt states that this medication causes liver damage.   . Humira [Adalimumab] Other (See Comments)    Pt states that this medication causes liver damage.   . Methotrexate Derivatives Other (See Comments)    Pt states that this medication causes liver damage.   . Simvastatin Other (See Comments)    Pt states that this medication causes liver damage and joint pain.     Antimicrobials this admission: Zosyn      12/16 >> Levaquin  12/16 >>  12/19 Vancomycin 12/16>> 12/19  Levels/dose changes this admission: 12/18: Random Vancomycin level= 15 mcg/ml   Microbiology results: 12/16 BCx: NGx 4 days 12/16 UCx: 50,000 yeast  12/16 Sputum: ?needs to be collected   Thank you for allowing pharmacy to be a part of this patient's care.  Ulice Dash D PharmD 05/12/2015 2:44 PM

## 2015-05-12 NOTE — Progress Notes (Signed)
Nutrition Follow-up    INTERVENTION:   Meals and Snacks: Cater to patient preferences; recommend downgrading diet to Dysphagia III and liberalizing some of the restrictions, pt unable to eat raw/hard fruits and vegetables or tough meats.  Medical Food Supplement Therapy: will discontinue Ensure as pt does not like and is not drinking; pt did not want to try any other liquid supplements. Pt does like Ice cream so will send Magic Cup for pt to try  NUTRITION DIAGNOSIS:   Inadequate oral intake related to acute illness as evidenced by per patient/family report. Continues  GOAL:   Patient will meet greater than or equal to 90% of their needs  MONITOR:    (Energy intake, Electrolyte and renal profile)  REASON FOR ASSESSMENT:   Consult Poor PO  ASSESSMENT:    Pt off Bipap, confused during the night; starting lasix drip today, short HD treatment. Pt making urine, about 80 ml/hr this AM  Diet Order:  Renal Diet with Fluid restriction  Energy Intake: recorded po intake <15% of meals on average; pt ate 1/4 of hamburger at lunch today. Pt is not drinking Ensure as she does not like. Pt reports a lot of the foods she likes she is told she cannot have (potato soup, bananas, pudding, ice cream)  Electrolyte and Renal Profile:  Recent Labs Lab 05/10/15 0158 05/11/15 0614 05/11/15 1449 05/12/15 0415  BUN 48* 47*  --  31*  CREATININE 5.70* 5.38*  --  3.78*  NA 137 136  --  139  K 3.6 3.8  --  4.2  PHOS 6.0* 6.5* 4.8*  --    Glucose Profile:  Recent Labs  05/11/15 0903  GLUCAP 115*   Meds: lasix drip, lactulose, xifaxan  Height:   Ht Readings from Last 1 Encounters:  05/11/15 5\' 4"  (1.626 m)    Weight:   Wt Readings from Last 1 Encounters:  05/12/15 205 lb 11 oz (93.3 kg)    Filed Weights   05/11/15 0926 05/11/15 1215 05/12/15 0500  Weight: 212 lb 11.9 oz (96.5 kg) 208 lb 5.4 oz (94.5 kg) 205 lb 11 oz (93.3 kg)   BMI:  Body mass index is 35.29  kg/(m^2).  Estimated Nutritional Needs:   Kcal:  BEE 1013 kcals (IF 1.0-1.3, AF 1.3) 1316-1711 kcals/d.   Protein:  (1.0-1.2 g/kg) 50-60 g/d  Fluid:  (1042ml + urine output)  EDUCATION NEEDS:   No education needs identified at this time   [place], [place], LDN [phone] Pager  [phone] Weekend/On-Call Pager

## 2015-05-12 NOTE — Progress Notes (Signed)
Pt remains stable on 4L O2 nasal cannula. PT is resting comfortably. Report given to [facility]

## 2015-05-12 NOTE — Progress Notes (Signed)
PT Cancellation Note  Patient Details Name: SERAFINA EMMANUEL MRN: [id number] DOB: 12/27/1950   Cancelled Treatment:    Reason Eval/Treat Not Completed: Patient at procedure or test/unavailable (Evaluation re-attempted.  Patient currently preparing to start dialysis; unavailable for participation.  Will re-attempt next date as patient available and medically appropriate.)  Beena Catano H. Owens Shark, PT, DPT, NCS 05/12/2015, 3:09 PM [phone]

## 2015-05-12 NOTE — Progress Notes (Signed)
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 Patient signed out AMA from [place] yesterday. Patient noted to have acute renal failure developed after TIPS procedure. Also has pneumonia. At this time patient is admitted for healthcare associated pneumonia, acute renal failure.   CHIEF COMPLAINT:   Chief Complaint  Patient presents with  . Shortness of Breath   Still fairly short of breath. Holding onto bed rails, short shallow respirations  REVIEW OF SYSTEMS:   Review of Systems  Constitutional: Positive for malaise/fatigue. Negative for fever.  Respiratory: Positive for shortness of breath and wheezing.   Cardiovascular: Positive for chest pain and palpitations.  Gastrointestinal: Negative for nausea, vomiting and abdominal pain.  Genitourinary: Negative for dysuria.    DRUG ALLERGIES:   Allergies  Allergen Reactions  . Codeine Anaphylaxis  . Arava [Leflunomide] Other (See Comments)    Pt states that this medication causes liver damage.   . Atorvastatin Other (See Comments)    Pt states that this medication causes liver damage.   . Humira [Adalimumab] Other (See Comments)    Pt states that this medication causes liver damage.   . Methotrexate Derivatives Other (See Comments)    Pt states that this medication causes liver damage.   . Simvastatin Other (See Comments)    Pt states that this medication causes liver damage and joint pain.     VITALS:  Blood pressure 112/63, pulse 66, temperature 98.2 F (36.8 C), temperature source Oral, resp. rate 24, height 5\' 4"  (1.626 m), weight 93.3 kg (205 lb 11 oz), SpO2 100 %.  PHYSICAL EXAMINATION:  GENERAL:  64  y.o.-year-old patient sitting up in bed, uncomfortable, mild respiratory distress LUNGS: Short shallow respirations, upper airway wheezes, no rhonchi or rails, mild respiratory distress, on nasal cannula  CARDIOVASCULAR: S1, S2 normal. No murmurs, rubs, or gallops. Tachycardic, irregular ABDOMEN: Soft, nontender, nondistended. Bowel sounds present. No organomegaly or mass.  EXTREMITIES: No cyanosis, or clubbing. 2+ bilateral pedal edema NEUROLOGIC: Cranial nerves II through XII are intact. Muscle strength 5/5 in all extremities. Sensation intact. Gait not checked.  PSYCHIATRIC: The patient is alert and oriented x 3. Anxious SKIN: No obvious rash, lesion, or ulcer.    LABORATORY PANEL:   CBC  Recent Labs Lab 05/12/15 0415  WBC 12.0*  HGB 8.9*  HCT 28.0*  PLT 205   ------------------------------------------------------------------------------------------------------------------  Chemistries   Recent Labs Lab 05/12/15 0415  NA 139  K 4.2  CL 103  CO2 29  GLUCOSE 132*  BUN 31*  CREATININE 3.78*  CALCIUM 8.7*   ------------------------------------------------------------------------------------------------------------------  Cardiac Enzymes  Recent Labs Lab 05/10/15 0158  TROPONINI 0.04*   ------------------------------------------------------------------------------------------------------------------  RADIOLOGY:  Dg Chest Port 1 View  05/12/2015  CLINICAL DATA:  Acute hypoxic respiratory failure. Acute kidney injury superimposed on chronic renal disease. EXAM: PORTABLE CHEST 1 VIEW COMPARISON:  Single view of the chest 05/11/2015 and 05/09/2015. FINDINGS: Since yesterday's examination, there has been increase in a moderate right pleural effusion. Smaller left pleural effusion is unchanged. Asymmetric bilateral airspace disease persists without marked change. There is cardiomegaly. IMPRESSION: No marked change in asymmetric bilateral airspace disease which could be due  to edema and/or pneumonia. Increased  moderate pleural effusion. Small left pleural effusion is unchanged. Electronically Signed   By: Inge Rise M.D.   On: 05/12/2015 07:31   Dg Chest Port 1 View  05/11/2015  CLINICAL DATA:  Shortness of breath. EXAM: PORTABLE CHEST 1 VIEW COMPARISON:  05/09/2015. FINDINGS: Cardiomegaly with diffuse bilateral pulmonary alveolar infiltrates again noted. There is interim improvement from prior exam. Small left pleural effusion cannot be excluded. No pneumothorax. IMPRESSION: Cardiomegaly with persistent bilateral pulmonary alveolar infiltrates, predominantly in the upper lobes. Slight interim improvement from prior exam. Small left pleural effusion cannot be excluded. Findings consistent with improving congestive heart failure. Bilateral upper lobe pneumonia cannot be excluded . Electronically Signed   By: Marcello Moores  Register   On: 05/11/2015 08:56    EKG:   Orders placed or performed during the hospital encounter of 05/03/2015  . EKG 12-Lead  . EKG 12-Lead  . ED EKG  . ED EKG  . EKG 12-Lead  . EKG 12-Lead    ASSESSMENT AND PLAN:   * acute respiratory failure with hypoxia and distress: - Oxygenation has improved after 2 L removed with hemodialysis yesterday, still short of breath and slightly distressed. Continued nasal cannula for now. Continue with plan for hemodialysis today. - Appreciate pulmonology following  1. Healthcare associated pneumonia:  - Cultured data is negative, chest x-ray with possible bilateral pneumonia - Continue Zosyn  #2. Acute renal failure due to ATN  - Appreciate nephrology following - Received HD 12/17, 12/19 and will have again today - lasix drip started - Her and output improving creatinine improving  #3 cirrhosis of the liver with a variceal bleed status post banding and tips. Patient had a history of rheumatoid arthritis with autoimmune hepatitis: Recent history of having a variceal bleed had to be transferred from  [facility] ICU to [facility] on December 2 and the patient had a TIPS procedure on December 3. Postop course was complicated by hepatic encephalopathy, NSTMI, pulmonary edema, acute kidney injury - monitor hepatic function . #4 slight confusion; resolved: Continue rifaximin, lactulose and check ammonia level.  #5. Hypothyroidism continue Synthroid.  6 history of rheumatoid arthritis: Intolerant to methotrexate, Humira,ARava  7. Atrial fibrillation: - tachycardic, normal sinusto A. Fib alternating today. Continue to monitor  CODE STATUS ; full   All the records are reviewed and case discussed with Care Management/Social Workerr. Management plans discussed with the patient, family and they are in agreement.  CODE STATUS: full  TOTAL TIME TAKING CARE OF THIS PATIENT 35 minutes. CCT  POSSIBLE D/C IN 1-2 DAYS, DEPENDING ON CLINICAL CONDITION.   Myrtis Ser M.D on 05/12/2015 at 3:44 PM  Between 7am to 6pm - Pager - [phone]  After 6pm go to [email or web address] - password [email or web address] [facility]  Office  [phone]  CC: Primary care physician; Loura Pardon, MD   Note: This dictation was prepared with Dragon dictation along with smaller phrase technology. Any transcriptional errors that result from this process are unintentional.

## 2015-05-12 NOTE — Progress Notes (Addendum)
05/11/15 at 2245:  Pt back in afib 120s-130s, complaining of increased SOB with increased WOB and tachypnea noted  05/11/15 at 2350:  Pt increasingly anxious, wanting to sit up in chair.  Encouraged to try Bipap to help with breathing.  Patient ambulated to chair and placed on Bipap 40%.  05/12/15 at 0100:  Pt became restless and agitated, taking off Bipap mask, refusing to wear Bipap or nasal cannula.  Pt states she wants [place] to "just turn the monitor off" and leave her alone.  With encouragement, pt agreed to wear nasal cannula at 4L O2.  MD paged, Dr. Marcille Blanco informed of pt's increased agitation and refusal of treatment.  Orders received for haldol 2mg  once for agitation and anxiety.  Patient back to bed at this time and haldol given.  05/12/15 at 0145:  Pt converted back to NSR.  Resting comfortably.  O2 sats 98% on 4LNC.    05/12/15 at 0310:  Pt back in afib 110s.  Will continue to assess and monitor.

## 2015-05-12 NOTE — Progress Notes (Signed)
[facility]  ROUNDING NOTE   Subjective:   Hemodialysis on 12/16, 12/19 (2000 cc removed with HD) UOP 700 cc (1700) (1100) . At present, About 80 cc/hr this morning Creatinine (5.38) (5.7) (5.91) Critically ill Off of BiPAP She was confused last night. Thought to be secondary to Ambien Continues to have large amount of leg edema   Objective:  Vital signs in last 24 hours:  Temp:  [96.6 F (35.9 C)-99.1 F (37.3 C)] 96.6 F (35.9 C) (12/20 0800) Pulse Rate:  [65-132] 125 (12/20 0900) Resp:  [13-31] 27 (12/20 0900) BP: (87-145)/(48-94) 114/80 mmHg (12/20 0900) SpO2:  [95 %-100 %] 99 % (12/20 0910) FiO2 (%):  [40 %] 40 % (12/20 0000) Weight:  [93.3 kg (205 lb 11 oz)-94.5 kg (208 lb 5.4 oz)] 93.3 kg (205 lb 11 oz) (12/20 0500)  Weight change: 3.104 kg (6 lb 13.5 oz) Filed Weights   05/11/15 0926 05/11/15 1215 05/12/15 0500  Weight: 96.5 kg (212 lb 11.9 oz) 94.5 kg (208 lb 5.4 oz) 93.3 kg (205 lb 11 oz)    Intake/Output: I/O last 3 completed shifts: In: 1199 [P.O.:896; I.V.:3; IV Piggyback:300] Out: 3500 [Urine:1500; Other:2000]   Intake/Output this shift:  Total I/O In: 300 [P.O.:300] Out: -   Physical Exam: General: NAD  HENT [place] oxygen  Eyes: Anicteric,   Neck: Supple, trachea midline  Lungs:  Mild basilar crackles, no wheezing  Heart: irregular rate and rhythm, A Fib  Abdomen:  Soft, nontender, diatended  Extremities: 2-3+ dependent and peripheral edema.  Neurologic: Nonfocal, moving all four extremities  Skin: No lesions  Access: Right femoral vascath 12/16 Dr. Stevenson Clinch. Day #4  Foley  Basic Metabolic Panel:  Recent Labs Lab 05/14/2015 0218 05/09/15 0630 05/10/15 0158 05/11/15 0614 05/11/15 1449 05/12/15 0415  NA 135 140 137 136  --  139  K 3.6 4.2 3.6 3.8  --  4.2  CL 103 105 106 102  --  103  CO2 21* 25 23 25   --  29  GLUCOSE 185* 131* 110* 140*  --  132*  BUN 54* 48* 48* 47*  --  31*  CREATININE 6.95* 5.91* 5.70* 5.38*  --  3.78*   CALCIUM 8.7* 8.7* 8.4* 8.8*  --  8.7*  PHOS  --   --  6.0* 6.5* 4.8*  --     Liver Function Tests:  Recent Labs Lab 05/10/15 0158 05/11/15 0614  ALBUMIN 2.5* 2.6*   No results for input(s): LIPASE, AMYLASE in the last 168 hours.  Recent Labs Lab 05/09/2015 0218  AMMONIA 26    CBC:  Recent Labs Lab 05/16/2015 0218 05/09/15 0630 05/10/15 0158 05/11/15 0614 05/12/15 0415  WBC 15.3* 13.2* 14.0* 14.4* 12.0*  HGB 8.7* 7.8* 8.7* 8.6* 8.9*  HCT 27.7* 24.9* 27.1* 27.4* 28.0*  MCV 92.0 92.1 91.5 92.5 93.3  PLT 318 212 237 265 205    Cardiac Enzymes:  Recent Labs Lab 04/26/2015 0218 05/09/15 1300 05/10/15 0158  CKMB  --  1.8 2.0  TROPONINI 0.06* 0.03 0.04*    BNP: Invalid input(s): POCBNP  CBG:  Recent Labs Lab 05/11/2015 0953 05/11/15 0903  GLUCAP 152* 115*    Microbiology: Results for orders placed or performed during the hospital encounter of 05/20/2015  Culture, blood (Routine X 2) w Reflex to ID Panel     Status: None (Preliminary result)   Collection Time: 04/28/2015  2:18 AM  Result Value Ref Range Status   Specimen Description BLOOD LEFT ASSIST CONTROL  Final   Special Requests BOTTLES DRAWN AEROBIC AND ANAEROBIC 5 CC  Final   Culture NO GROWTH 3 DAYS  Final   Report Status PENDING  Incomplete  Culture, blood (Routine X 2) w Reflex to ID Panel     Status: None (Preliminary result)   Collection Time: 05/09/2015  2:18 AM  Result Value Ref Range Status   Specimen Description BLOOD RIGHT ASSIST CONTROL  Final   Special Requests BOTTLES DRAWN AEROBIC AND ANAEROBIC 1 CC  Final   Culture NO GROWTH 3 DAYS  Final   Report Status PENDING  Incomplete  Urine culture     Status: None   Collection Time: 04/27/2015  6:05 AM  Result Value Ref Range Status   Specimen Description URINE, RANDOM  Final   Special Requests NONE  Final   Culture 50,000 COLONIES/mL CANDIDA ALBICANS  Final   Report Status 05/11/2015 FINAL  Final    Coagulation Studies: No results for  input(s): LABPROT, INR in the last 72 hours.  Urinalysis: No results for input(s): COLORURINE, LABSPEC, PHURINE, GLUCOSEU, HGBUR, BILIRUBINUR, KETONESUR, PROTEINUR, UROBILINOGEN, NITRITE, LEUKOCYTESUR in the last 72 hours.  Invalid input(s): APPERANCEUR    Imaging: Dg Chest Port 1 View  05/12/2015  CLINICAL DATA:  Acute hypoxic respiratory failure. Acute kidney injury superimposed on chronic renal disease. EXAM: PORTABLE CHEST 1 VIEW COMPARISON:  Single view of the chest 05/11/2015 and 05/09/2015. FINDINGS: Since yesterday's examination, there has been increase in a moderate right pleural effusion. Smaller left pleural effusion is unchanged. Asymmetric bilateral airspace disease persists without marked change. There is cardiomegaly. IMPRESSION: No marked change in asymmetric bilateral airspace disease which could be due to edema and/or pneumonia. Increased moderate pleural effusion. Small left pleural effusion is unchanged. Electronically Signed   By: Inge Rise M.D.   On: 05/12/2015 07:31   Dg Chest Port 1 View  05/11/2015  CLINICAL DATA:  Shortness of breath. EXAM: PORTABLE CHEST 1 VIEW COMPARISON:  05/09/2015. FINDINGS: Cardiomegaly with diffuse bilateral pulmonary alveolar infiltrates again noted. There is interim improvement from prior exam. Small left pleural effusion cannot be excluded. No pneumothorax. IMPRESSION: Cardiomegaly with persistent bilateral pulmonary alveolar infiltrates, predominantly in the upper lobes. Slight interim improvement from prior exam. Small left pleural effusion cannot be excluded. Findings consistent with improving congestive heart failure. Bilateral upper lobe pneumonia cannot be excluded . Electronically Signed   By: Marcello Moores  Register   On: 05/11/2015 08:56     Medications:     . antiseptic oral rinse  7 mL Mouth Rinse BID  . antiseptic oral rinse  7 mL Mouth Rinse QID  . chlorhexidine gluconate  15 mL Mouth Rinse BID  . feeding supplement (ENSURE  ENLIVE)  237 mL Oral BID BM  . heparin subcutaneous  5,000 Units Subcutaneous Q12H  . ipratropium-albuterol  3 mL Nebulization Q6H  . lactulose  20 g Oral Daily  . levothyroxine  100 mcg Oral QAC breakfast  . metoprolol tartrate  12.5 mg Oral BID  . pantoprazole  40 mg Oral Daily  . piperacillin-tazobactam (ZOSYN)  IV  3.375 g Intravenous Q12H  . rifaximin  550 mg Oral BID  . sevelamer carbonate  1,600 mg Oral TID WC  . sodium chloride  3 mL Intravenous Q12H   sodium chloride, sodium chloride, acetaminophen, alteplase, heparin, ipratropium-albuterol, lidocaine (PF), lidocaine-prilocaine, metoprolol, ondansetron **OR** ondansetron (ZOFRAN) IV, pentafluoroprop-tetrafluoroeth, traZODone  Assessment/ Plan:  Jean Tucker is a 64 y.o. white female GERD, hyperlipidemia, osteoporosis, rheumatoid arthritis, cirrhosis  of the liver secondary to autoimmune process, esophageal varices status post TIPS procedure, who was admitted to [facility] on 05/04/2015 for evaluation of shortness of breath.  1. Acute renal failure: concern for acute hepatorenal syndrome versus ATN. Hemodialysis 12/16 due to worsening renal function. Nonoliguric urine output. Baseline creatinine of 0.85 from 12/2 2000 cc removed with HD yesterday UOP ~ 80 cc/hr Short treatment of HD today Also start lasix drip  2. Liver cirrhosis (autoimmune in etiology) with history of esophogeal varices s/p TIPS at [facility] 04/25/15.  - rifaximin and lactulose for hepatic encephalopathy  3.Anemia unspecified. Hemoglobin 8.9  4. Acute resp failure from Pneumonia and Acute pulmonary edema:  - empiric broad spectrum ABx - clinically better today after volume removal    LOS: 4 Shamikia Linskey 12/20/201610:22 AM

## 2015-05-12 NOTE — Progress Notes (Signed)
Pt has converted back in to Sinus Rhythm. Pt is on 4L O2 by nasal cannula and resting. Multiple attempts have been made to wean O2. Each time pt states she is having a difficult time breathing.

## 2015-05-12 NOTE — Progress Notes (Signed)
Dr Stevenson Clinch signed off 12/18 as pt was transferred out of ICU Pt transferred back to ICU/SDU on BiPAP 12/19.  Resp status improved after HD 12/19 Severe delirium after Ambien 12/19 PM  No respiratory distress this AM,. Has little recall of events last night. No new complaints  Filed Vitals:   05/12/15 1423 05/12/15 1500 05/12/15 1515 05/12/15 1546  BP:  112/63    Pulse:  66    Temp:   98.1 F (36.7 C)   TempSrc:   Oral   Resp:  24    Height:      Weight:      SpO2: 99% 100%  99%   NAD JVP not visualized Bibasilar crackles - improved RRR s M NABS, soft, NT No LE edema  BMP Latest Ref Rng 05/12/2015 05/11/2015 05/10/2015  Glucose 65 - 99 mg/dL 132(H) 140(H) 110(H)  BUN 6 - 20 mg/dL 31(H) 47(H) 48(H)  Creatinine 0.44 - 1.00 mg/dL 3.78(H) 5.38(H) 5.70(H)  Sodium 135 - 145 mmol/L 139 136 137  Potassium 3.5 - 5.1 mmol/L 4.2 3.8 3.6  Chloride 101 - 111 mmol/L 103 102 106  CO2 22 - 32 mmol/L 29 25 23   Calcium 8.9 - 10.3 mg/dL 8.7(L) 8.8(L) 8.4(L)    CBC Latest Ref Rng 05/12/2015 05/11/2015 05/10/2015  WBC 3.6 - 11.0 K/uL 12.0(H) 14.4(H) 14.0(H)  Hemoglobin 12.0 - 16.0 g/dL 8.9(L) 8.6(L) 8.7(L)  Hematocrit 35.0 - 47.0 % 28.0(L) 27.4(L) 27.1(L)  Platelets 150 - 440 K/uL 205 265 237   PCT 0.63 BNP 1170  CXR: [place]  IMPRESSION: 1) Acute hypoxic respiratory failure with bilateral pulmonary infiltrates - PNA and/or edema. Although infiltrates are asymmetric.  - BNP elevated suggesting edema component  Lasix ordered by Dr Candiss Norse - PCT is low suggesting resolving PNA or noninfectious process 2) PAF > NSR 3) AKI on CKD. Presently HD dependent 4) Autoimmune hepatitis/cirrhosis 5) Acute delirium 12/19 PM - likely ambien induced  PLAN/REC: Cont to monitor in SDU today Cont pip-tazo for total of 7 days - stop date ordered Recheck CXR in AM 12/21  Merton Border, MD PCCM service Mobile [phone] Pager [phone]

## 2015-05-13 ENCOUNTER — Inpatient Hospital Stay: Payer: BLUE CROSS/BLUE SHIELD

## 2015-05-13 DIAGNOSIS — E8770 Fluid overload, unspecified: Secondary | ICD-10-CM

## 2015-05-13 LAB — COMPREHENSIVE METABOLIC PANEL
ALBUMIN: 2.5 g/dL — AB (ref 3.5–5.0)
ALK PHOS: 55 U/L (ref 38–126)
ALT: 13 U/L — AB (ref 14–54)
AST: 24 U/L (ref 15–41)
Anion gap: 7 (ref 5–15)
BILIRUBIN TOTAL: 1.2 mg/dL (ref 0.3–1.2)
BUN: 28 mg/dL — AB (ref 6–20)
CO2: 31 mmol/L (ref 22–32)
CREATININE: 3.36 mg/dL — AB (ref 0.44–1.00)
Calcium: 8.6 mg/dL — ABNORMAL LOW (ref 8.9–10.3)
Chloride: 100 mmol/L — ABNORMAL LOW (ref 101–111)
GFR calc Af Amer: 16 mL/min — ABNORMAL LOW (ref >60)
GFR, EST NON AFRICAN AMERICAN: 14 mL/min — AB (ref >60)
GLUCOSE: 126 mg/dL — AB (ref 65–99)
Potassium: 3.3 mmol/L — ABNORMAL LOW (ref 3.5–5.1)
Sodium: 138 mmol/L (ref 135–145)
TOTAL PROTEIN: 7.9 g/dL (ref 6.5–8.1)

## 2015-05-13 LAB — CBC
HEMATOCRIT: 26.8 % — AB (ref 35.0–47.0)
HEMOGLOBIN: 8.7 g/dL — AB (ref 12.0–16.0)
MCH: 30.3 pg (ref 26.0–34.0)
MCHC: 32.6 g/dL (ref 32.0–36.0)
MCV: 93 fL (ref 80.0–100.0)
Platelets: 173 10*3/uL (ref 150–440)
RBC: 2.88 MIL/uL — AB (ref 3.80–5.20)
RDW: 19.7 % — ABNORMAL HIGH (ref 11.5–14.5)
WBC: 9.9 10*3/uL (ref 3.6–11.0)

## 2015-05-13 LAB — CULTURE, BLOOD (ROUTINE X 2)
Culture: NO GROWTH
Culture: NO GROWTH

## 2015-05-13 LAB — CRYOGLOBULIN

## 2015-05-13 LAB — PHOSPHORUS: PHOSPHORUS: 3.7 mg/dL (ref 2.5–4.6)

## 2015-05-13 LAB — MAGNESIUM: MAGNESIUM: 1.8 mg/dL (ref 1.7–2.4)

## 2015-05-13 MED ORDER — CLONAZEPAM 0.5 MG PO TABS
0.5000 mg | ORAL_TABLET | Freq: Two times a day (BID) | ORAL | Status: DC
  Administered 2015-05-13 – 2015-05-15 (×5): 0.5 mg via ORAL
  Filled 2015-05-13 (×5): qty 1

## 2015-05-13 MED ORDER — POTASSIUM CHLORIDE CRYS ER 20 MEQ PO TBCR
20.0000 meq | EXTENDED_RELEASE_TABLET | Freq: Two times a day (BID) | ORAL | Status: AC
  Administered 2015-05-13 (×2): 20 meq via ORAL
  Filled 2015-05-13 (×2): qty 1

## 2015-05-13 NOTE — Consult Note (Signed)
[facility] Cardiology  CARDIOLOGY CONSULT NOTE  Patient ID: Jean Tucker MRN: [id number] DOB/AGE: 62-Oct-1952 62 y.o.  Admit date: 05/04/2015 Referring Physician [facility] Primary Physician  Primary Cardiologist  Reason for Consultation paroxysmal atrial fibrillation  HPI: 64 year old female referred for evaluation of paroxysmal atrial fibrillation. Patient has recent history of pneumonia and acute renal failure, recently left [facility] AMA, readmitted with respiratory failure, persistent bilateral pneumonia and hypoxia, compicated by acute renal failure due to ATN. Telemetry reveals intermittent atrial fibrillation. Patient asymptomatic, denies chest pain or palpitations.  Review of systems complete and found to be negative unless listed above     Past Medical History  Diagnosis Date  . Allergy   . GERD (gastroesophageal reflux disease)   . CAD (coronary artery disease)   . Hyperlipidemia   . Thyroid disease   . Osteoporosis   . Rheumatoid arthritis ([place])   . Esophageal varices ([place]) 09/22/2014    April, 2016-grade 2-3 varices  . Hypothyroidism   . Cirrhosis [facility])     Past Surgical History  Procedure Laterality Date  . Abdominal hysterectomy  1983    partial,bleeding  . Rotator cuff repair      X 4  . Tendon release    . Carpal tunnel release      X 2, right  . Esophagogastroduodenoscopy N/A 12/03/2014    Procedure: ESOPHAGOGASTRODUODENOSCOPY (EGD);  Surgeon: Manya Silvas, MD;  Location: [facility] ENDOSCOPY;  Service: Endoscopy;  Laterality: N/A;  . Esophagogastroduodenoscopy N/A 04/23/2015    Procedure: UPPER ENDOSCOPY;  Surgeon: Hulen Luster, MD;  Location: [facility] ENDOSCOPY;  Service: Gastroenterology;  Laterality: N/A;    Prescriptions prior to admission  Medication Sig Dispense Refill Last Dose  . lactulose, encephalopathy, (CHRONULAC) 10 GM/15ML SOLN Take 30 mLs by mouth daily.   05/07/2015 at Unknown time  . levothyroxine (SYNTHROID, LEVOTHROID) 100 MCG tablet Take 1  tablet (100 mcg total) by mouth daily. 90 tablet 3 05/07/2015 at Unknown time  . metoprolol tartrate (LOPRESSOR) 25 MG tablet Take 0.5 tablets by mouth 2 (two) times daily.   05/07/2015 at Unknown time  . omeprazole (PRILOSEC) 20 MG capsule Take 1 capsule by mouth daily.   05/07/2015 at Unknown time  . PROAIR HFA 108 (90 BASE) MCG/ACT inhaler Inhale 2 puffs into the lungs every 4 (four) hours as needed for wheezing.    prn at prn  . RENVELA 800 MG tablet Take 2 tablets by mouth 3 (three) times daily with meals.   05/07/2015 at Unknown time  . XIFAXAN 550 MG TABS tablet Take 1 tablet by mouth 2 (two) times daily.   05/07/2015 at Unknown time  . zolpidem (AMBIEN) 5 MG tablet Take 1 tablet (5 mg total) by mouth at bedtime as needed for sleep (caution of sedation). 90 tablet 1 prn at prn   Social History   Social History  . Marital Status: Married    Spouse Name: N/A  . Number of Children: 3  . Years of Education: N/A   Occupational History  . Retired    Social History Main Topics  . Smoking status: Former Smoker    Types: Cigarettes    Quit date: 05/23/2009  . Smokeless tobacco: Never Used  . Alcohol Use: No     Comment: Occassionally  . Drug Use: No  . Sexual Activity: Not on file   Other Topics Concern  . Not on file   Social History Narrative    Family History  Problem Relation Age  of Onset  . Diabetes Mother   . Hypertension Mother   . Alcohol abuse Father   . Ovarian cancer Mother   . Colon cancer Mother   . Colon polyps Mother   . Esophageal cancer Neg Hx   . Gallbladder disease Neg Hx   . Kidney disease Neg Hx       Review of systems complete and found to be negative unless listed above      PHYSICAL EXAM  General: Well developed, well nourished, in no acute distress HEENT:  Normocephalic and atramatic Neck:  No JVD.  Lungs: Clear bilaterally to auscultation and percussion. Heart: HRRR . Normal S1 and S2 without gallops or murmurs.  Abdomen: Bowel  sounds are positive, abdomen soft and non-tender  Msk:  Back normal, normal gait. Normal strength and tone for age. Extremities: No clubbing, cyanosis or edema.   Neuro: Alert and oriented X 3. Psych:  Good affect, responds appropriately  Labs:   Lab Results  Component Value Date   WBC 9.9 05/13/2015   HGB 8.7* 05/13/2015   HCT 26.8* 05/13/2015   MCV 93.0 05/13/2015   PLT 173 05/13/2015    Recent Labs Lab 05/13/15 0418  NA 138  K 3.3*  CL 100*  CO2 31  BUN 28*  CREATININE 3.36*  CALCIUM 8.6*  PROT 7.9  BILITOT 1.2  ALKPHOS 55  ALT 13*  AST 24  GLUCOSE 126*   Lab Results  Component Value Date   CKMB 2.0 05/10/2015   TROPONINI 0.04* 05/10/2015    Lab Results  Component Value Date   CHOL 219* 06/20/2014   CHOL 273* 06/19/2013   CHOL 265* 10/14/2011   Lab Results  Component Value Date   HDL 26.20* 06/20/2014   HDL 26.20* 06/19/2013   HDL 23.70* 10/14/2011   Lab Results  Component Value Date   LDLCALC 175* 06/20/2014   LDLCALC 112* 10/12/2006   LDLCALC 123* 06/12/2006   Lab Results  Component Value Date   TRIG 72 04/23/2015   TRIG 88.0 06/20/2014   TRIG 75.0 06/19/2013   Lab Results  Component Value Date   CHOLHDL 8 06/20/2014   CHOLHDL 10 06/19/2013   CHOLHDL 11 10/14/2011   Lab Results  Component Value Date   LDLDIRECT 225.7 06/19/2013   LDLDIRECT 223.4 10/14/2011   LDLDIRECT 230.8 11/26/2009      Radiology: Dg Chest 1 View  04/24/2015  CLINICAL DATA:  Hypoxia EXAM: CHEST 1 VIEW COMPARISON:  April 23, 2015 FINDINGS: Endotracheal tube tip is 4.6 cm above the carina. No pneumothorax. There is atelectatic change in the left base. Lungs elsewhere are clear. Heart is borderline enlarged with pulmonary vascularity within normal limits. No adenopathy. No bone lesions. IMPRESSION: Endotracheal tube as described without pneumothorax. Left base atelectasis. Heart borderline enlarged, stable. Electronically Signed   By: Lowella Grip III M.D.   On:  04/24/2015 11:00   US Renal  05/23/2015  CLINICAL DATA:  Acute renal failure EXAM: RENAL / URINARY TRACT ULTRASOUND COMPLETE COMPARISON:  CT 08/28/2014 FINDINGS: Right Kidney: Length: 12.7 cm. Echogenicity within normal limits. No mass or hydronephrosis visualized. Left Kidney: Length: 14.0 cm. Echogenicity within normal limits. No mass or hydronephrosis visualized. Bladder: Foley catheter in place, decompressed and not well visualized. IMPRESSION: No acute findings. Electronically Signed   By: Rolm Baptise M.D.   On: 05/09/2015 11:39   Dg Chest Port 1 View  05/13/2015  CLINICAL DATA:  Respiratory failure, pneumonia, acute renal failure, cirrhosis, former smoker.  EXAM: PORTABLE CHEST 1 VIEW COMPARISON:  Portable chest x-ray of May 12, 2015 FINDINGS: The lungs are reasonably well inflated. Bilateral increased interstitial markings persist with areas of confluence bilaterally. This most conspicuous in the upper lobes. The cardiac silhouette remains enlarged. The left hemidiaphragm remains obscured. The right hemidiaphragm is better demonstrated today. The trachea is midline. The bony thorax exhibits no acute abnormality. IMPRESSION: CHF with bilateral pneumonia not significantly changed since yesterday's study. The volume of pleural fluid on the right has decreased. There remains some pleural fluid on the left. Electronically Signed   By: David  Martinique M.D.   On: 05/13/2015 07:34   Dg Chest Port 1 View  05/12/2015  CLINICAL DATA:  Acute hypoxic respiratory failure. Acute kidney injury superimposed on chronic renal disease. EXAM: PORTABLE CHEST 1 VIEW COMPARISON:  Single view of the chest 05/11/2015 and 05/09/2015. FINDINGS: Since yesterday's examination, there has been increase in a moderate right pleural effusion. Smaller left pleural effusion is unchanged. Asymmetric bilateral airspace disease persists without marked change. There is cardiomegaly. IMPRESSION: No marked change in asymmetric bilateral  airspace disease which could be due to edema and/or pneumonia. Increased moderate pleural effusion. Small left pleural effusion is unchanged. Electronically Signed   By: Inge Rise M.D.   On: 05/12/2015 07:31   Dg Chest Port 1 View  05/11/2015  CLINICAL DATA:  Shortness of breath. EXAM: PORTABLE CHEST 1 VIEW COMPARISON:  05/09/2015. FINDINGS: Cardiomegaly with diffuse bilateral pulmonary alveolar infiltrates again noted. There is interim improvement from prior exam. Small left pleural effusion cannot be excluded. No pneumothorax. IMPRESSION: Cardiomegaly with persistent bilateral pulmonary alveolar infiltrates, predominantly in the upper lobes. Slight interim improvement from prior exam. Small left pleural effusion cannot be excluded. Findings consistent with improving congestive heart failure. Bilateral upper lobe pneumonia cannot be excluded . Electronically Signed   By: [place]   On: 05/11/2015 08:56   Dg Chest Port 1 View  05/09/2015  CLINICAL DATA:  Respiratory distress EXAM: PORTABLE CHEST - 1 VIEW COMPARISON:  05/15/2015 FINDINGS: Cardiac shadow is stable. Patchy upper lobe infiltrates are again seen with some mild improvement when compare with the prior exam. Overall improved aeration is noted. IMPRESSION: Persistent but somewhat improved upper lobe infiltrates bilaterally. Electronically Signed   By: Inez Catalina M.D.   On: 05/09/2015 11:23   Dg Chest Port 1 View  05/16/2015  CLINICAL DATA:  Shortness of breath.  Dyspnea and hypoxia. EXAM: PORTABLE CHEST 1 VIEW COMPARISON:  04/24/2015 FINDINGS: Shallow inspiration. Cardiac enlargement. Prominent pulmonary vascularity. Bilateral perihilar and upper lobe airspace infiltrates most likely represent edema although bilateral pneumonia could have this appearance. Probable small bilateral pleural effusions. No pneumothorax. Postoperative changes in the right shoulder. IMPRESSION: Cardiac enlargement with pulmonary vascular congestion and  bilateral parenchymal airspace disease likely to represent edema although bilateral pneumonia could have this appearance. Small bilateral pleural effusions. Electronically Signed   By: Lucienne Capers M.D.   On: 05/18/2015 02:44   Dg Chest Port 1 View  04/23/2015  CLINICAL DATA:  Respiratory failure on mechanically assisted ventilation. EXAM: PORTABLE CHEST 1 VIEW COMPARISON:  12/01/2014 FINDINGS: Endotracheal tube is 3 cm above the carina. Mild ground-glass opacities in the central lung regions could represent alveolar edema or infiltrate. Mild central vascular prominence. No large effusions. No pneumothorax. IMPRESSION: Satisfactory ET tube position. Mild vascular prominence and perihilar ground-glass opacities, possibly due to congestive failure or volume overload. Electronically Signed   By: Andreas Newport M.D.   On:  04/23/2015 23:03    EKG: Sinus rhythm with intermittent atrial fibrillation  ASSESSMENT AND PLAN:   1. Paroxysmal atrial fibrillation, partially compensatory to hypoxia and bilateral pneumonia, overall appears clinically stable. 2. Bilateral pneumonia, hypoxia with interstitial edema  Recommendations  1. Agree with overall current therapy 2. Defer chronic anticoagulation at this time, especially in light of recent history of variceal bleed 3. Continue metoprolol titrate, up titrate as needed to control rate 4. 2-D echocardiogram if not performed in the past 6 months    Signed: Shellsea Borunda MD,PhD, [facility] 05/13/2015, 4:10 PM

## 2015-05-13 NOTE — Progress Notes (Signed)
Pharmacy Consult for Electrolyte Monitoring and Replacement  Allergies  Allergen Reactions  . Codeine Anaphylaxis  . Arava [Leflunomide] Other (See Comments)    Pt states that this medication causes liver damage.   . Atorvastatin Other (See Comments)    Pt states that this medication causes liver damage.   . Humira [Adalimumab] Other (See Comments)    Pt states that this medication causes liver damage.   . Methotrexate Derivatives Other (See Comments)    Pt states that this medication causes liver damage.   . Simvastatin Other (See Comments)    Pt states that this medication causes liver damage and joint pain.     Patient Measurements: Height: 5\' 4"  (162.6 cm) Weight: 205 lb 11 oz (93.3 kg) IBW/kg (Calculated) : 54.7  Vital Signs: Temp: 97.6 F (36.4 C) (12/21 0730) Temp Source: Oral (12/21 0730) BP: 135/61 mmHg (12/21 1016) Pulse Rate: 73 (12/21 1016) Intake/Output from previous day: 12/20 0701 - 12/21 0700 In: 916.5 [P.O.:620; I.V.:196.5; IV Piggyback:100] Out: 1236 [Urine:1550] Intake/Output from this shift: Total I/O In: 14 [I.V.:14] Out: 250 [Urine:250]  Labs:  Recent Labs  05/11/15 0614 05/12/15 0415 05/13/15 0418  WBC 14.4* 12.0* 9.9  HGB 8.6* 8.9* 8.7*  HCT 27.4* 28.0* 26.8*  PLT 265 205 173     Recent Labs  05/11/15 0614 05/11/15 1449 05/12/15 0415 05/13/15 0418  NA 136  --  139 138  K 3.8  --  4.2 3.3*  CL 102  --  103 100*  CO2 25  --  29 31  GLUCOSE 140*  --  132* 126*  BUN 47*  --  31* 28*  CREATININE 5.38*  --  3.78* 3.36*  CALCIUM 8.8*  --  8.7* 8.6*  MG  --   --   --  1.8  PHOS 6.5* 4.8*  --  3.7  PROT  --   --   --  7.9  ALBUMIN 2.6*  --   --  2.5*  AST  --   --   --  24  ALT  --   --   --  13*  ALKPHOS  --   --   --  55  BILITOT  --   --   --  1.2   Estimated Creatinine Clearance: 19.2 mL/min (by C-G formula based on Cr of 3.36).    Recent Labs  05/11/15 0903  GLUCAP 115*    Medical History: Past Medical History   Diagnosis Date  . Allergy   . GERD (gastroesophageal reflux disease)   . CAD (coronary artery disease)   . Hyperlipidemia   . Thyroid disease   . Osteoporosis   . Rheumatoid arthritis ([place])   . Esophageal varices ([place]) 09/22/2014    April, 2016-grade 2-3 varices  . Hypothyroidism   . Cirrhosis ([place])     Medications:  Scheduled:  . antiseptic oral rinse  7 mL Mouth Rinse BID  . antiseptic oral rinse  7 mL Mouth Rinse QID  . chlorhexidine gluconate  15 mL Mouth Rinse BID  . clonazePAM  0.5 mg Oral BID  . heparin subcutaneous  5,000 Units Subcutaneous Q12H  . ipratropium-albuterol  3 mL Nebulization Q6H  . lactulose  20 g Oral Daily  . levothyroxine  100 mcg Oral Q0600  . metoprolol tartrate  12.5 mg Oral BID  . pantoprazole  40 mg Oral Daily  . piperacillin-tazobactam (ZOSYN)  IV  3.375 g Intravenous Q12H  . potassium chloride  20 mEq  Oral BID  . rifaximin  550 mg Oral BID  . sevelamer carbonate  1,600 mg Oral TID WC  . sodium chloride  3 mL Intravenous Q12H   Infusions:  . furosemide (LASIX) infusion 4 mg/hr (05/13/15 1014)    Assessment: Pharmacy consulted to assist in managing electrolytes in this 64 y/o F with ARF currently requiring HD.  K= 3.3  Plan:  Will replace potassium with 20 meq po x 2 and f/u am labs.   Ulice Dash D 05/13/2015,12:36 PM

## 2015-05-13 NOTE — Progress Notes (Signed)
Called to place pt on Bipap at [place]. Called back at 0100, pt had taken the Bipap mask off an stated she felt better with the cannula on. Pt husband at bedside and Cruzita Lederer aware.

## 2015-05-13 NOTE — Evaluation (Signed)
Physical Therapy Evaluation Patient Details Name: ZAHRIAH SAKAMOTO MRN: [id number] DOB: [birth year] Today's Date: 05/13/2015   History of Present Illness  presented to ER secondary to respiratory distress, AMS; admitted with HCAP and urgent need for dialysis (R temp fem cath placed 12/21).  Of note, patient recently admitted to [facility] for TIPS procedure, course complicated by encephalopathy, NSTEMI, pulmonary edema and AKI; patient signed out from ICU [facility] 12/15 and presented to [facility] 12/16.  Clinical Impression  Upon evaluation, patient alert and oriented to basic information; pleasant and cooperative with all evaluation components.  Bilat UE, L LE strength and ROM grossly WFL, at least 3+/5; R LE not tested due to R temp fem cath placement.  Patient with good effort during supine therex, but demonstrates marked SOB with minimal exertion (vitals stable and WFL).  Will continue to advance/assess mobility as medically appropriate. Would benefit from skilled PT to address above deficits and promote optimal return to PLOF; patient eager and hopeful for discharge home, will continue to assess/recommend upon additional mobility assessment.     Follow Up Recommendations  (to be further determined upon additional mobility assessment)    Equipment Recommendations       Recommendations for Other Services       Precautions / Restrictions Precautions Precautions: Fall Precaution Comments: R temp fem cath, contact isolation Restrictions Weight Bearing Restrictions: No      Mobility  Bed Mobility               General bed mobility comments: deferred secondary to R temp fem cath  Transfers                 General transfer comment: deferred secondary to R temp fem cath  Ambulation/Gait             General Gait Details: deferred secondary to R temp fem cath  Stairs            Wheelchair Mobility    Modified Rankin (Stroke Patients Only)       Balance                                              Pertinent Vitals/Pain Pain Assessment: No/denies pain    Home Living Family/patient expects to be discharged to:: Private residence Living Arrangements: Spouse/significant other Available Help at Discharge: Family   Home Access: Stairs to enter Entrance Stairs-Rails: Can reach both Entrance Stairs-Number of Steps: 5 Home Layout: One level Home Equipment: Environmental consultant - 2 wheels;Walker - 4 wheels;Cane - single point;Wheelchair - manual (all belonged to mother)      Prior Function Level of Independence: Independent         Comments: Indep with household and community mobiltiy; denies fall history.     Hand Dominance        Extremity/Trunk Assessment   Upper Extremity Assessment: Overall WFL for tasks assessed           Lower Extremity Assessment: Generalized weakness (R LE not tested secondary to R temp fem cath; L LE grossly at least 3+/5)         Communication   Communication: No difficulties  Cognition Arousal/Alertness: Awake/alert Behavior During Therapy: WFL for tasks assessed/performed Overall Cognitive Status: Within Functional Limits for tasks assessed  General Comments      Exercises Other Exercises Other Exercises: Bilat ankle pumps, quad sets, 1x10; L LE SAQs, heel slides, hip abduct/adduct and SLR.  Marked SOB with minimal activity, though sats remain WFL on supplemental O2.      Assessment/Plan    PT Assessment Patient needs continued PT services  PT Diagnosis Difficulty walking;Generalized weakness   PT Problem List Decreased strength;Decreased range of motion;Decreased activity tolerance;Decreased balance;Decreased mobility;Decreased knowledge of use of DME;Decreased safety awareness;Decreased knowledge of precautions;Cardiopulmonary status limiting activity  PT Treatment Interventions DME instruction;Gait training;Stair training;Therapeutic activities;Functional  mobility training;Therapeutic exercise;Patient/family education   PT Goals (Current goals can be found in the Care Plan section) Acute Rehab PT Goals Patient Stated Goal: "to move around" PT Goal Formulation: With patient Time For Goal Achievement: 05/27/15 Potential to Achieve Goals: Good Additional Goals Additional Goal #1: Assess OOB and mobility as medically appropriate.    Frequency Min 2X/week   Barriers to discharge        Co-evaluation               End of Session Equipment Utilized During Treatment: Gait belt Activity Tolerance: Patient limited by fatigue Patient left: in bed;with call bell/phone within reach           Time: 0923-0939 PT Time Calculation (min) (ACUTE ONLY): 16 min   Charges:   PT Evaluation $Initial PT Evaluation Tier I: 1 Procedure PT Treatments $Therapeutic Exercise: 8-22 mins   PT G Codes:       Layaan Mott H. Owens Shark, PT, DPT, NCS 05/13/2015, 4:48 PM [phone]

## 2015-05-13 NOTE — Progress Notes (Signed)
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 Patient signed out AMA from [place] yesterday. Patient noted to have acute renal failure developed after TIPS procedure. Also has pneumonia. At this time patient is admitted for healthcare associated pneumonia, acute renal failure.   CHIEF COMPLAINT:   Chief Complaint  Patient presents with  . Shortness of Breath   Groggy, husband thinks she has received sedating medication, short shallow respirations  REVIEW OF SYSTEMS:   Review of Systems  Constitutional: Positive for malaise/fatigue. Negative for fever.  Respiratory: Positive for shortness of breath and wheezing.   Cardiovascular: Positive for chest pain and palpitations.  Gastrointestinal: Negative for nausea, vomiting and abdominal pain.  Genitourinary: Negative for dysuria.    DRUG ALLERGIES:   Allergies  Allergen Reactions  . Codeine Anaphylaxis  . Arava [Leflunomide] Other (See Comments)    Pt states that this medication causes liver damage.   . Atorvastatin Other (See Comments)    Pt states that this medication causes liver damage.   . Humira [Adalimumab] Other (See Comments)    Pt states that this medication causes liver damage.   . Methotrexate Derivatives Other (See Comments)    Pt states that this medication causes liver damage.   . Simvastatin Other (See Comments)    Pt states that this medication causes liver damage and joint pain.     VITALS:  Blood pressure 116/53, pulse 78, temperature 97.6 F (36.4 C), temperature source Oral, resp. rate 23, height 5\' 4"  (1.626 m), weight 93.3 kg (205 lb 11 oz), SpO2 100 %.  PHYSICAL EXAMINATION:  GENERAL:  64  y.o.-year-old patient sitting up in bed, uncomfortable, mild respiratory distress LUNGS: Short shallow respirations, upper airway wheezes, no rhonchi or rails, mild respiratory distress, on nasal cannula  CARDIOVASCULAR: S1, S2 normal. No murmurs, rubs, or gallops. Tachycardic, irregular ABDOMEN: Soft, nontender, nondistended. Bowel sounds present. No organomegaly or mass.  EXTREMITIES: No cyanosis, or clubbing. 2+ bilateral pedal edema NEUROLOGIC: Cranial nerves II through XII are intact. Muscle strength 5/5 in all extremities. Sensation intact. Gait not checked.  PSYCHIATRIC: The patient is alert and oriented x 3. Anxious SKIN: No obvious rash, lesion, or ulcer.    LABORATORY PANEL:   CBC  Recent Labs Lab 05/13/15 0418  WBC 9.9  HGB 8.7*  HCT 26.8*  PLT 173   ------------------------------------------------------------------------------------------------------------------  Chemistries   Recent Labs Lab 05/13/15 0418  NA 138  K 3.3*  CL 100*  CO2 31  GLUCOSE 126*  BUN 28*  CREATININE 3.36*  CALCIUM 8.6*  MG 1.8  AST 24  ALT 13*  ALKPHOS 55  BILITOT 1.2   ------------------------------------------------------------------------------------------------------------------  Cardiac Enzymes  Recent Labs Lab 05/10/15 0158  TROPONINI 0.04*   ------------------------------------------------------------------------------------------------------------------  RADIOLOGY:  Dg Chest Port 1 View  05/13/2015  CLINICAL DATA:  Respiratory failure, pneumonia, acute renal failure, cirrhosis, former smoker. EXAM: PORTABLE CHEST 1 VIEW COMPARISON:  Portable chest x-ray of May 12, 2015 FINDINGS: The lungs are reasonably well inflated. Bilateral increased interstitial markings persist with areas of confluence bilaterally. This most conspicuous in the upper lobes. The cardiac silhouette remains enlarged. The left hemidiaphragm remains obscured. The right hemidiaphragm is better  demonstrated today. The trachea is midline. The bony thorax exhibits no acute abnormality. IMPRESSION: CHF with bilateral pneumonia not significantly changed since yesterday's study. The volume of pleural fluid on the right has decreased. There remains some pleural fluid on the left. Electronically Signed   By: David  Martinique M.D.   On: 05/13/2015 07:34   Dg Chest Port 1 View  05/12/2015  CLINICAL DATA:  Acute hypoxic respiratory failure. Acute kidney injury superimposed on chronic renal disease. EXAM: PORTABLE CHEST 1 VIEW COMPARISON:  Single view of the chest 05/11/2015 and 05/09/2015. FINDINGS: Since yesterday's examination, there has been increase in a moderate right pleural effusion. Smaller left pleural effusion is unchanged. Asymmetric bilateral airspace disease persists without marked change. There is cardiomegaly. IMPRESSION: No marked change in asymmetric bilateral airspace disease which could be due to edema and/or pneumonia. Increased moderate pleural effusion. Small left pleural effusion is unchanged. Electronically Signed   By: Inge Rise M.D.   On: 05/12/2015 07:31    EKG:   Orders placed or performed during the hospital encounter of 05/12/2015  . EKG 12-Lead  . EKG 12-Lead  . ED EKG  . ED EKG  . EKG 12-Lead  . EKG 12-Lead    ASSESSMENT AND PLAN:   1. acute respiratory failure with hypoxia and distress: - Appreciate pulmonology following  - Continue to improve with volume removal via Lasix and hemodialysis  - Continue nasal cannula for now, mild distress  2. Healthcare associated pneumonia:  - Cultured data is negative, chest x-ray with possible bilateral pneumonia - Continue Zosyn  #3 Acute renal failure due to ATN  - Appreciate nephrology following - Received HD 12/17, 12/19, 12/20 - lasix drip started - ? renal infarct  #3 cirrhosis of the liver with a variceal bleed status post banding and tips. Patient had a history of rheumatoid arthritis with autoimmune  hepatitis: Recent history of having a variceal bleed had to be transferred from [facility] ICU to [facility] on December 2 and the patient had a TIPS procedure on December 3. Postop course was complicated by hepatic encephalopathy, NSTMI, pulmonary edema, acute kidney injury - monitor hepatic function . #4 slight confusion; resolved: Continue rifaximin, lactulose and check ammonia level.  #5. Hypothyroidism continue Synthroid.  6 history of rheumatoid arthritis: Intolerant to methotrexate, Humira,ARava  7. Atrial fibrillation: - Appreciate cardiology consultation. Continue metoprolol. No anticoagulation at this time.  CODE STATUS ; full  All the records are reviewed and case discussed with Care Management/Social Workerr. Management plans discussed with the patient, family and they are in agreement.  CODE STATUS: full  TOTAL TIME TAKING CARE OF THIS PATIENT 35 minutes. CCT  POSSIBLE D/C IN 1-2 DAYS, DEPENDING ON CLINICAL CONDITION.   Myrtis Ser M.D on 05/13/2015 at 4:45 PM  Between 7am to 6pm - Pager - [phone]  After 6pm go to [email or web address] - password [email or web address] [facility]  Office  [phone]  CC: Primary care physician; Loura Pardon, MD   Note: This dictation was prepared with Dragon dictation along with smaller phrase technology. Any transcriptional errors that result from this process are unintentional.

## 2015-05-13 NOTE — Progress Notes (Signed)
[facility]  ROUNDING NOTE   Subjective:   Patient feels better this morning Serum creatinine somewhat lower at 3.36 Urine output 1550 cc last 24 hours 2800 so far today Patient was started on IV Lasix infusion on December 20. Good response  Objective:  Vital signs in last 24 hours:  Temp:  [97.6 F (36.4 C)-98.4 F (36.9 C)] 98.2 F (36.8 C) (12/21 1530) Pulse Rate:  [60-141] 78 (12/21 1638) Resp:  [15-40] 26 (12/21 1800) BP: (93-137)/(49-98) 120/73 mmHg (12/21 1800) SpO2:  [93 %-100 %] 100 % (12/21 1638) Weight:  [93.3 kg (205 lb 11 oz)] 93.3 kg (205 lb 11 oz) (12/21 0500)  Weight change: -3.2 kg (-7 lb 0.9 oz) Filed Weights   05/11/15 1215 05/12/15 0500 05/13/15 0500  Weight: 94.5 kg (208 lb 5.4 oz) 93.3 kg (205 lb 11 oz) 93.3 kg (205 lb 11 oz)    Intake/Output: I/O last 3 completed shifts: In: 1206.5 [P.O.:860; I.V.:196.5; IV Piggyback:150] Out: [id number] [Urine:1900]   Intake/Output this shift:  Total I/O In: 440 [P.O.:236; I.V.:154; IV Piggyback:50] Out: 2850 [Urine:2850]  Physical Exam: General: NAD  HENT Keyser oxygen  Eyes: Anicteric,   Neck: Supple, trachea midline  Lungs:  Mild basilar crackles, no wheezing  Heart: irregular rate and rhythm, A Fib  Abdomen:  Soft, nontender, diatended  Extremities: 2+ dependent and peripheral edema.  Neurologic: Nonfocal, moving all four extremities  Skin: No lesions  Access: Right femoral vascath 12/16 Dr. Stevenson Clinch. Day #4  Foley  Basic Metabolic Panel:  Recent Labs Lab 05/09/15 0630 05/10/15 0158 05/11/15 0614 05/11/15 1449 05/12/15 0415 05/13/15 0418  NA 140 137 136  --  139 138  K 4.2 3.6 3.8  --  4.2 3.3*  CL 105 106 102  --  103 100*  CO2 25 23 25   --  29 31  GLUCOSE 131* 110* 140*  --  132* 126*  BUN 48* 48* 47*  --  31* 28*  CREATININE 5.91* 5.70* 5.38*  --  3.78* 3.36*  CALCIUM 8.7* 8.4* 8.8*  --  8.7* 8.6*  MG  --   --   --   --   --  1.8  PHOS  --  6.0* 6.5* 4.8*  --  3.7    Liver  Function Tests:  Recent Labs Lab 05/10/15 0158 05/11/15 0614 05/13/15 0418  AST  --   --  24  ALT  --   --  13*  ALKPHOS  --   --  55  BILITOT  --   --  1.2  PROT  --   --  7.9  ALBUMIN 2.5* 2.6* 2.5*   No results for input(s): LIPASE, AMYLASE in the last 168 hours.  Recent Labs Lab 05/17/2015 0218  AMMONIA 26    CBC:  Recent Labs Lab 05/09/15 0630 05/10/15 0158 05/11/15 0614 05/12/15 0415 05/13/15 0418  WBC 13.2* 14.0* 14.4* 12.0* 9.9  HGB 7.8* 8.7* 8.6* 8.9* 8.7*  HCT 24.9* 27.1* 27.4* 28.0* 26.8*  MCV 92.1 91.5 92.5 93.3 93.0  PLT 212 237 265 205 173    Cardiac Enzymes:  Recent Labs Lab 05/15/2015 0218 05/09/15 1300 05/10/15 0158  CKMB  --  1.8 2.0  TROPONINI 0.06* 0.03 0.04*    BNP: Invalid input(s): POCBNP  CBG:  Recent Labs Lab 05/23/2015 0953 05/11/15 0903  GLUCAP 152* 115*    Microbiology: Results for orders placed or performed during the hospital encounter of 05/10/2015  Culture, blood (Routine X 2)  w Reflex to ID Panel     Status: None   Collection Time: 05/19/2015  2:18 AM  Result Value Ref Range Status   Specimen Description BLOOD LEFT ASSIST CONTROL  Final   Special Requests BOTTLES DRAWN AEROBIC AND ANAEROBIC 5 CC  Final   Culture NO GROWTH 5 DAYS  Final   Report Status 05/13/2015 FINAL  Final  Culture, blood (Routine X 2) w Reflex to ID Panel     Status: None   Collection Time: 05/01/2015  2:18 AM  Result Value Ref Range Status   Specimen Description BLOOD RIGHT ASSIST CONTROL  Final   Special Requests BOTTLES DRAWN AEROBIC AND ANAEROBIC 1 CC  Final   Culture NO GROWTH 5 DAYS  Final   Report Status 05/13/2015 FINAL  Final  Urine culture     Status: None   Collection Time: 04/26/2015  6:05 AM  Result Value Ref Range Status   Specimen Description URINE, RANDOM  Final   Special Requests NONE  Final   Culture 50,000 COLONIES/mL CANDIDA ALBICANS  Final   Report Status 05/11/2015 FINAL  Final    Coagulation Studies: No results for  input(s): LABPROT, INR in the last 72 hours.  Urinalysis: No results for input(s): COLORURINE, LABSPEC, PHURINE, GLUCOSEU, HGBUR, BILIRUBINUR, KETONESUR, PROTEINUR, UROBILINOGEN, NITRITE, LEUKOCYTESUR in the last 72 hours.  Invalid input(s): APPERANCEUR    Imaging: Dg Chest Port 1 View  05/13/2015  CLINICAL DATA:  Respiratory failure, pneumonia, acute renal failure, cirrhosis, former smoker. EXAM: PORTABLE CHEST 1 VIEW COMPARISON:  Portable chest x-ray of May 12, 2015 FINDINGS: The lungs are reasonably well inflated. Bilateral increased interstitial markings persist with areas of confluence bilaterally. This most conspicuous in the upper lobes. The cardiac silhouette remains enlarged. The left hemidiaphragm remains obscured. The right hemidiaphragm is better demonstrated today. The trachea is midline. The bony thorax exhibits no acute abnormality. IMPRESSION: CHF with bilateral pneumonia not significantly changed since yesterday's study. The volume of pleural fluid on the right has decreased. There remains some pleural fluid on the left. Electronically Signed   By: David  Martinique M.D.   On: 05/13/2015 07:34   Dg Chest Port 1 View  05/12/2015  CLINICAL DATA:  Acute hypoxic respiratory failure. Acute kidney injury superimposed on chronic renal disease. EXAM: PORTABLE CHEST 1 VIEW COMPARISON:  Single view of the chest 05/11/2015 and 05/09/2015. FINDINGS: Since yesterday's examination, there has been increase in a moderate right pleural effusion. Smaller left pleural effusion is unchanged. Asymmetric bilateral airspace disease persists without marked change. There is cardiomegaly. IMPRESSION: No marked change in asymmetric bilateral airspace disease which could be due to edema and/or pneumonia. Increased moderate pleural effusion. Small left pleural effusion is unchanged. Electronically Signed   By: Inge Rise M.D.   On: 05/12/2015 07:31     Medications:   . furosemide (LASIX) infusion 4  mg/hr (05/13/15 1800)   . antiseptic oral rinse  7 mL Mouth Rinse BID  . antiseptic oral rinse  7 mL Mouth Rinse QID  . chlorhexidine gluconate  15 mL Mouth Rinse BID  . clonazePAM  0.5 mg Oral BID  . heparin subcutaneous  5,000 Units Subcutaneous Q12H  . ipratropium-albuterol  3 mL Nebulization Q6H  . lactulose  20 g Oral Daily  . levothyroxine  100 mcg Oral Q0600  . metoprolol tartrate  12.5 mg Oral BID  . pantoprazole  40 mg Oral Daily  . piperacillin-tazobactam (ZOSYN)  IV  3.375 g Intravenous Q12H  . potassium  chloride  20 mEq Oral BID  . rifaximin  550 mg Oral BID  . sevelamer carbonate  1,600 mg Oral TID WC  . sodium chloride  3 mL Intravenous Q12H   sodium chloride, sodium chloride, acetaminophen, alteplase, heparin, ipratropium-albuterol, lidocaine (PF), lidocaine-prilocaine, metoprolol, ondansetron **OR** ondansetron (ZOFRAN) IV, pentafluoroprop-tetrafluoroeth, traZODone  Assessment/ Plan:  Jean Tucker is a 64 y.o. white female GERD, hyperlipidemia, osteoporosis, rheumatoid arthritis, cirrhosis of the liver secondary to autoimmune process, esophageal varices status post TIPS procedure, who was admitted to [facility] on 05/06/2015 for evaluation of shortness of breath.  1. Acute renal failure: concern for acute hepatorenal syndrome versus ATN. Hemodialysis 12/16 due to worsening renal function. Nonoliguric urine output. Baseline creatinine of 0.85 from 12/2 Continue Lasix drip for now Monitor renal panel and electrolytes closely Electrolytes and Volume status are acceptable No acute indication for Dialysis at present   2. Liver cirrhosis (autoimmune in etiology) with history of esophogeal varices s/p TIPS at [facility] 04/25/15.  - rifaximin and lactulose for hepatic encephalopathy  3. Acute resp failure from Pneumonia and Acute pulmonary edema:  - empiric broad spectrum ABx - clinically better today after volume removal with HD - Urine output has improved with IV Lasix  infusion  5. Generalized edema See above    LOS: 5 Ryoma Nofziger 12/21/20166:42 PM

## 2015-05-13 NOTE — Progress Notes (Signed)
PCCM signed off 12/18 as pt was transferred out of ICU Pt transferred back to ICU/SDU on BiPAP 12/19.  Resp status improved after HD 12/19 Severe delirium after Ambien 12/19 PM Back to baseline mentation today. No respiratory distress this AM  Filed Vitals:   05/13/15 0730 05/13/15 0749 05/13/15 0800 05/13/15 1016  BP:   111/56 135/61  Pulse:   76 73  Temp: 97.6 F (36.4 C)     TempSrc: Oral     Resp:   23   Height:      Weight:      SpO2:  96% 96%    NAD JVP not visualized Bibasilar crackles - improved RRR s M NABS, soft, NT No LE edema  BMP Latest Ref Rng 05/13/2015 05/12/2015 05/11/2015  Glucose 65 - 99 mg/dL 126(H) 132(H) 140(H)  BUN 6 - 20 mg/dL 28(H) 31(H) 47(H)  Creatinine 0.44 - 1.00 mg/dL 3.36(H) 3.78(H) 5.38(H)  Sodium 135 - 145 mmol/L 138 139 136  Potassium 3.5 - 5.1 mmol/L 3.3(L) 4.2 3.8  Chloride 101 - 111 mmol/L 100(L) 103 102  CO2 22 - 32 mmol/L 31 29 25   Calcium 8.9 - 10.3 mg/dL 8.6(L) 8.7(L) 8.8(L)    CBC Latest Ref Rng 05/13/2015 05/12/2015 05/11/2015  WBC 3.6 - 11.0 K/uL 9.9 12.0(H) 14.4(H)  Hemoglobin 12.0 - 16.0 g/dL 8.7(L) 8.9(L) 8.6(L)  Hematocrit 35.0 - 47.0 % 26.8(L) 28.0(L) 27.4(L)  Platelets 150 - 440 K/uL 173 205 265   PCT 0.63 BNP 1170  CXR: [place]  IMPRESSION: 1) Acute hypoxic respiratory failure with bilateral pulmonary infiltrates - PNA and/or edema. Although infiltrates are asymmetric.  - BNP elevated suggesting edema component  Lasix ordered by Dr Candiss Norse - PCT is low suggesting resolving PNA or noninfectious process 2) PAF > NSR 3) AKI on CKD. Presently HD dependent 4) Autoimmune hepatitis/cirrhosis 5) Acute delirium 12/19 PM - likely ambien induced  PLAN/REC: Cont to monitor in SDU today Cont pip-tazo for total of 7 days - stop date ordered CXR in AM 12/21 - dec Rt pleural effusion Doing well today. No HD today  CCM Time - 35 mins  Vilinda Boehringer, MD [facility] Pager [phone] (please  enter 7-digits) On Call Pager - [phone] (please enter 7-digits)

## 2015-05-13 NOTE — Progress Notes (Addendum)
Pharmacy Antibiotic Follow-up Note  Jean Tucker is a 64 y.o. year-old female admitted on 05/16/2015.  The patient is currently on day 6 of Zosyn  for HCAP.  Assessment/Plan: After discussion with Dr. Stevenson Clinch, the length of therapy for Zosyn for HCAP is 7 days. The stop date is entered and will be 12/23. Will continue Zosyn 3.375 G EI q 12 hours through 12/23.   Temp (24hrs), Avg:98.1 F (36.7 C), Min:97.6 F (36.4 C), Max:98.4 F (36.9 C)   Recent Labs Lab 05/09/15 0630 05/10/15 0158 05/11/15 0614 05/12/15 0415 05/13/15 0418  WBC 13.2* 14.0* 14.4* 12.0* 9.9    Recent Labs Lab 05/09/15 0630 05/10/15 0158 05/11/15 0614 05/12/15 0415 05/13/15 0418  CREATININE 5.91* 5.70* 5.38* 3.78* 3.36*   Estimated Creatinine Clearance: 19.2 mL/min (by C-G formula based on Cr of 3.36).    Allergies  Allergen Reactions  . Codeine Anaphylaxis  . Arava [Leflunomide] Other (See Comments)    Pt states that this medication causes liver damage.   . Atorvastatin Other (See Comments)    Pt states that this medication causes liver damage.   . Humira [Adalimumab] Other (See Comments)    Pt states that this medication causes liver damage.   . Methotrexate Derivatives Other (See Comments)    Pt states that this medication causes liver damage.   . Simvastatin Other (See Comments)    Pt states that this medication causes liver damage and joint pain.     Antimicrobials this admission: Zosyn 12/16 >> 12/22 Levaquin  12/16 >>  12/19 Vancomycin 12/16>> 12/19  Microbiology results: 12/16 Urine cx: 50 k Candida albicans 12/16 Blood cx: NGTD x 2    Thank you for allowing pharmacy to be a part of this patient's care.  Ulice Dash D PharmD 05/13/2015 12:40 PM

## 2015-05-14 ENCOUNTER — Inpatient Hospital Stay (HOSPITAL_COMMUNITY)
Admit: 2015-05-14 | Discharge: 2015-05-14 | Disposition: A | Discharge status: Home / Self Care | Payer: BLUE CROSS/BLUE SHIELD | Attending: Internal Medicine | Admitting: Internal Medicine

## 2015-05-14 DIAGNOSIS — R0600 Dyspnea, unspecified: Secondary | ICD-10-CM

## 2015-05-14 LAB — CBC
HCT: 26.3 % — ABNORMAL LOW (ref 35.0–47.0)
Hemoglobin: 8.3 g/dL — ABNORMAL LOW (ref 12.0–16.0)
MCH: 29.3 pg (ref 26.0–34.0)
MCHC: 31.7 g/dL — AB (ref 32.0–36.0)
MCV: 92.3 fL (ref 80.0–100.0)
PLATELETS: 158 10*3/uL (ref 150–440)
RBC: 2.85 MIL/uL — AB (ref 3.80–5.20)
RDW: 19.6 % — ABNORMAL HIGH (ref 11.5–14.5)
WBC: 9.9 10*3/uL (ref 3.6–11.0)

## 2015-05-14 LAB — PROTEIN ELECTRO, RANDOM URINE
ALBUMIN ELP UR: 27.9 %
Alpha-1-Globulin, U: 0.5 %
Alpha-2-Globulin, U: 4.3 %
Beta Globulin, U: 21.2 %
Gamma Globulin, U: 46.1 %
TOTAL PROTEIN, URINE-UPE24: 8.7 mg/dL

## 2015-05-14 LAB — BASIC METABOLIC PANEL
Anion gap: 8 (ref 5–15)
BUN: 29 mg/dL — AB (ref 6–20)
CO2: 32 mmol/L (ref 22–32)
Calcium: 8.6 mg/dL — ABNORMAL LOW (ref 8.9–10.3)
Chloride: 101 mmol/L (ref 101–111)
Creatinine, Ser: 3.53 mg/dL — ABNORMAL HIGH (ref 0.44–1.00)
GFR, EST AFRICAN AMERICAN: 15 mL/min — AB (ref >60)
GFR, EST NON AFRICAN AMERICAN: 13 mL/min — AB (ref >60)
Glucose, Bld: 132 mg/dL — ABNORMAL HIGH (ref 65–99)
POTASSIUM: 3.7 mmol/L (ref 3.5–5.1)
SODIUM: 141 mmol/L (ref 135–145)

## 2015-05-14 LAB — MAGNESIUM: MAGNESIUM: 1.7 mg/dL (ref 1.7–2.4)

## 2015-05-14 LAB — PHOSPHORUS: PHOSPHORUS: 4.6 mg/dL (ref 2.5–4.6)

## 2015-05-14 LAB — EOSINOPHIL, URINE: EOSINOPHIL, URINE: NONE SEEN %

## 2015-05-14 NOTE — Progress Notes (Signed)
PT Cancellation Note  Patient Details Name: Jean Tucker MRN: [id number] DOB: August 05, 1950   Cancelled Treatment:    Reason Eval/Treat Not Completed: Patient declined, no reason specified. Chart reviewed and RN consulted. Attempted to work with patient but she refuses therapy at this time. Pt reports she is tired and doesn't want to do therapy. Explained to patient that it may be after the weekend before another attempt can be made. Pt still refuses therapy. Encouraged husband to continue assisting patient with supine and seated exercises. Husband agrees. Attempt will be made on separate date as schedule permits.  Lyndel Safe Jean Tucker PT, DPT   Jean Tucker 05/14/2015, 4:44 PM

## 2015-05-14 NOTE — Progress Notes (Signed)
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 Patient signed out AMA from [place] yesterday. Patient noted to have acute renal failure developed after TIPS procedure. Also has pneumonia. At this time patient is admitted for healthcare associated pneumonia, acute renal failure.   CHIEF COMPLAINT:   Chief Complaint  Patient presents with  . Shortness of Breath   Feeling much better today. Hungry. Energetic. Oriented.  REVIEW OF SYSTEMS:   Review of Systems  Constitutional: Positive for malaise/fatigue. Negative for fever.  Respiratory: Positive for shortness of breath and wheezing.   Cardiovascular: Positive for chest pain and palpitations.  Gastrointestinal: Negative for nausea, vomiting and abdominal pain.  Genitourinary: Negative for dysuria.    DRUG ALLERGIES:   Allergies  Allergen Reactions  . Codeine Anaphylaxis  . Arava [Leflunomide] Other (See Comments)    Pt states that this medication causes liver damage.   . Atorvastatin Other (See Comments)    Pt states that this medication causes liver damage.   . Humira [Adalimumab] Other (See Comments)    Pt states that this medication causes liver damage.   . Methotrexate Derivatives Other (See Comments)    Pt states that this medication causes liver damage.   . Simvastatin Other (See Comments)    Pt states that this medication causes liver damage and joint pain.     VITALS:  Blood pressure 126/55, pulse 76, temperature 98 F (36.7 C), temperature source Oral, resp. rate 20, height 5\' 4"  (1.626 m), weight 90.6 kg (199 lb 11.8 oz), SpO2 100 %.  PHYSICAL EXAMINATION:  GENERAL:  64 y.o.-year-old patient sitting up  in bed, alert, no distress LUNGS: Short shallow respirations, good air movement, no wheezes rhonchi or rales  CARDIOVASCULAR: S1, S2 normal. No murmurs, rubs, or gallops. Tachycardic, irregular ABDOMEN: Soft, nontender, nondistended. Bowel sounds present. No organomegaly or mass.  EXTREMITIES: No cyanosis, or clubbing. +1 bilateral pedal edema NEUROLOGIC: Cranial nerves II through XII are intact. Muscle strength 5/5 in all extremities. Sensation intact. Gait not checked.  PSYCHIATRIC: The patient is alert and oriented x 3. Anxious SKIN: No obvious rash, lesion, or ulcer.    LABORATORY PANEL:   CBC  Recent Labs Lab 05/14/15 0520  WBC 9.9  HGB 8.3*  HCT 26.3*  PLT 158   ------------------------------------------------------------------------------------------------------------------  Chemistries   Recent Labs Lab 05/13/15 0418 05/14/15 0520  NA 138 141  K 3.3* 3.7  CL 100* 101  CO2 31 32  GLUCOSE 126* 132*  BUN 28* 29*  CREATININE 3.36* 3.53*  CALCIUM 8.6* 8.6*  MG 1.8 1.7  AST 24  --   ALT 13*  --   ALKPHOS 55  --   BILITOT 1.2  --    ------------------------------------------------------------------------------------------------------------------  Cardiac Enzymes  Recent Labs Lab 05/10/15 0158  TROPONINI 0.04*   ------------------------------------------------------------------------------------------------------------------  RADIOLOGY:  Dg Chest Port 1 View  05/13/2015  CLINICAL DATA:  Respiratory failure, pneumonia, acute renal failure, cirrhosis, former smoker. EXAM: PORTABLE CHEST 1 VIEW COMPARISON:  Portable chest x-ray of May 12, 2015 FINDINGS: The lungs are reasonably well inflated. Bilateral increased interstitial markings persist with areas of confluence bilaterally. This most conspicuous in the upper lobes. The cardiac silhouette  remains enlarged. The left hemidiaphragm remains obscured. The right hemidiaphragm is better demonstrated today. The  trachea is midline. The bony thorax exhibits no acute abnormality. IMPRESSION: CHF with bilateral pneumonia not significantly changed since yesterday's study. The volume of pleural fluid on the right has decreased. There remains some pleural fluid on the left. Electronically Signed   By: David  Martinique M.D.   On: 05/13/2015 07:34    EKG:   Orders placed or performed during the hospital encounter of 05/11/2015  . EKG 12-Lead  . EKG 12-Lead  . ED EKG  . ED EKG  . EKG 12-Lead  . EKG 12-Lead    ASSESSMENT AND PLAN:   1. acute respiratory failure with hypoxia and distress: - Continues to improve with volume removal via Lasix and hemodialysis  - Continue nasal cannula for now, comfortable - Add incentive spirometry  2. Healthcare associated pneumonia:  - Cultured data is negative, chest x-ray with possible bilateral pneumonia - Continue Zosyn stop date is toow  #3 Acute renal failure due to ATN  - Appreciate nephrology following - Received HD 12/17, 12/19, 12/20 - lasix drip started with good urine output - ? 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  - due to autoimmune hepatitis  - transferred from [facility] to [facility] on 12/2 and had TIPS on 12/3 - continue rifaximin and lactulose for encephalopathy - continue to monitor hepatic function  #4. Hypothyroidism continue Synthroid.  5 history of rheumatoid arthritis: Intolerant to methotrexate, Humira,ARava  6. Atrial fibrillation: - Appreciate cardiology consultation. Continue metoprolol. No anticoagulation at this time.  CODE STATUS: full  All the records are reviewed and case discussed with Care Management/Social Workerr. Management plans discussed with the patient, family and they are in agreement.  CODE STATUS: full  TOTAL TIME TAKING CARE OF THIS PATIENT 35 minutes.   POSSIBLE D/C IN 1-2 DAYS, DEPENDING ON CLINICAL CONDITION.   Myrtis Ser M.D on 05/14/2015 at 1:20  PM  Between 7am to 6pm - Pager - [phone]  After 6pm go to [email or web address] - password [email or web address] [facility]  Office  [phone]  CC: Primary care physician; Loura Pardon, MD   Note: This dictation was prepared with Dragon dictation along with smaller phrase technology. Any transcriptional errors that result from this process are unintentional.

## 2015-05-14 NOTE — Progress Notes (Signed)
*  PRELIMINARY RESULTS* Echocardiogram 2D Echocardiogram has been performed.  Jean Tucker 05/14/2015, 9:36 AM

## 2015-05-14 NOTE — Progress Notes (Signed)
Pharmacy Consult for Electrolyte Monitoring and Replacement  Allergies  Allergen Reactions  . Codeine Anaphylaxis  . Arava [Leflunomide] Other (See Comments)    Pt states that this medication causes liver damage.   . Atorvastatin Other (See Comments)    Pt states that this medication causes liver damage.   . Humira [Adalimumab] Other (See Comments)    Pt states that this medication causes liver damage.   . Methotrexate Derivatives Other (See Comments)    Pt states that this medication causes liver damage.   . Simvastatin Other (See Comments)    Pt states that this medication causes liver damage and joint pain.     Patient Measurements: Height: 5\' 4"  (162.6 cm) Weight: 199 lb 11.8 oz (90.6 kg) IBW/kg (Calculated) : 54.7  Vital Signs: Temp: 98.1 F (36.7 C) (12/21 2329) Temp Source: Oral (12/21 2329) BP: 92/56 mmHg (12/22 0500) Pulse Rate: 92 (12/22 0500) Intake/Output from previous day: 12/21 0701 - 12/22 0700 In: 1124 [P.O.:356; I.V.:358; IV Piggyback:100] Out: 4500 [Urine:4500] Intake/Output from this shift: Total I/O In: 684 [P.O.:120; I.V.:204; Other:310; IV Piggyback:50] Out: [id number] [Urine:1650]  Labs:  Recent Labs  05/12/15 0415 05/13/15 0418 05/14/15 0520  WBC 12.0* 9.9 9.9  HGB 8.9* 8.7* 8.3*  HCT 28.0* 26.8* 26.3*  PLT 205 173 158     Recent Labs  05/11/15 1449 05/12/15 0415 05/13/15 0418 05/14/15 0520  NA  --  139 138 141  K  --  4.2 3.3* 3.7  CL  --  103 100* 101  CO2  --  29 31 32  GLUCOSE  --  132* 126* 132*  BUN  --  31* 28* 29*  CREATININE  --  3.78* 3.36* 3.53*  CALCIUM  --  8.7* 8.6* 8.6*  MG  --   --  1.8 1.7  PHOS 4.8*  --  3.7 4.6  PROT  --   --  7.9  --   ALBUMIN  --   --  2.5*  --   AST  --   --  24  --   ALT  --   --  13*  --   ALKPHOS  --   --  55  --   BILITOT  --   --  1.2  --    Estimated Creatinine Clearance: 18 mL/min (by C-G formula based on Cr of 3.53).    Recent Labs  05/11/15 0903  GLUCAP 115*    Medical  History: Past Medical History  Diagnosis Date  . Allergy   . GERD (gastroesophageal reflux disease)   . CAD (coronary artery disease)   . Hyperlipidemia   . Thyroid disease   . Osteoporosis   . Rheumatoid arthritis ([place])   . Esophageal varices ([place]) 09/22/2014    April, 2016-grade 2-3 varices  . Hypothyroidism   . Cirrhosis ([place])     Medications:  Scheduled:  . antiseptic oral rinse  7 mL Mouth Rinse BID  . antiseptic oral rinse  7 mL Mouth Rinse QID  . chlorhexidine gluconate  15 mL Mouth Rinse BID  . clonazePAM  0.5 mg Oral BID  . heparin subcutaneous  5,000 Units Subcutaneous Q12H  . ipratropium-albuterol  3 mL Nebulization Q6H  . lactulose  20 g Oral Daily  . levothyroxine  100 mcg Oral Q0600  . metoprolol tartrate  12.5 mg Oral BID  . pantoprazole  40 mg Oral Daily  . piperacillin-tazobactam (ZOSYN)  IV  3.375 g Intravenous Q12H  . rifaximin  550 mg Oral BID  . sevelamer carbonate  1,600 mg Oral TID WC  . sodium chloride  3 mL Intravenous Q12H   Infusions:  . furosemide (LASIX) infusion 4 mg/hr (05/14/15 0500)    Assessment: Pharmacy consulted to assist in managing electrolytes in this 64 y/o F with ARF currently requiring HD.  K= 3.3  Plan:  Electrolytes WNL. Will recheck with AM labs.  Laural Benes, Pharm.D., BCPS Clinical Pharmacist 05/14/2015,6:35 AM

## 2015-05-14 NOTE — Progress Notes (Signed)
[facility]  ROUNDING NOTE   Subjective:   Patient feels better this morning Serum creatinine worsened slightly to 3.53 Urine output 4500 cc last 24 hours Patient was started on IV Lasix infusion on December 20. Good response Denies acute shortness of breath   Objective:  Vital signs in last 24 hours:  Temp:  [98 F (36.7 C)-98.3 F (36.8 C)] 98 F (36.7 C) (12/22 1018) Pulse Rate:  [62-129] 76 (12/22 1018) Resp:  [16-35] 20 (12/22 1018) BP: (88-137)/(53-93) 126/55 mmHg (12/22 1018) SpO2:  [90 %-100 %] 100 % (12/22 1018) Weight:  [90.6 kg (199 lb 11.8 oz)] 90.6 kg (199 lb 11.8 oz) (12/22 0500)  Weight change: -2.7 kg (-5 lb 15.2 oz) Filed Weights   05/12/15 0500 05/13/15 0500 05/14/15 0500  Weight: 93.3 kg (205 lb 11 oz) 93.3 kg (205 lb 11 oz) 90.6 kg (199 lb 11.8 oz)    Intake/Output: I/O last 3 completed shifts: In: 1518 [P.O.:476; I.V.:582; Other:310; IV Piggyback:150] Out: 5600 [Urine:5600]   Intake/Output this shift:  Total I/O In: 72 [I.V.:72] Out: 1200 [Urine:1200]  Physical Exam: General: NAD  HENT [place] oxygen  Eyes: Anicteric,   Neck: Supple, trachea midline  Lungs:  Mild basilar crackles, no wheezing  Heart: irregular rate and rhythm, A Fib  Abdomen:  Soft, nontender, diatended  Extremities: 2+ dependent and peripheral edema.  Neurologic: Nonfocal, moving all four extremities  Skin: No lesions  Access: Right femoral vascath 12/16 Dr. Stevenson Clinch. Day 6  Foley  Basic Metabolic Panel:  Recent Labs Lab 05/10/15 0158 05/11/15 0614 05/11/15 1449 05/12/15 0415 05/13/15 0418 05/14/15 0520  NA 137 136  --  139 138 141  K 3.6 3.8  --  4.2 3.3* 3.7  CL 106 102  --  103 100* 101  CO2 23 25  --  29 31 32  GLUCOSE 110* 140*  --  132* 126* 132*  BUN 48* 47*  --  31* 28* 29*  CREATININE 5.70* 5.38*  --  3.78* 3.36* 3.53*  CALCIUM 8.4* 8.8*  --  8.7* 8.6* 8.6*  MG  --   --   --   --  1.8 1.7  PHOS 6.0* 6.5* 4.8*  --  3.7 4.6    Liver  Function Tests:  Recent Labs Lab 05/10/15 0158 05/11/15 0614 05/13/15 0418  AST  --   --  24  ALT  --   --  13*  ALKPHOS  --   --  55  BILITOT  --   --  1.2  PROT  --   --  7.9  ALBUMIN 2.5* 2.6* 2.5*   No results for input(s): LIPASE, AMYLASE in the last 168 hours.  Recent Labs Lab 04/26/2015 0218  AMMONIA 26    CBC:  Recent Labs Lab 05/10/15 0158 05/11/15 0614 05/12/15 0415 05/13/15 0418 05/14/15 0520  WBC 14.0* 14.4* 12.0* 9.9 9.9  HGB 8.7* 8.6* 8.9* 8.7* 8.3*  HCT 27.1* 27.4* 28.0* 26.8* 26.3*  MCV 91.5 92.5 93.3 93.0 92.3  PLT 237 265 205 173 158    Cardiac Enzymes:  Recent Labs Lab 05/07/2015 0218 05/09/15 1300 05/10/15 0158  CKMB  --  1.8 2.0  TROPONINI 0.06* 0.03 0.04*    BNP: Invalid input(s): POCBNP  CBG:  Recent Labs Lab 05/20/2015 0953 05/11/15 0903  GLUCAP 152* 115*    Microbiology: Results for orders placed or performed during the hospital encounter of 05/11/2015  Culture, blood (Routine X 2) w Reflex to ID  Panel     Status: None   Collection Time: 05/15/2015  2:18 AM  Result Value Ref Range Status   Specimen Description BLOOD LEFT ASSIST CONTROL  Final   Special Requests BOTTLES DRAWN AEROBIC AND ANAEROBIC 5 CC  Final   Culture NO GROWTH 5 DAYS  Final   Report Status 05/13/2015 FINAL  Final  Culture, blood (Routine X 2) w Reflex to ID Panel     Status: None   Collection Time: 05/06/2015  2:18 AM  Result Value Ref Range Status   Specimen Description BLOOD RIGHT ASSIST CONTROL  Final   Special Requests BOTTLES DRAWN AEROBIC AND ANAEROBIC 1 CC  Final   Culture NO GROWTH 5 DAYS  Final   Report Status 05/13/2015 FINAL  Final  Urine culture     Status: None   Collection Time: 05/12/2015  6:05 AM  Result Value Ref Range Status   Specimen Description URINE, RANDOM  Final   Special Requests NONE  Final   Culture 50,000 COLONIES/mL CANDIDA ALBICANS  Final   Report Status 05/11/2015 FINAL  Final    Coagulation Studies: No results for  input(s): LABPROT, INR in the last 72 hours.  Urinalysis: No results for input(s): COLORURINE, LABSPEC, PHURINE, GLUCOSEU, HGBUR, BILIRUBINUR, KETONESUR, PROTEINUR, UROBILINOGEN, NITRITE, LEUKOCYTESUR in the last 72 hours.  Invalid input(s): APPERANCEUR    Imaging: Dg Chest Port 1 View  05/13/2015  CLINICAL DATA:  Respiratory failure, pneumonia, acute renal failure, cirrhosis, former smoker. EXAM: PORTABLE CHEST 1 VIEW COMPARISON:  Portable chest x-ray of May 12, 2015 FINDINGS: The lungs are reasonably well inflated. Bilateral increased interstitial markings persist with areas of confluence bilaterally. This most conspicuous in the upper lobes. The cardiac silhouette remains enlarged. The left hemidiaphragm remains obscured. The right hemidiaphragm is better demonstrated today. The trachea is midline. The bony thorax exhibits no acute abnormality. IMPRESSION: CHF with bilateral pneumonia not significantly changed since yesterday's study. The volume of pleural fluid on the right has decreased. There remains some pleural fluid on the left. Electronically Signed   By: David  Martinique M.D.   On: 05/13/2015 07:34     Medications:   . furosemide (LASIX) infusion 4 mg/hr (05/14/15 1000)   . antiseptic oral rinse  7 mL Mouth Rinse BID  . antiseptic oral rinse  7 mL Mouth Rinse QID  . chlorhexidine gluconate  15 mL Mouth Rinse BID  . clonazePAM  0.5 mg Oral BID  . heparin subcutaneous  5,000 Units Subcutaneous Q12H  . ipratropium-albuterol  3 mL Nebulization Q6H  . lactulose  20 g Oral Daily  . levothyroxine  100 mcg Oral Q0600  . metoprolol tartrate  12.5 mg Oral BID  . pantoprazole  40 mg Oral Daily  . piperacillin-tazobactam (ZOSYN)  IV  3.375 g Intravenous Q12H  . rifaximin  550 mg Oral BID  . sevelamer carbonate  1,600 mg Oral TID WC  . sodium chloride  3 mL Intravenous Q12H   sodium chloride, sodium chloride, acetaminophen, alteplase, heparin, ipratropium-albuterol, lidocaine (PF),  lidocaine-prilocaine, metoprolol, ondansetron **OR** ondansetron (ZOFRAN) IV, pentafluoroprop-tetrafluoroeth, traZODone  Assessment/ Plan:  Jean Tucker is a 64 y.o. white female GERD, hyperlipidemia, osteoporosis, rheumatoid arthritis, cirrhosis of the liver secondary to autoimmune process, esophageal varices status post TIPS procedure, who was admitted to [facility] on 05/04/2015 for evaluation of shortness of breath.  1. Acute renal failure:likely ATN after TIPS procedure. Hemodialysis 12/16 due to worsening renal function.   Baseline creatinine of 0.85 from 12/2 Continue Lasix  drip for now Electrolytes and Volume status are acceptable No acute indication for Dialysis at present  Concern about serum creatinine a little worse today despite good urine output of 4500 cc It could be related to relative hypotension with blood pressure staying in the 90-110 range We'll continue to monitor, decrease the dose of Lasix to 2 mg/h  2. 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  - rifaximin and lactulose for hepatic encephalopathy  3. Acute resp failure from Pneumonia and Acute pulmonary edema:  - empiric broad spectrum ABx - clinically better today after volume removal with HD - Urine output has improved with IV Lasix infusion  5. Generalized edema See above    LOS: 6 Davieon Stockham 12/22/201610:32 AM

## 2015-05-14 NOTE — Progress Notes (Signed)
PCCM signed off 12/18 as pt was transferred out of ICU Pt transferred back to ICU/SDU on BiPAP 12/19.  Resp status improved after HD 12/19 Severe delirium after Ambien 12/19 PM Currently back to baseline mentation today.  No respiratory distress this AM  Filed Vitals:   05/14/15 0300 05/14/15 0400 05/14/15 0500 05/14/15 0600  BP: 109/71 120/76 92/56 113/78  Pulse: 88 94 92 101  Temp:      TempSrc:      Resp: 21 29 16 20   Height:      Weight:   199 lb 11.8 oz (90.6 kg)   SpO2: 99% 99% 99% 98%   NAD JVP not visualized Bibasilar crackles - improved RRR s M NABS, soft, NT No LE edema  BMP Latest Ref Rng 05/14/2015 05/13/2015 05/12/2015  Glucose 65 - 99 mg/dL 132(H) 126(H) 132(H)  BUN 6 - 20 mg/dL 29(H) 28(H) 31(H)  Creatinine 0.44 - 1.00 mg/dL 3.53(H) 3.36(H) 3.78(H)  Sodium 135 - 145 mmol/L 141 138 139  Potassium 3.5 - 5.1 mmol/L 3.7 3.3(L) 4.2  Chloride 101 - 111 mmol/L 101 100(L) 103  CO2 22 - 32 mmol/L 32 31 29  Calcium 8.9 - 10.3 mg/dL 8.6(L) 8.6(L) 8.7(L)    CBC Latest Ref Rng 05/14/2015 05/13/2015 05/12/2015  WBC 3.6 - 11.0 K/uL 9.9 9.9 12.0(H)  Hemoglobin 12.0 - 16.0 g/dL 8.3(L) 8.7(L) 8.9(L)  Hematocrit 35.0 - 47.0 % 26.3(L) 26.8(L) 28.0(L)  Platelets 150 - 440 K/uL 158 173 205   PCT 0.63 BNP 1170  CXR: [place]  IMPRESSION: 1) Acute hypoxic respiratory failure with bilateral pulmonary infiltrates - PNA and/or edema. Although infiltrates are asymmetric.  - BNP elevated suggesting edema component  Lasix ordered by Dr Candiss Norse - PCT is low suggesting resolving PNA or noninfectious process 2) PAF > NSR 3) AKI on CKD. Presently HD dependent 4) Autoimmune hepatitis/cirrhosis 5) Acute delirium 12/19 PM - likely ambien induced, now resolved.   PLAN/REC: Cont to monitor in SDU today Cont pip-tazo for total of 7 days - stop date ordered CXR in AM 12/21 - dec Rt pleural effusion Doing well today. Intermittent HD per Renal.  Patient on lasix gtt, and having good  UOP Transfer to [place] floor  Thank you for consulting [facility], we will signoff at this time.  Please feel free to contact us with any questions at [phone] (please enter 7-digits).  CCM Time - 35 mins  Vilinda Boehringer, MD [facility] Pager [phone] (please enter 7-digits) On Call Pager - [phone] (please enter 7-digits)

## 2015-05-15 ENCOUNTER — Inpatient Hospital Stay: Payer: BLUE CROSS/BLUE SHIELD

## 2015-05-15 LAB — COMPREHENSIVE METABOLIC PANEL
ALBUMIN: 2.6 g/dL — AB (ref 3.5–5.0)
ALK PHOS: 49 U/L (ref 38–126)
ALT: 16 U/L (ref 14–54)
ANION GAP: 5 (ref 5–15)
AST: 33 U/L (ref 15–41)
BILIRUBIN TOTAL: 1.1 mg/dL (ref 0.3–1.2)
BUN: 28 mg/dL — ABNORMAL HIGH (ref 6–20)
CALCIUM: 8.7 mg/dL — AB (ref 8.9–10.3)
CO2: 34 mmol/L — ABNORMAL HIGH (ref 22–32)
Chloride: 99 mmol/L — ABNORMAL LOW (ref 101–111)
Creatinine, Ser: 3.55 mg/dL — ABNORMAL HIGH (ref 0.44–1.00)
GFR, EST AFRICAN AMERICAN: 15 mL/min — AB (ref >60)
GFR, EST NON AFRICAN AMERICAN: 13 mL/min — AB (ref >60)
GLUCOSE: 196 mg/dL — AB (ref 65–99)
POTASSIUM: 3.8 mmol/L (ref 3.5–5.1)
Sodium: 138 mmol/L (ref 135–145)
TOTAL PROTEIN: 8.1 g/dL (ref 6.5–8.1)

## 2015-05-15 LAB — CBC
HEMATOCRIT: 26.8 % — AB (ref 35.0–47.0)
HEMOGLOBIN: 8.6 g/dL — AB (ref 12.0–16.0)
MCH: 30.3 pg (ref 26.0–34.0)
MCHC: 32.2 g/dL (ref 32.0–36.0)
MCV: 94 fL (ref 80.0–100.0)
Platelets: 160 10*3/uL (ref 150–440)
RBC: 2.85 MIL/uL — ABNORMAL LOW (ref 3.80–5.20)
RDW: 20.5 % — AB (ref 11.5–14.5)
WBC: 10.9 10*3/uL (ref 3.6–11.0)

## 2015-05-15 LAB — AMMONIA
AMMONIA: 47 umol/L — AB (ref 9–35)
AMMONIA: 38 umol/L — AB (ref 9–35)

## 2015-05-15 LAB — MAGNESIUM: Magnesium: 1.6 mg/dL — ABNORMAL LOW (ref 1.7–2.4)

## 2015-05-15 LAB — PHOSPHORUS: PHOSPHORUS: 5.5 mg/dL — AB (ref 2.5–4.6)

## 2015-05-15 MED ORDER — MAGNESIUM SULFATE 4 GM/100ML IV SOLN
4.0000 g | Freq: Once | INTRAVENOUS | Status: AC
  Administered 2015-05-15: 10:00:00 4 g via INTRAVENOUS
  Filled 2015-05-15: qty 100

## 2015-05-15 NOTE — Progress Notes (Signed)
PT Cancellation Note  Patient Details Name: Jean Tucker MRN: [id number] DOB: 1951-05-18   Cancelled Treatment:    Reason Eval/Treat Not Completed: Fatigue/lethargy limiting ability to participate (Patient sleeping, minimally responsive to stimuli from therapist.  Unable to maintain adequate arousal for participation with session.  Will re-attempt at later time/date as medically appropriate.)  Vinessa Macconnell H. Owens Shark, PT, DPT, NCS 05/15/2015, 3:24 PM [phone]

## 2015-05-15 NOTE — Progress Notes (Signed)
[place] [facility] [place] NAME: Jean Tucker    MR#:  [id number]  DATE OF BIRTH:  1951/05/11  SUBJECTIVE: 64 year old female patient with history of fall autoimmune liver disease with cirrhosis with recent history of for variceal bleed status post a TIPS procedure at [facility]. Patient signed out AMA from [place] yesterday. Patient noted to have acute renal failure developed after TIPS procedure. Also has pneumonia. At this time patient is admitted for healthcare associated pneumonia, acute renal failure.   CHIEF COMPLAINT:   Chief Complaint  Patient presents with  . Shortness of Breath   Groggy. Not eating much. Short shallow respirations  REVIEW OF SYSTEMS:   Review of Systems  Constitutional: Positive for malaise/fatigue. Negative for fever.  Respiratory: Positive for shortness of breath and wheezing.   Cardiovascular: Positive for chest pain and palpitations.  Gastrointestinal: Negative for nausea, vomiting and abdominal pain.  Genitourinary: Negative for dysuria.    DRUG ALLERGIES:   Allergies  Allergen Reactions  . Codeine Anaphylaxis  . Arava [Leflunomide] Other (See Comments)    Pt states that this medication causes liver damage.   . Atorvastatin Other (See Comments)    Pt states that this medication causes liver damage.   . Humira [Adalimumab] Other (See Comments)    Pt states that this medication causes liver damage.   . Methotrexate Derivatives Other (See Comments)    Pt states that this medication causes liver damage.   . Simvastatin Other (See Comments)    Pt states that this medication causes liver damage and joint pain.     VITALS:  Blood pressure 111/46, pulse 99, temperature 97.9 F (36.6 C), temperature source Oral, resp. rate 18, height 5\' 4"  (1.626 m), weight 91.264 kg (201 lb 3.2 oz), SpO2 98 %.  PHYSICAL EXAMINATION:  GENERAL:  64 y.o.-year-old patient sitting up in bed,  groggy LUNGS: Short shallow respirations, fair air movement, no wheezes rhonchi or rales  CARDIOVASCULAR: S1, S2 normal. No murmurs, rubs, or gallops. Tachycardic, irregular ABDOMEN: Soft, nontender, nondistended. Bowel sounds present. No organomegaly or mass.  EXTREMITIES: No cyanosis, or clubbing. +1 bilateral pedal edema NEUROLOGIC: Cranial nerves II through XII are intact. Muscle strength 5/5 in all extremities. Sensation intact. Gait not checked.  PSYCHIATRIC: The patient is groggy but wakes to voice and oriented x 3. Anxious SKIN: No obvious rash, lesion, or ulcer.    LABORATORY PANEL:   CBC  Recent Labs Lab 05/15/15 0528  WBC 10.9  HGB 8.6*  HCT 26.8*  PLT 160   ------------------------------------------------------------------------------------------------------------------  Chemistries   Recent Labs Lab 05/15/15 0528  NA 138  K 3.8  CL 99*  CO2 34*  GLUCOSE 196*  BUN 28*  CREATININE 3.55*  CALCIUM 8.7*  MG 1.6*  AST 33  ALT 16  ALKPHOS 49  BILITOT 1.1   ------------------------------------------------------------------------------------------------------------------  Cardiac Enzymes  Recent Labs Lab 05/10/15 0158  TROPONINI 0.04*   ------------------------------------------------------------------------------------------------------------------  RADIOLOGY:  No results found.  EKG:   Orders placed or performed during the hospital encounter of 04/30/2015  . EKG 12-Lead  . EKG 12-Lead  . ED EKG  . ED EKG  . EKG 12-Lead  . EKG 12-Lead    ASSESSMENT AND PLAN:   1. acute respiratory failure with hypoxia and distress: - Continues to improve with volume removal via Lasix and hemodialysis  - Continue nasal cannula for now,  - Add incentive spirometry - Get chest x-ray  today to reevaluate, she seems more tachypneic  2. Healthcare associated pneumonia:  - Cultured data is negative, chest x-ray with possible bilateral pneumonia - DC Zosyn  today  #3 Acute renal failure due to ATN  - Appreciate nephrology following - Received HD 12/17, 12/19, 12/20 - lasix drip started with good urine output - Renal function still poor, she may need further dialysis treatments  #3 cirrhosis of the liver with variceal bleed status post banding and tips.  - due to autoimmune hepatitis  - transferred from [facility] to [facility] on 12/2 and had TIPS on 12/3 - continue rifaximin and lactulose for encephalopathy - continue to monitor hepatic function  #4. Hypothyroidism continue Synthroid.  5 history of rheumatoid arthritis: Intolerant to methotrexate, Humira,ARava  6. Atrial fibrillation: Rate is controlled - Appreciate cardiology consultation. Continue metoprolol. No anticoagulation at this time.  CODE STATUS: full  All the records are reviewed and case discussed with Care Management/Social Workerr. Management plans discussed with the patient, family and they are in agreement.  CODE STATUS: full  TOTAL TIME TAKING CARE OF THIS PATIENT 35 minutes.   POSSIBLE D/C IN ? DAYS, DEPENDING ON CLINICAL CONDITION. She will likely need skilled nursing   Myrtis Ser M.D on 05/15/2015 at 1:28 PM  Between 7am to 6pm - Pager - [phone]  After 6pm go to [email or web address] - password [email or web address] [facility]  Office  [phone]  CC: Primary care physician; Loura Pardon, MD   Note: This dictation was prepared with Dragon dictation along with smaller phrase technology. Any transcriptional errors that result from this process are unintentional.

## 2015-05-15 NOTE — Progress Notes (Signed)
[facility]  ROUNDING NOTE   Subjective:   Patient is very sleepy today.   Serum creatinine about the same at 3.55 Urine output 1950 cc last 24 hours Patient was started on IV Lasix infusion on December 20. Good response Currently 2 mg/hr Denies acute shortness of breath   Objective:  Vital signs in last 24 hours:  Temp:  [97.7 F (36.5 C)-98 F (36.7 C)] 97.9 F (36.6 C) (12/23 0916) Pulse Rate:  [99-117] 99 (12/23 0916) Resp:  [18] 18 (12/23 0455) BP: (104-125)/(46-54) 111/46 mmHg (12/23 0916) SpO2:  [94 %-98 %] 98 % (12/23 0916) Weight:  [91.264 kg (201 lb 3.2 oz)] 91.264 kg (201 lb 3.2 oz) (12/23 0455)  Weight change: 0.664 kg (1 lb 7.4 oz) Filed Weights   05/13/15 0500 05/14/15 0500 05/15/15 0455  Weight: 93.3 kg (205 lb 11 oz) 90.6 kg (199 lb 11.8 oz) 91.264 kg (201 lb 3.2 oz)    Intake/Output: I/O last 3 completed shifts: In: 816 [P.O.:120; I.V.:336; Other:310; IV Piggyback:50] Out: 3600 [Urine:3600]   Intake/Output this shift:     Physical Exam: General: NAD  HENT [place] oxygen  Eyes: Anicteric,   Neck: Supple, trachea midline  Lungs:  Normal resp effort no wheezing  Heart: irregular rate and rhythm, A Fib  Abdomen:  Soft, nontender, diatended  Extremities: 1+ dependent and peripheral edema.  Neurologic: Very somnolent today  Skin: No lesions  Access: Right femoral vascath 12/16 Dr. Stevenson Clinch. Day 7  Foley  Basic Metabolic Panel:  Recent Labs Lab 05/11/15 0614 05/11/15 1449 05/12/15 0415 05/13/15 0418 05/14/15 0520 05/15/15 0528  NA 136  --  139 138 141 138  K 3.8  --  4.2 3.3* 3.7 3.8  CL 102  --  103 100* 101 99*  CO2 25  --  29 31 32 34*  GLUCOSE 140*  --  132* 126* 132* 196*  BUN 47*  --  31* 28* 29* 28*  CREATININE 5.38*  --  3.78* 3.36* 3.53* 3.55*  CALCIUM 8.8*  --  8.7* 8.6* 8.6* 8.7*  MG  --   --   --  1.8 1.7 1.6*  PHOS 6.5* 4.8*  --  3.7 4.6 5.5*    Liver Function Tests:  Recent Labs Lab 05/10/15 0158  05/11/15 0614 05/13/15 0418 05/15/15 0528  AST  --   --  24 33  ALT  --   --  13* 16  ALKPHOS  --   --  55 49  BILITOT  --   --  1.2 1.1  PROT  --   --  7.9 8.1  ALBUMIN 2.5* 2.6* 2.5* 2.6*   No results for input(s): LIPASE, AMYLASE in the last 168 hours.  Recent Labs Lab 05/15/15 0528  AMMONIA 47*    CBC:  Recent Labs Lab 05/11/15 0614 05/12/15 0415 05/13/15 0418 05/14/15 0520 05/15/15 0528  WBC 14.4* 12.0* 9.9 9.9 10.9  HGB 8.6* 8.9* 8.7* 8.3* 8.6*  HCT 27.4* 28.0* 26.8* 26.3* 26.8*  MCV 92.5 93.3 93.0 92.3 94.0  PLT 265 205 173 158 160    Cardiac Enzymes:  Recent Labs Lab 05/09/15 1300 05/10/15 0158  CKMB 1.8 2.0  TROPONINI 0.03 0.04*    BNP: Invalid input(s): POCBNP  CBG:  Recent Labs Lab 05/11/15 0903  GLUCAP 115*    Microbiology: Results for orders placed or performed during the hospital encounter of 04/25/2015  Culture, blood (Routine X 2) w Reflex to ID Panel  Status: None   Collection Time: 05/02/2015  2:18 AM  Result Value Ref Range Status   Specimen Description BLOOD LEFT ASSIST CONTROL  Final   Special Requests BOTTLES DRAWN AEROBIC AND ANAEROBIC 5 CC  Final   Culture NO GROWTH 5 DAYS  Final   Report Status 05/13/2015 FINAL  Final  Culture, blood (Routine X 2) w Reflex to ID Panel     Status: None   Collection Time: 05/11/2015  2:18 AM  Result Value Ref Range Status   Specimen Description BLOOD RIGHT ASSIST CONTROL  Final   Special Requests BOTTLES DRAWN AEROBIC AND ANAEROBIC 1 CC  Final   Culture NO GROWTH 5 DAYS  Final   Report Status 05/13/2015 FINAL  Final  Urine culture     Status: None   Collection Time: 05/06/2015  6:05 AM  Result Value Ref Range Status   Specimen Description URINE, RANDOM  Final   Special Requests NONE  Final   Culture 50,000 COLONIES/mL CANDIDA ALBICANS  Final   Report Status 05/11/2015 FINAL  Final    Coagulation Studies: No results for input(s): LABPROT, INR in the last 72 hours.  Urinalysis: No  results for input(s): COLORURINE, LABSPEC, PHURINE, GLUCOSEU, HGBUR, BILIRUBINUR, KETONESUR, PROTEINUR, UROBILINOGEN, NITRITE, LEUKOCYTESUR in the last 72 hours.  Invalid input(s): APPERANCEUR    Imaging: No results found.   Medications:   . furosemide (LASIX) infusion 4 mg/hr (05/14/15 1100)   . antiseptic oral rinse  7 mL Mouth Rinse BID  . clonazePAM  0.5 mg Oral BID  . heparin subcutaneous  5,000 Units Subcutaneous Q12H  . ipratropium-albuterol  3 mL Nebulization Q6H  . lactulose  20 g Oral Daily  . levothyroxine  100 mcg Oral Q0600  . metoprolol tartrate  12.5 mg Oral BID  . pantoprazole  40 mg Oral Daily  . rifaximin  550 mg Oral BID  . sevelamer carbonate  1,600 mg Oral TID WC  . sodium chloride  3 mL Intravenous Q12H   sodium chloride, sodium chloride, acetaminophen, alteplase, heparin, ipratropium-albuterol, lidocaine (PF), lidocaine-prilocaine, metoprolol, ondansetron **OR** ondansetron (ZOFRAN) IV, pentafluoroprop-tetrafluoroeth, traZODone  Assessment/ Plan:  Jean Tucker is a 64 y.o. white female GERD, hyperlipidemia, osteoporosis, rheumatoid arthritis, cirrhosis of the liver secondary to autoimmune process, esophageal varices status post TIPS procedure, who was admitted to [facility] on 05/12/2015 for evaluation of shortness of breath.  1. Acute renal failure:likely ATN after TIPS procedure. Hemodialysis 12/16 due to worsening renal function.   Baseline creatinine of 0.85 from 12/2 Continue Lasix drip for now Electrolytes and Volume status are acceptable No acute indication for Dialysis at present  Concern about serum creatinine not improving despite good urine output   It could be related to relative hypotension with blood pressure staying in the 90-110 range We'll continue to monitor, continue decreased dose of Lasix @ 2 mg/h  2. Liver cirrhosis (autoimmune in etiology) with history of esophogeal varices s/p TIPS at [facility] 04/25/15.  - rifaximin and lactulose for  hepatic encephalopathy  3. Acute resp failure from Pneumonia and Acute pulmonary edema:  - empiric broad spectrum ABx - clinically better today after volume removal with HD - Urine output has improved with IV Lasix infusion  4. Generalized edema See above  5. Somnolence - ? Related to klonopin - d/c klonopin    LOS: 7 Philip Eckersley 12/23/20161:41 PM

## 2015-05-15 NOTE — Plan of Care (Signed)
Problem: Education: Goal: Knowledge of disease or condition will improve Outcome: Progressing Spoke to family about the plan, unsure about dialysis taking it day to day, increase  Drowsiness.   Problem: Coping: Goal: Verbalizations of decreased anxiety will increase Outcome: Not Progressing Unable to assess anxiety due to increasing drowsiness  Problem: Respiratory: Goal: Respiratory status will improve Outcome: Progressing No change pt taking short breaths  Goal: Ability to maintain a clear airway will improve Outcome: Completed/Met Date Met:  05/15/15 Pt able to clear airway Goal: Pain level will decrease Outcome: Progressing No obvious signs of pain

## 2015-05-15 NOTE — Progress Notes (Signed)
Nutrition Follow-up     INTERVENTION:  Meals and snacks: Cater to pt preferences. Discussed liberalizing diet secondary to not eating during admission (8 days) with Dr Volanda Napoleon and prefers to continue low sodium with 1225ml fluid restriction at this time. Host/hostess staff to provide menu to daughter Medical Nutrition Supplement Therapy: continue magic cup Nutrition related medication: May need to consider appetite stimulant if intake does not improve   NUTRITION DIAGNOSIS:   Inadequate oral intake related to acute illness as evidenced by per patient/family report.    GOAL:   Patient will meet greater than or equal to 90% of their needs    MONITOR:    (Energy intake, Electrolyte and renal profile)  REASON FOR ASSESSMENT:   Consult Poor PO  ASSESSMENT:      Pt continues on lasix drip. Last HD treatment 12/16   Current Nutrition: Spoke with dtr at bedside and reports no appetite since admission and prior to admission.  Bites eaten over the past 8 days during admission (per I and O sheet intake 40% or less).  Does not like ensure, full magic cup at bedside.  Pt sleeping during visit.   Gastrointestinal Profile: Last BM: 12/22   Scheduled Medications:  . antiseptic oral rinse  7 mL Mouth Rinse BID  . clonazePAM  0.5 mg Oral BID  . heparin subcutaneous  5,000 Units Subcutaneous Q12H  . ipratropium-albuterol  3 mL Nebulization Q6H  . lactulose  20 g Oral Daily  . levothyroxine  100 mcg Oral Q0600  . metoprolol tartrate  12.5 mg Oral BID  . pantoprazole  40 mg Oral Daily  . piperacillin-tazobactam (ZOSYN)  IV  3.375 g Intravenous Q12H  . rifaximin  550 mg Oral BID  . sevelamer carbonate  1,600 mg Oral TID WC  . sodium chloride  3 mL Intravenous Q12H    Continuous Medications:  . furosemide (LASIX) infusion 4 mg/hr (05/14/15 1100)     Electrolyte/Renal Profile and Glucose Profile:   Recent Labs Lab 05/13/15 0418 05/14/15 0520 05/15/15 0528  NA 138 141 138   K 3.3* 3.7 3.8  CL 100* 101 99*  CO2 31 32 34*  BUN 28* 29* 28*  CREATININE 3.36* 3.53* 3.55*  CALCIUM 8.6* 8.6* 8.7*  MG 1.8 1.7 1.6*  PHOS 3.7 4.6 5.5*  GLUCOSE 126* 132* 196*   Protein Profile:  Recent Labs Lab 05/11/15 0614 05/13/15 0418 05/15/15 0528  ALBUMIN 2.6* 2.5* 2.6*       Weight Trend since Admission: Filed Weights   05/13/15 0500 05/14/15 0500 05/15/15 0455  Weight: 205 lb 11 oz (93.3 kg) 199 lb 11.8 oz (90.6 kg) 201 lb 3.2 oz (91.264 kg)      Diet Order:  Diet 2 gram sodium Room service appropriate?: Yes; Fluid consistency:: Thin; Fluid restriction:: 1200 mL Fluid  Skin:   reviewed   Height:   Ht Readings from Last 1 Encounters:  05/11/15 5\' 4"  (1.626 m)    Weight:   Wt Readings from Last 1 Encounters:  05/15/15 201 lb 3.2 oz (91.264 kg)    Ideal Body Weight:     BMI:  Body mass index is 34.52 kg/(m^2).  Estimated Nutritional Needs:   Kcal:  BEE 1013 kcals (IF 1.0-1.3, AF 1.3) 1316-1711 kcals/d.   Protein:  (1.0-1.2 g/kg) 50-60 g/d  Fluid:  (106ml + urine output)  EDUCATION NEEDS:   No education needs identified at this time  [place]. Zenia Resides, [place], [place] (pager)  Weekend/On-Call pager [phone])

## 2015-05-15 NOTE — Progress Notes (Signed)
Pharmacy Consult for Electrolyte Monitoring and Replacement  Allergies  Allergen Reactions  . Codeine Anaphylaxis  . Arava [Leflunomide] Other (See Comments)    Pt states that this medication causes liver damage.   . Atorvastatin Other (See Comments)    Pt states that this medication causes liver damage.   . Humira [Adalimumab] Other (See Comments)    Pt states that this medication causes liver damage.   . Methotrexate Derivatives Other (See Comments)    Pt states that this medication causes liver damage.   . Simvastatin Other (See Comments)    Pt states that this medication causes liver damage and joint pain.     Patient Measurements: Height: 5\' 4"  (162.6 cm) Weight: 201 lb 3.2 oz (91.264 kg) IBW/kg (Calculated) : 54.7  Vital Signs: Temp: 98 F (36.7 C) (12/23 0455) Temp Source: Oral (12/23 0455) BP: 125/54 mmHg (12/23 0455) Pulse Rate: 107 (12/23 0455) Intake/Output from previous day: 12/22 0701 - 12/23 0700 In: 84 [I.V.:84] Out: 1950 [Urine:1950] Intake/Output from this shift:    Labs:  Recent Labs  05/13/15 0418 05/14/15 0520 05/15/15 0528  WBC 9.9 9.9 10.9  HGB 8.7* 8.3* 8.6*  HCT 26.8* 26.3* 26.8*  PLT 173 158 160     Recent Labs  05/13/15 0418 05/14/15 0520 05/15/15 0528  NA 138 141 138  K 3.3* 3.7 3.8  CL 100* 101 99*  CO2 31 32 34*  GLUCOSE 126* 132* 196*  BUN 28* 29* 28*  CREATININE 3.36* 3.53* 3.55*  CALCIUM 8.6* 8.6* 8.7*  MG 1.8 1.7 1.6*  PHOS 3.7 4.6 5.5*  PROT 7.9  --  8.1  ALBUMIN 2.5*  --  2.6*  AST 24  --  33  ALT 13*  --  16  ALKPHOS 55  --  49  BILITOT 1.2  --  1.1   Estimated Creatinine Clearance: 18 mL/min (by C-G formula based on Cr of 3.55).   No results for input(s): GLUCAP in the last 72 hours.  Medical History: Past Medical History  Diagnosis Date  . Allergy   . GERD (gastroesophageal reflux disease)   . CAD (coronary artery disease)   . Hyperlipidemia   . Thyroid disease   . Osteoporosis   . Rheumatoid  arthritis ([place])   . Esophageal varices ([place]) 09/22/2014    April, 2016-grade 2-3 varices  . Hypothyroidism   . Cirrhosis ([place])     Medications:  Scheduled:  . antiseptic oral rinse  7 mL Mouth Rinse BID  . clonazePAM  0.5 mg Oral BID  . heparin subcutaneous  5,000 Units Subcutaneous Q12H  . ipratropium-albuterol  3 mL Nebulization Q6H  . lactulose  20 g Oral Daily  . levothyroxine  100 mcg Oral Q0600  . magnesium sulfate 1 - 4 g bolus IVPB  4 g Intravenous Once  . metoprolol tartrate  12.5 mg Oral BID  . pantoprazole  40 mg Oral Daily  . piperacillin-tazobactam (ZOSYN)  IV  3.375 g Intravenous Q12H  . rifaximin  550 mg Oral BID  . sevelamer carbonate  1,600 mg Oral TID WC  . sodium chloride  3 mL Intravenous Q12H   Infusions:  . furosemide (LASIX) infusion 4 mg/hr (05/14/15 1100)    Assessment: Pharmacy consulted to assist in managing electrolytes in this 64 y/o F with ARF currently requiring HD.  K= 3.3  Plan:  Phos 5.5, magnesium 1.6. Ordered magnesium 4 gm IV x 1 and will recheck with AM labs.  Laural Benes,  Pharm.D., BCPS Clinical Pharmacist 05/15/2015,6:35 AM

## 2015-05-15 NOTE — NC FL2 (Signed)
[place] LEVEL OF CARE SCREENING TOOL     IDENTIFICATION  Patient Name: Jean Tucker Birthdate: Dec 30, 1950 Sex: female Admission Date (Current Location): 05/23/2015  [place] and [place] Number:  Engineering geologist and Address:  [facility], [address] [place], [place] [address]      Provider Number: [id number]  Attending Physician Name and Address:  Aldean Jewett, MD  Relative Name and Phone Number:       Current Level of Care: Hospital Recommended Level of Care: [place] Prior Approval Number:    Date Approved/Denied:   PASRR Number:    Discharge Plan: Home    Current Diagnoses: Patient Active Problem List   Diagnosis Date Noted  . HCAP (healthcare-associated pneumonia) 04/27/2015  . Acute renal failure (ARF) ([place])   . Upper GI bleeding 04/23/2015  . Autoimmune hepatitis ([place]) 12/17/2014  . GI bleed 12/01/2014  . Cirrhosis ([place])   . Hepatic cirrhosis ([place]) 09/22/2014  . Esophageal varices ([place]) 09/22/2014  . Heme positive stool 07/31/2014  . Acute blood loss anemia 07/31/2014  . History of peptic ulcer disease 07/25/2014  . Chronic rheumatic arthritis ([place]) 07/09/2014  . Hyponatremia 07/08/2014  . Elevated transaminase level 07/08/2014  . Anemia 07/08/2014  . Sinus congestion 07/08/2014  . Hyperglycemia 06/20/2014  . Cough 06/20/2014  . Candidal intertrigo 12/25/2013  . Vitamin D deficiency disease 06/24/2013  . Obesity 06/24/2013  . Essential hypertension, benign 03/24/2013  . Arthralgia 04/04/2012  . Knee pain 04/04/2012  . Hand pain 04/04/2012  . Routine general medical examination at a health care facility 10/13/2011  . CERVICAL RADICULOPATHY, RIGHT 01/28/2010  . 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 Former smoker 01/02/2008  . Hypothyroidism 01/01/2008  . HYPERLIPIDEMIA 01/01/2008  . Coronary atherosclerosis 01/01/2008  . ALLERGIC RHINITIS 01/01/2008  . GERD  01/01/2008  . FIBROCYSTIC BREAST DISEASE 01/01/2008  . OSTEOPENIA 01/01/2008  . INSOMNIA 01/01/2008  . CONSTIPATION, HX OF 01/01/2008    Orientation RESPIRATION BLADDER Height & Weight    Self, Time, Situation, Place  Normal Continent 5\' 4"  (162.6 cm) 201 lbs.  BEHAVIORAL SYMPTOMS/MOOD NEUROLOGICAL BOWEL NUTRITION STATUS      Continent Diet (2 gram Na, Thin Liquids, Fluid Restriction)  AMBULATORY STATUS COMMUNICATION OF NEEDS Skin   Limited Assist Verbally Normal                       Personal Care Assistance Level of Assistance  Bathing, Dressing, Total care, Feeding Bathing Assistance: Limited assistance Feeding assistance: Limited assistance Dressing Assistance: Limited assistance     Functional Limitations Info  Sight, Hearing, Speech Sight Info: Adequate Hearing Info: Adequate Speech Info: Adequate    SPECIAL CARE FACTORS FREQUENCY  PT (By licensed PT)                    Contractures      Additional Factors Info  Code Status, Allergies               Current Medications (05/15/2015):  This is the current hospital active medication list Current Facility-Administered Medications  Medication Dose Route Frequency Provider Last Rate Last Dose  . 0.9 %  sodium chloride infusion  100 mL Intravenous PRN Munsoor Lateef, MD      . 0.9 %  sodium chloride infusion  100 mL Intravenous PRN Munsoor Lateef, MD      . acetaminophen (TYLENOL) tablet 650 mg  650 mg  Oral Q6H PRN Flora Lipps, MD   650 mg at 05/15/15 [phone]  . alteplase (CATHFLO ACTIVASE) injection 2 mg  2 mg Intracatheter Once PRN Munsoor Lateef, MD      . antiseptic oral rinse (CPC / CETYLPYRIDINIUM CHLORIDE 0.05%) solution 7 mL  7 mL Mouth Rinse BID Vishal Mungal, MD   7 mL at 05/15/15 0949  . furosemide (LASIX) 250 mg in dextrose 5 % 250 mL (1 mg/mL) infusion  2 mg/hr Intravenous Continuous Harmeet Singh, MD 4 mL/hr at 05/14/15 1100 4 mg/hr at 05/14/15 1100  . heparin injection 1,000 Units  1,000  Units Dialysis PRN Munsoor Lateef, MD      . heparin injection 5,000 Units  5,000 Units Subcutaneous Q12H Vilinda Boehringer, MD   5,000 Units at 05/15/15 0943  . ipratropium-albuterol (DUONEB) 0.5-2.5 (3) MG/3ML nebulizer solution 3 mL  3 mL Nebulization Q3H PRN Wilhelmina Mcardle, MD      . ipratropium-albuterol (DUONEB) 0.5-2.5 (3) MG/3ML nebulizer solution 3 mL  3 mL Nebulization Q6H Wilhelmina Mcardle, MD   3 mL at 05/15/15 1338  . lactulose (CHRONULAC) 10 GM/15ML solution 20 g  20 g Oral Daily Harrie Foreman, MD   20 g at 05/15/15 1017  . levothyroxine (SYNTHROID, LEVOTHROID) tablet 100 mcg  100 mcg Oral Q0600 Aldean Jewett, MD   100 mcg at 05/15/15 [phone]  . lidocaine (PF) (XYLOCAINE) 1 % injection 5 mL  5 mL Intradermal PRN Munsoor Lateef, MD      . lidocaine-prilocaine (EMLA) cream 1 application  1 application Topical PRN Munsoor Lateef, MD      . metoprolol (LOPRESSOR) injection 2.5-5 mg  2.5-5 mg Intravenous Q3H PRN Wilhelmina Mcardle, MD      . metoprolol tartrate (LOPRESSOR) tablet 12.5 mg  12.5 mg Oral BID Harrie Foreman, MD   12.5 mg at 05/15/15 [id number]  . ondansetron (ZOFRAN) tablet 4 mg  4 mg Oral Q6H PRN Harrie Foreman, MD       Or  . ondansetron [facility]) injection 4 mg  4 mg Intravenous Q6H PRN Harrie Foreman, MD   4 mg at 05/14/15 1837  . pantoprazole (PROTONIX) EC tablet 40 mg  40 mg Oral Daily Harrie Foreman, MD   40 mg at 05/15/15 [id number]  . pentafluoroprop-tetrafluoroeth (GEBAUERS) aerosol 1 application  1 application Topical PRN Munsoor Lateef, MD      . rifaximin (XIFAXAN) tablet 550 mg  550 mg Oral BID Harrie Foreman, MD   550 mg at 05/15/15 [id number]  . sevelamer carbonate (RENVELA) tablet 1,600 mg  1,600 mg Oral TID WC Harrie Foreman, MD   1,600 mg at 05/15/15 1241  . sodium chloride 0.9 % injection 3 mL  3 mL Intravenous Q12H Harrie Foreman, MD   3 mL at 05/15/15 1017  . traZODone (DESYREL) tablet 50 mg  50 mg Oral QHS PRN Wilhelmina Mcardle, MD   50 mg at 05/14/15 2255      Discharge Medications: Please see discharge summary for a list of discharge medications.  Relevant Imaging Results:  Relevant Lab Results:   Additional Information SSN: [id number], Contact iso for MRSA  Darden Dates, LCSW

## 2015-05-16 ENCOUNTER — Inpatient Hospital Stay: Payer: BLUE CROSS/BLUE SHIELD

## 2015-05-16 DIAGNOSIS — J9622 Acute and chronic respiratory failure with hypercapnia: Secondary | ICD-10-CM

## 2015-05-16 DIAGNOSIS — I959 Hypotension, unspecified: Secondary | ICD-10-CM

## 2015-05-16 DIAGNOSIS — J9621 Acute and chronic respiratory failure with hypoxia: Secondary | ICD-10-CM

## 2015-05-16 LAB — CBC
HCT: 28 % — ABNORMAL LOW (ref 35.0–47.0)
HEMOGLOBIN: 8.4 g/dL — AB (ref 12.0–16.0)
MCH: 29.2 pg (ref 26.0–34.0)
MCHC: 30.2 g/dL — ABNORMAL LOW (ref 32.0–36.0)
MCV: 96.9 fL (ref 80.0–100.0)
PLATELETS: 189 10*3/uL (ref 150–440)
RBC: 2.89 MIL/uL — AB (ref 3.80–5.20)
RDW: 20.3 % — ABNORMAL HIGH (ref 11.5–14.5)
WBC: 12.9 10*3/uL — AB (ref 3.6–11.0)

## 2015-05-16 LAB — BLOOD GAS, ARTERIAL
ACID-BASE EXCESS: 1.3 mmol/L (ref 0.0–3.0)
ACID-BASE EXCESS: 3.5 mmol/L — AB (ref 0.0–3.0)
ALLENS TEST (PASS/FAIL): POSITIVE — AB
ALLENS TEST (PASS/FAIL): POSITIVE — AB
Bicarbonate: 27.1 mEq/L (ref 21.0–28.0)
Bicarbonate: 30.1 mEq/L — ABNORMAL HIGH (ref 21.0–28.0)
FIO2: 0.32
FIO2: 1
FIO2: 0.4
MECHVT: 440 mL
O2 Saturation: 94.5 %
O2 Saturation: 93.6 %
PCO2 ART: 48 mmHg (ref 32.0–48.0)
PEEP: 5 cmH2O
PH ART: 7.36 (ref 7.350–7.450)
PH ART: 7.33 — AB (ref 7.350–7.450)
PO2 ART: 51 mmHg — AB (ref 83.0–108.0)
Patient temperature: 37
Patient temperature: 37
Patient temperature: 37
RATE: 16 resp/min
pCO2 arterial: 147 mmHg (ref 32.0–48.0)
pCO2 arterial: 57 mmHg — ABNORMAL HIGH (ref 32.0–48.0)
pH, Arterial: 7.03 — CL (ref 7.350–7.450)
pO2, Arterial: 76 mmHg — ABNORMAL LOW (ref 83.0–108.0)
pO2, Arterial: 74 mmHg — ABNORMAL LOW (ref 83.0–108.0)

## 2015-05-16 LAB — COMPREHENSIVE METABOLIC PANEL
ALBUMIN: 2.7 g/dL — AB (ref 3.5–5.0)
ALK PHOS: 54 U/L (ref 38–126)
ALT: 19 U/L (ref 14–54)
AST: 36 U/L (ref 15–41)
Anion gap: 6 (ref 5–15)
BUN: 28 mg/dL — ABNORMAL HIGH (ref 6–20)
CHLORIDE: 97 mmol/L — AB (ref 101–111)
CO2: 35 mmol/L — AB (ref 22–32)
CREATININE: 3.65 mg/dL — AB (ref 0.44–1.00)
Calcium: 8.9 mg/dL (ref 8.9–10.3)
GFR calc non Af Amer: 12 mL/min — ABNORMAL LOW (ref >60)
GFR, EST AFRICAN AMERICAN: 14 mL/min — AB (ref >60)
GLUCOSE: 186 mg/dL — AB (ref 65–99)
Potassium: 4.9 mmol/L (ref 3.5–5.1)
SODIUM: 138 mmol/L (ref 135–145)
Total Bilirubin: 1.4 mg/dL — ABNORMAL HIGH (ref 0.3–1.2)
Total Protein: 8.8 g/dL — ABNORMAL HIGH (ref 6.5–8.1)

## 2015-05-16 LAB — MAGNESIUM: MAGNESIUM: 2.7 mg/dL — AB (ref 1.7–2.4)

## 2015-05-16 LAB — GLUCOSE, CAPILLARY
Glucose-Capillary: 162 mg/dL — ABNORMAL HIGH (ref 65–99)
Glucose-Capillary: 127 mg/dL — ABNORMAL HIGH (ref 65–99)

## 2015-05-16 LAB — TRIGLYCERIDES: TRIGLYCERIDES: 65 mg/dL (ref <150)

## 2015-05-16 LAB — PHOSPHORUS: PHOSPHORUS: 8.2 mg/dL — AB (ref 2.5–4.6)

## 2015-05-16 MED ORDER — ETOMIDATE 2 MG/ML IV SOLN
10.0000 mg | Freq: Once | INTRAVENOUS | Status: AC
  Administered 2015-05-16: 10 mg via INTRAVENOUS

## 2015-05-16 MED ORDER — LACTULOSE 10 GM/15ML PO SOLN
20.0000 g | Freq: Every day | ORAL | Status: DC
Start: 2015-05-16 — End: 2015-05-16

## 2015-05-16 MED ORDER — PROPOFOL 1000 MG/100ML IV EMUL: 5.0000 ug/kg/min | INTRAVENOUS | Status: DC

## 2015-05-16 MED ORDER — LACTULOSE 10 GM/15ML PO SOLN
20.0000 g | Freq: Two times a day (BID) | ORAL | Status: DC
  Administered 2015-05-16 – 2015-05-18 (×5): 20 g
  Filled 2015-05-16 (×5): qty 30

## 2015-05-16 MED ORDER — CHLORHEXIDINE GLUCONATE 0.12% ORAL RINSE (MEDLINE KIT)
15.0000 mL | Freq: Two times a day (BID) | OROMUCOSAL | Status: DC
  Administered 2015-05-16 – 2015-05-19 (×8): 15 mL via OROMUCOSAL

## 2015-05-16 MED ORDER — ACETAMINOPHEN 325 MG PO TABS: 650.0000 mg | ORAL_TABLET | Freq: Four times a day (QID) | ORAL | Status: DC | PRN

## 2015-05-16 MED ORDER — LORAZEPAM 2 MG/ML IJ SOLN
INTRAMUSCULAR | Status: AC
  Administered 2015-05-16: 2 mg via INTRAVENOUS
  Filled 2015-05-16: qty 1

## 2015-05-16 MED ORDER — NOREPINEPHRINE BITARTRATE 1 MG/ML IV SOLN
0.0000 ug/min | INTRAVENOUS | Status: DC
  Administered 2015-05-16: 4 ug/min via INTRAVENOUS
  Administered 2015-05-16: 15 ug/min via INTRAVENOUS
  Administered 2015-05-16: 4 ug/min via INTRAVENOUS
  Filled 2015-05-16 (×5): qty 4

## 2015-05-16 MED ORDER — ANTISEPTIC ORAL RINSE SOLUTION (CORINZ)
7.0000 mL | Freq: Four times a day (QID) | OROMUCOSAL | Status: DC
  Administered 2015-05-16 – 2015-05-20 (×19): 7 mL via OROMUCOSAL
  Filled 2015-05-16 (×6): qty 7

## 2015-05-16 MED ORDER — RIFAXIMIN 550 MG PO TABS
550.0000 mg | ORAL_TABLET | Freq: Two times a day (BID) | ORAL | Status: DC
  Administered 2015-05-16 – 2015-05-19 (×8): 550 mg
  Filled 2015-05-16 (×8): qty 1

## 2015-05-16 MED ORDER — LEVOTHYROXINE SODIUM 100 MCG PO TABS
100.0000 ug | ORAL_TABLET | Freq: Every day | ORAL | Status: DC
  Administered 2015-05-17 – 2015-05-19 (×3): 100 ug
  Filled 2015-05-16 (×3): qty 1

## 2015-05-16 MED ORDER — PROPOFOL 1000 MG/100ML IV EMUL
0.0000 ug/kg/min | INTRAVENOUS | Status: AC
  Administered 2015-05-16: 35 ug/kg/min via INTRAVENOUS
  Administered 2015-05-16 (×3): 30 ug/kg/min via INTRAVENOUS
  Administered 2015-05-17: 15 ug/kg/min via INTRAVENOUS
  Administered 2015-05-17 (×2): 20 ug/kg/min via INTRAVENOUS
  Administered 2015-05-18: 5 ug/kg/min via INTRAVENOUS
  Administered 2015-05-18: 10 ug/kg/min via INTRAVENOUS
  Administered 2015-05-19: 15 ug/kg/min via INTRAVENOUS
  Filled 2015-05-16 (×8): qty 100

## 2015-05-16 MED ORDER — PANTOPRAZOLE SODIUM 40 MG PO PACK
40.0000 mg | PACK | Freq: Every day | ORAL | Status: DC
  Administered 2015-05-16 – 2015-05-19 (×4): 40 mg
  Filled 2015-05-16 (×4): qty 20

## 2015-05-16 MED ORDER — SODIUM CHLORIDE 0.9 % IV SOLN
INTRAVENOUS | Status: DC
  Administered 2015-05-16 – 2015-05-18 (×2): via INTRAVENOUS

## 2015-05-16 MED ORDER — IPRATROPIUM-ALBUTEROL 0.5-2.5 (3) MG/3ML IN SOLN: 3.0000 mL | Freq: Four times a day (QID) | RESPIRATORY_TRACT | Status: DC

## 2015-05-16 MED ORDER — NOREPINEPHRINE BITARTRATE 1 MG/ML IV SOLN
0.0000 ug/min | INTRAVENOUS | Status: DC
  Administered 2015-05-16 – 2015-05-17 (×2): 13 ug/min via INTRAVENOUS
  Filled 2015-05-16: qty 16

## 2015-05-16 MED ORDER — LORAZEPAM 2 MG/ML IJ SOLN
2.0000 mg | Freq: Once | INTRAMUSCULAR | Status: AC
Start: 2015-05-16 — End: 2015-05-16
  Administered 2015-05-16: 2 mg via INTRAVENOUS

## 2015-05-16 MED ORDER — ALBUTEROL SULFATE (2.5 MG/3ML) 0.083% IN NEBU: 2.5000 mg | INHALATION_SOLUTION | RESPIRATORY_TRACT | Status: DC

## 2015-05-16 MED ORDER — PROPOFOL 1000 MG/100ML IV EMUL
INTRAVENOUS | Status: AC
  Administered 2015-05-16: 30 ug/kg/min via INTRAVENOUS
  Filled 2015-05-16: qty 100

## 2015-05-16 MED ORDER — SODIUM CHLORIDE 0.9 % IV BOLUS (SEPSIS)
1000.0000 mL | Freq: Once | INTRAVENOUS | Status: AC
  Administered 2015-05-16: 1000 mL via INTRAVENOUS

## 2015-05-16 MED ORDER — PANTOPRAZOLE SODIUM 40 MG IV SOLR: 40.0000 mg | Freq: Every day | INTRAVENOUS | Status: DC

## 2015-05-16 NOTE — Progress Notes (Signed)
[facility] [place] NAME: Jean Tucker    MR#:  [id number]  DATE OF BIRTH:  09/09/1950  SUBJECTIVE: 64 year old female patient with history of fall autoimmune liver disease with cirrhosis with recent history of for variceal bleed status post a TIPS procedure at [facility]. Patient signed out AMA from [place] yesterday. Patient noted to have acute renal failure developed after TIPS procedure. Also has pneumonia. At this time patient is admitted for healthcare associated pneumonia, acute renal failure.   CHIEF COMPLAINT:   Chief Complaint  Patient presents with  . Shortness of Breath   Rapid response this morning, non responsive. Now intubated. Hypercapnic respiratory failure  REVIEW OF SYSTEMS:   Review of Systems  Constitutional: Positive for malaise/fatigue. Negative for fever.  Respiratory: Positive for shortness of breath and wheezing.   Cardiovascular: Positive for chest pain and palpitations.  Gastrointestinal: Negative for nausea, vomiting and abdominal pain.  Genitourinary: Negative for dysuria.    DRUG ALLERGIES:   Allergies  Allergen Reactions  . Codeine Anaphylaxis  . Arava [Leflunomide] Other (See Comments)    Pt states that this medication causes liver damage.   . Atorvastatin Other (See Comments)    Pt states that this medication causes liver damage.   . Humira [Adalimumab] Other (See Comments)    Pt states that this medication causes liver damage.   . Methotrexate Derivatives Other (See Comments)    Pt states that this medication causes liver damage.   . Simvastatin Other (See Comments)    Pt states that this medication causes liver damage and joint pain.     VITALS:  Blood pressure 100/59, pulse 65, temperature 97.7 F (36.5 C), temperature source Oral, resp. rate 20, height 5\' 4"  (1.626 m), weight 91.264 kg (201 lb 3.2 oz), SpO2 97 %.  PHYSICAL EXAMINATION:  GENERAL:  64  y.o.-year-old patient  Critically ill LUNGS: intubated, coarse ventilated breath sounds CARDIOVASCULAR: S1, S2 normal. No murmurs, rubs, or gallops. Tachycardic, irregular ABDOMEN: Soft, nontender, nondistended. Bowel sounds present. No organomegaly or mass.  EXTREMITIES: No cyanosis, or clubbing. +1 bilateral pedal edema NEUROLOGIC: sedated PSYCHIATRIC: sedated SKIN: No obvious rash, lesion, or ulcer.   LABORATORY PANEL:   CBC  Recent Labs Lab 05/16/15 0527  WBC 12.9*  HGB 8.4*  HCT 28.0*  PLT 189   ------------------------------------------------------------------------------------------------------------------  Chemistries   Recent Labs Lab 05/16/15 0527  NA 138  K 4.9  CL 97*  CO2 35*  GLUCOSE 186*  BUN 28*  CREATININE 3.65*  CALCIUM 8.9  MG 2.7*  AST 36  ALT 19  ALKPHOS 54  BILITOT 1.4*   ------------------------------------------------------------------------------------------------------------------  Cardiac Enzymes  Recent Labs Lab 05/10/15 0158  TROPONINI 0.04*   ------------------------------------------------------------------------------------------------------------------  RADIOLOGY:  Dg Abd 1 View  05/16/2015  CLINICAL DATA:  NG tube placement assessment EXAM: ABDOMEN - 1 VIEW COMPARISON:  None. FINDINGS: Nasogastric tube is identified with distal tip in the mid stomach. There is relative paucity of bowel gas, nonspecific. A stent is identified in the right upper quadrant. IMPRESSION: Nasogastric tube is identified with distal tip in the mid stomach. Electronically Signed   By: Abelardo Diesel M.D.   On: 05/16/2015 07:19   Dg Chest Port 1 View  05/16/2015  CLINICAL DATA:  NEW INTUBATION.: CLINICAL DATA:  NEW INTUBATION. New intubation EXAM: PORTABLE CHEST 1 VIEW COMPARISON:  05/15/2015 FINDINGS: Endotracheal tube is positioned 20 mm from carina. Stable enlarged cardiac silhouette.  There is improvement in aeration the lung bases with decreased  pleural effusion. There is bilateral upper lobe pulmonary edema/venous congestion similar prior. No pneumothorax. IMPRESSION: 1. Endotracheal tube in good position. 2. Improved aeration to the lung bases. 3. Persistent cardiomegaly and bilateral upper lobe airspace disease suggesting edema. Electronically Signed   By: Suzy Bouchard M.D.   On: 05/16/2015 07:20   Dg Chest Port 1 View  05/15/2015  CLINICAL DATA:  Chest congestion and cough. The patient was difficult to arouse today. EXAM: PORTABLE CHEST 1 VIEW COMPARISON:  Single view of the chest 05/13/2015 and 05/12/2015. FINDINGS: Marked cardiomegaly is again seen. There has been interval increase in a moderate right pleural effusion. Left pleural effusion is unchanged. Bilateral upper lobe airspace disease identified. Aeration in the left mid chest appears somewhat improved. IMPRESSION: Bilateral pleural effusions, markedly increased on the right. Cardiomegaly and bilateral airspace disease compatible with pulmonary edema and/or pneumonia. Aeration appears somewhat improved in the left mid lung zone. Electronically Signed   By: Inge Rise M.D.   On: 05/15/2015 14:07    EKG:   Orders placed or performed during the hospital encounter of 05/23/2015  . EKG 12-Lead  . EKG 12-Lead  . ED EKG  . ED EKG  . EKG 12-Lead  . EKG 12-Lead    ASSESSMENT AND PLAN:   1. Acute hypercarbic respiratory failure: - intubated, PCCM following - CXR with edema, effusions  2. Healthcare associated pneumonia: completed course yesterday - Cultured data negative  #3 Acute renal failure due to ATN  - Appreciate nephrology following - Received HD 12/17, 12/19, 12/20 - has been on lasix drip with good urine output, but continued poor renal function - would benefit from HD today  #3 cirrhosis of the liver with variceal bleed status post banding and tips.  - due to autoimmune hepatitis  - transferred from [facility] to [facility] on 12/2 and had TIPS on 12/3 - continue  rifaximin and lactulose for encephalopathy - continue to monitor hepatic function  #4. Hypothyroidism continue Synthroid.  5 history of rheumatoid arthritis: Intolerant to methotrexate, Humira,ARava  6. Atrial fibrillation: Rate is controlled - Appreciate cardiology consultation. Continue metoprolol. No anticoagulation at this time.  7. Hypotension - receiving fluid bolus now, will start levophed with goal map >65  CODE STATUS: full. Will need to revisit goals of care with family today.  All the records are reviewed and case discussed with Care Management/Social Workerr. Management plans discussed with the patient, family and they are in agreement.  CODE STATUS: full  TOTAL TIME TAKING CARE OF THIS PATIENT 35 minutes.   POSSIBLE D/C IN ? DAYS, DEPENDING ON CLINICAL CONDITION.   Myrtis Ser M.D on 05/16/2015 at 8:14 AM  Between 7am to 6pm - Pager - [phone]  After 6pm go to [email or web address] - password [email or web address] [place] [facility]  Office  [phone]  CC: Primary care physician; Loura Pardon, MD   Note: This dictation was prepared with Dragon dictation along with smaller phrase technology. Any transcriptional errors that result from this process are unintentional.

## 2015-05-16 NOTE — Progress Notes (Signed)
64 year old female patient with history of cirrhosis of liver, hypothyroidism, atrial fibrillation, rheumatoid arthritis under hospitalist service for hypoxia, healthcare associated pneumonia end-stage renal disease on dialysis. Patient became unresponsive this morning around 6:15 AM. Patient was put on oxygen via nonrebreather ABG was checked PCO2 was 128 patient was moved to ICU. Patient electively intubated and put on ventilator and started on IV propofol drip. Blood pressure was low patient was started on IV fluids. Intensivist consultation done for respiratory failure. Patient's family was informed regarding medical condition and treatment plan.

## 2015-05-16 NOTE — Progress Notes (Signed)
PULM/CCM NOTE   MAJOR EVENTS/TEST RESULTS: 12/16 Admitted to ICU /SDU with dyspnea, dx of PNA, AKI. Intermittent BiPAP 12/18 transferred out of ICU 12/19 Pt transferred back to ICU/SDU on BiPAP. Resp status improved after HD. Severe delirium after Ambien 12/19 PM 12/22 Back to baseline mentation. PCCM signed off.  12/23 transferred out of ICU /SDU 12/24 returned to ICU with severe hypercarbic respiratory failure requiring intubation   INDWELLING DEVICES:: R fem HD cath 12/17 >>  ETT 12/24 >>   MICRO DATA: Blood 12/16 >> NEG Urine 12/16 >> 50k colonies candida  ANTIMICROBIALS:    SUBJ: RASS -2, not F/C. On propofol. On low dose norepinephrine  Filed Vitals:   05/16/15 0750 05/16/15 0800 05/16/15 0900 05/16/15 1000  BP:  63/37 80/48 112/66  Pulse:  58 65 71  Temp:   94 F (34.4 C)   TempSrc:   Other (Comment)   Resp:  21 16 16   Height:      Weight:      SpO2: 100% 99% 100% 99%   Intubated, sedated HEENT WNL JVP not visualized No wheezes RRR s M NABS, soft, NT No LE edema  BMP Latest Ref Rng 05/16/2015 05/15/2015 05/14/2015  Glucose 65 - 99 mg/dL 186(H) 196(H) 132(H)  BUN 6 - 20 mg/dL 28(H) 28(H) 29(H)  Creatinine 0.44 - 1.00 mg/dL 3.65(H) 3.55(H) 3.53(H)  Sodium 135 - 145 mmol/L 138 138 141  Potassium 3.5 - 5.1 mmol/L 4.9 3.8 3.7  Chloride 101 - 111 mmol/L 97(L) 99(L) 101  CO2 22 - 32 mmol/L 35(H) 34(H) 32  Calcium 8.9 - 10.3 mg/dL 8.9 8.7(L) 8.6(L)    CBC Latest Ref Rng 05/16/2015 05/15/2015 05/14/2015  WBC 3.6 - 11.0 K/uL 12.9(H) 10.9 9.9  Hemoglobin 12.0 - 16.0 g/dL 8.4(L) 8.6(L) 8.3(L)  Hematocrit 35.0 - 47.0 % 28.0(L) 26.8(L) 26.3(L)  Platelets 150 - 440 K/uL 189 160 158    CXR: NSC patchy bilateral infiltrates with upper lobe predominance  IMPRESSION: 1) Recurrent acute on chronic hypercarbic and  hypoxic respiratory failure with bilateral pulmonary infiltrates   She seems to have impaired ventilatory drive. Etiology unclear 2) PAF > NSR 3)  Hypotension 4) AKI on CKD. Borderline oliguria but responds to diuresis 5) Autoimmune hepatitis/cirrhosis - was on transplant list prior to this illness 6) Anemia of critical illness 7) Toxic-metabolic encephalopathy - hypercarbic 8) hypothyroidism - recheck TSH 12/25  PLAN/REC: Cont full vent support - settings reviewed and/or adjusted Cont vent bundle Daily SBT if/when meets criteria Monitor BMET intermittently Monitor I/Os Correct electrolytes as indicated Monitor temp, WBC count Micro and abx as above DVT px: SCDs Monitor CBC intermittently Transfuse per usual guidelines   Family updated @ bedside. I conveyed a guarded prognosis and asked that we begin to consider goals of care  CCM Time - 35 mins  Merton Border, MD PCCM service Mobile [phone] Pager [phone]

## 2015-05-16 NOTE — Progress Notes (Signed)
Pharmacy Consult for Electrolyte Monitoring and Replacement  Allergies  Allergen Reactions  . Codeine Anaphylaxis  . Arava [Leflunomide] Other (See Comments)    Pt states that this medication causes liver damage.   . Atorvastatin Other (See Comments)    Pt states that this medication causes liver damage.   . Humira [Adalimumab] Other (See Comments)    Pt states that this medication causes liver damage.   . Methotrexate Derivatives Other (See Comments)    Pt states that this medication causes liver damage.   . Simvastatin Other (See Comments)    Pt states that this medication causes liver damage and joint pain.     Patient Measurements: Height: 5\' 4"  (162.6 cm) Weight: 201 lb 3.2 oz (91.264 kg) IBW/kg (Calculated) : 54.7  Vital Signs: BP: 95/45 mmHg (12/24 0552) Pulse Rate: 63 (12/24 0552) Intake/Output from previous day: 12/23 0701 - 12/24 0700 In: 900  Out: 400 [Urine:400] Intake/Output from this shift: Total I/O In: -  Out: 400 [Urine:400]  Labs:  Recent Labs  05/14/15 0520 05/15/15 0528 05/16/15 0527  WBC 9.9 10.9 12.9*  HGB 8.3* 8.6* 8.4*  HCT 26.3* 26.8* 28.0*  PLT 158 160 189     Recent Labs  05/14/15 0520 05/15/15 0528 05/16/15 0527  NA 141 138 138  K 3.7 3.8 4.9  CL 101 99* 97*  CO2 32 34* 35*  GLUCOSE 132* 196* 186*  BUN 29* 28* 28*  CREATININE 3.53* 3.55* 3.65*  CALCIUM 8.6* 8.7* 8.9  MG 1.7 1.6* 2.7*  PHOS 4.6 5.5* 8.2*  PROT  --  8.1 8.8*  ALBUMIN  --  2.6* 2.7*  AST  --  33 36  ALT  --  16 19  ALKPHOS  --  49 54  BILITOT  --  1.1 1.4*   Estimated Creatinine Clearance: 17.5 mL/min (by C-G formula based on Cr of 3.65).    Recent Labs  05/16/15 0551  GLUCAP 162*    Medical History: Past Medical History  Diagnosis Date  . Allergy   . GERD (gastroesophageal reflux disease)   . CAD (coronary artery disease)   . Hyperlipidemia   . Thyroid disease   . Osteoporosis   . Rheumatoid arthritis ([place])   . Esophageal varices ([place])  09/22/2014    April, 2016-grade 2-3 varices  . Hypothyroidism   . Cirrhosis ([place])     Medications:  Scheduled:  . antiseptic oral rinse  7 mL Mouth Rinse BID  . heparin subcutaneous  5,000 Units Subcutaneous Q12H  . ipratropium-albuterol  3 mL Nebulization Q6H  . lactulose  20 g Oral Daily  . levothyroxine  100 mcg Oral Q0600  . metoprolol tartrate  12.5 mg Oral BID  . pantoprazole  40 mg Oral Daily  . propofol      . rifaximin  550 mg Oral BID  . sevelamer carbonate  1,600 mg Oral TID WC  . sodium chloride  3 mL Intravenous Q12H   Infusions:  . sodium chloride    . furosemide (LASIX) infusion 4 mg/hr (05/14/15 1100)    Assessment: Pharmacy consulted to assist in managing electrolytes in this 64 y/o F with ARF currently requiring HD.  K= 3.3  Plan:  No indication for electrolyte supplementation. Nephrology is following for increase serum creatinine with good urine output - per note could be related to hypotension. Will recheck electrolytes with AM labs.   Laural Benes, Pharm.D., BCPS Clinical Pharmacist 05/16/2015,6:29 AM

## 2015-05-16 NOTE — Progress Notes (Signed)
Patient was transferred unit for intubation. Able to intubate this patient w/o difficulty 8.0 et tube positioned 24 at lip. Positive color change noted on end tidal co2. bbs noted throughout. cxr was performed. Placed patient on vent settings per md. Additional vent changes and abg to follow.

## 2015-05-16 NOTE — Progress Notes (Signed)
[place] Progress Note Patient Name: Jean Tucker DOB: 10-27-1950 MRN: [id number]   Date of Service  05/16/2015  HPI/Events of Note  Patient reportedly hypercarbic on ABG w/ stupor. Transferred to ICU & intubated.  eICU Interventions  1. Mechanical ventilation per protocol 2. ABG in 1 hour 3. Port CXR to confirm placement     Intervention Category Major Interventions: Respiratory failure - evaluation and management  Tera Partridge 05/16/2015, 6:30 AM

## 2015-05-16 NOTE — Progress Notes (Addendum)
Noticed pt. Having minor tremors in BUE and BLE, paged E-link to room and spoke with Elta Guadeloupe, RN who stated he would pass this information to Dr. Aletha Halim continue to monitor closely while awaiting new orders.

## 2015-05-16 NOTE — Progress Notes (Signed)
Called for rapid response in room 124. Pt unresponsive. ABG results showed pt needed intubation. Pt to rooom 8 emergently intubated by respiratory. Family called by sending floor.

## 2015-05-16 NOTE — Progress Notes (Signed)
[facility]  ROUNDING NOTE   Subjective:   Patient was found to be unresponsive on the floor She was transferred to the ICU ABG shows severe CO2 retention She was intubated earlier this morning Exline Blood pressure is somewhat low due to sedatives She has been started on pressors Serum creatinine worse at 3.65 Phosphorus is high at 8.2 Chest x-ray showed bilateral pleural effusion  Objective:  Vital signs in last 24 hours:  Temp:  [97.7 F (36.5 C)-97.9 F (36.6 C)] 97.7 F (36.5 C) (12/23 1549) Pulse Rate:  [63-99] 65 (12/24 0640) Resp:  [16-20] 20 (12/24 0640) BP: (95-141)/(45-64) 100/59 mmHg (12/24 0640) SpO2:  [86 %-100 %] 100 % (12/24 0750) FiO2 (%):  [40 %-45 %] 40 % (12/24 0752)  Weight change:  Filed Weights   05/13/15 0500 05/14/15 0500 05/15/15 0455  Weight: 93.3 kg (205 lb 11 oz) 90.6 kg (199 lb 11.8 oz) 91.264 kg (201 lb 3.2 oz)    Intake/Output: I/O last 3 completed shifts: In: 900 [Other:900] Out: 400 [Urine:400]   Intake/Output this shift:     Physical Exam: General:  critically ill   HENT  ET tube in place   Eyes: Anicteric,   Neck: Supple,   Lungs:  Vent assisted  Heart: irregular rate and rhythm, A Fib  Abdomen:  Soft, nontender, mildly distended  Extremities: 1+ dependent and peripheral edema.  Neurologic: Sedated at present  Skin: No lesions  Access: Right femoral vascath 12/16 Dr. Stevenson Clinch. Day 8  Foley  Basic Metabolic Panel:  Recent Labs Lab 05/11/15 1449 05/12/15 0415 05/13/15 0418 05/14/15 0520 05/15/15 0528 05/16/15 0527  NA  --  139 138 141 138 138  K  --  4.2 3.3* 3.7 3.8 4.9  CL  --  103 100* 101 99* 97*  CO2  --  29 31 32 34* 35*  GLUCOSE  --  132* 126* 132* 196* 186*  BUN  --  31* 28* 29* 28* 28*  CREATININE  --  3.78* 3.36* 3.53* 3.55* 3.65*  CALCIUM  --  8.7* 8.6* 8.6* 8.7* 8.9  MG  --   --  1.8 1.7 1.6* 2.7*  PHOS 4.8*  --  3.7 4.6 5.5* 8.2*    Liver Function Tests:  Recent Labs Lab  05/10/15 0158 05/11/15 0614 05/13/15 0418 05/15/15 0528 05/16/15 0527  AST  --   --  24 33 36  ALT  --   --  13* 16 19  ALKPHOS  --   --  55 49 54  BILITOT  --   --  1.2 1.1 1.4*  PROT  --   --  7.9 8.1 8.8*  ALBUMIN 2.5* 2.6* 2.5* 2.6* 2.7*   No results for input(s): LIPASE, AMYLASE in the last 168 hours.  Recent Labs Lab 05/15/15 0528 05/15/15 1359  AMMONIA 47* 38*    CBC:  Recent Labs Lab 05/12/15 0415 05/13/15 0418 05/14/15 0520 05/15/15 0528 05/16/15 0527  WBC 12.0* 9.9 9.9 10.9 12.9*  HGB 8.9* 8.7* 8.3* 8.6* 8.4*  HCT 28.0* 26.8* 26.3* 26.8* 28.0*  MCV 93.3 93.0 92.3 94.0 96.9  PLT 205 173 158 160 189    Cardiac Enzymes:  Recent Labs Lab 05/09/15 1300 05/10/15 0158  CKMB 1.8 2.0  TROPONINI 0.03 0.04*    BNP: Invalid input(s): POCBNP  CBG:  Recent Labs Lab 05/11/15 0903 05/16/15 0551  GLUCAP 115* 162*    Microbiology: Results for orders placed or performed during the hospital encounter of  05/11/2015  Culture, blood (Routine X 2) w Reflex to ID Panel     Status: None   Collection Time: 04/25/2015  2:18 AM  Result Value Ref Range Status   Specimen Description BLOOD LEFT ASSIST CONTROL  Final   Special Requests BOTTLES DRAWN AEROBIC AND ANAEROBIC 5 CC  Final   Culture NO GROWTH 5 DAYS  Final   Report Status 05/13/2015 FINAL  Final  Culture, blood (Routine X 2) w Reflex to ID Panel     Status: None   Collection Time: 05/07/2015  2:18 AM  Result Value Ref Range Status   Specimen Description BLOOD RIGHT ASSIST CONTROL  Final   Special Requests BOTTLES DRAWN AEROBIC AND ANAEROBIC 1 CC  Final   Culture NO GROWTH 5 DAYS  Final   Report Status 05/13/2015 FINAL  Final  Urine culture     Status: None   Collection Time: 05/09/2015  6:05 AM  Result Value Ref Range Status   Specimen Description URINE, RANDOM  Final   Special Requests NONE  Final   Culture 50,000 COLONIES/mL CANDIDA ALBICANS  Final   Report Status 05/11/2015 FINAL  Final     Coagulation Studies: No results for input(s): LABPROT, INR in the last 72 hours.  Urinalysis: No results for input(s): COLORURINE, LABSPEC, PHURINE, GLUCOSEU, HGBUR, BILIRUBINUR, KETONESUR, PROTEINUR, UROBILINOGEN, NITRITE, LEUKOCYTESUR in the last 72 hours.  Invalid input(s): APPERANCEUR    Imaging: Dg Abd 1 View  05/16/2015  CLINICAL DATA:  NG tube placement assessment EXAM: ABDOMEN - 1 VIEW COMPARISON:  None. FINDINGS: Nasogastric tube is identified with distal tip in the mid stomach. There is relative paucity of bowel gas, nonspecific. A stent is identified in the right upper quadrant. IMPRESSION: Nasogastric tube is identified with distal tip in the mid stomach. Electronically Signed   By: Abelardo Diesel M.D.   On: 05/16/2015 07:19   Dg Chest Port 1 View  05/16/2015  CLINICAL DATA:  NEW INTUBATION.: CLINICAL DATA:  NEW INTUBATION. New intubation EXAM: PORTABLE CHEST 1 VIEW COMPARISON:  05/15/2015 FINDINGS: Endotracheal tube is positioned 20 mm from carina. Stable enlarged cardiac silhouette. There is improvement in aeration the lung bases with decreased pleural effusion. There is bilateral upper lobe pulmonary edema/venous congestion similar prior. No pneumothorax. IMPRESSION: 1. Endotracheal tube in good position. 2. Improved aeration to the lung bases. 3. Persistent cardiomegaly and bilateral upper lobe airspace disease suggesting edema. Electronically Signed   By: Suzy Bouchard M.D.   On: 05/16/2015 07:20   Dg Chest Port 1 View  05/15/2015  CLINICAL DATA:  Chest congestion and cough. The patient was difficult to arouse today. EXAM: PORTABLE CHEST 1 VIEW COMPARISON:  Single view of the chest 05/13/2015 and 05/12/2015. FINDINGS: Marked cardiomegaly is again seen. There has been interval increase in a moderate right pleural effusion. Left pleural effusion is unchanged. Bilateral upper lobe airspace disease identified. Aeration in the left mid chest appears somewhat improved.  IMPRESSION: Bilateral pleural effusions, markedly increased on the right. Cardiomegaly and bilateral airspace disease compatible with pulmonary edema and/or pneumonia. Aeration appears somewhat improved in the left mid lung zone. Electronically Signed   By: Inge Rise M.D.   On: 05/15/2015 14:07     Medications:   . sodium chloride    . norepinephrine (LEVOPHED) Adult infusion 4 mcg/min (05/16/15 [id number])  . propofol (DIPRIVAN) infusion 35 mcg/kg/min (05/16/15 0653)   . antiseptic oral rinse  7 mL Mouth Rinse BID  . antiseptic oral rinse  7 mL  Mouth Rinse QID  . chlorhexidine gluconate  15 mL Mouth Rinse BID  . heparin subcutaneous  5,000 Units Subcutaneous Q12H  . ipratropium-albuterol  3 mL Nebulization Q6H  . lactulose  20 g Per Tube BID  . [START ON 05/17/2015] levothyroxine  100 mcg Per Tube Q0600  . pantoprazole sodium  40 mg Per Tube Q1200  . propofol      . rifaximin  550 mg Per Tube BID  . sevelamer carbonate  1,600 mg Oral TID WC  . sodium chloride  3 mL Intravenous Q12H   acetaminophen, alteplase, heparin, ipratropium-albuterol, lidocaine (PF), lidocaine-prilocaine, ondansetron **OR** ondansetron (ZOFRAN) IV, pentafluoroprop-tetrafluoroeth  Assessment/ Plan:  SHALANE GREEN is a 64 y.o. white female GERD, hyperlipidemia, osteoporosis, rheumatoid arthritis, cirrhosis of the liver secondary to autoimmune process, esophageal varices status post TIPS procedure, who was admitted to [facility] on 05/06/2015 for evaluation of shortness of breath.  1. Acute renal failure:likely ATN after TIPS procedure. Hemodialysis 12/16 due to worsening renal function.   Baseline creatinine of 0.85 from 12/2 Continue Lasix drip for now Electrolytes and Volume status are acceptable No acute indication for Dialysis at present  Lasix drip has been stopped Continue hemodynamic support Monitor UOP closely.  2. Liver cirrhosis (autoimmune in etiology) with history of esophogeal varices s/p TIPS at  [facility] 04/25/15.  - rifaximin and lactulose for hepatic encephalopathy  3. Acute resp failure from Pneumonia and Acute pulmonary edema:  - intubated again on dec 24 - pleural effusions - can consider iv albumin for oncotic support  4. Generalized edema Improved with dialysis and lasix drip At present, goal is to preserve intravascular volume and to keep i/o even  5. Somnolence - ? Related to klonopin - ABG showed pCO@ 147 suggesting severe CO2 retention    LOS: 8 Jean Tucker 12/24/20168:57 AM

## 2015-05-16 NOTE — Plan of Care (Signed)
Problem: Education: Goal: Knowledge of disease or condition will improve Outcome: Progressing Daily assessments being made to determine need for dialysis.  Problem: Respiratory: Goal: Respiratory status will improve Outcome: Progressing Pt continues antibiotics and nebulizer treatments.

## 2015-05-16 NOTE — Progress Notes (Signed)
Found patient to be unresponsive when lab came to draw blood.  Vitals taken, no response, no temperature recordable, BP 95/45, O2 sats at 83% on 3L.  Increased O2 to 5L,  sats only increased to 86%.  Called rapid response.  Pt sent to unit.  Husband called.

## 2015-05-16 NOTE — Progress Notes (Signed)
Received report from [facility], RN assuming care of pt. - Pt. Ventilated @ 40%, with propofol @ 30 mcg and levophed @ 15 mcg running cont. - Pt. Has foley and R femoral vascath 3L ( dialysis ports heparin locked, 3rd lumen with drips running) - Pt. Has OGT clamped - RASS: -4 Will monitor and titrate gtt's as ordered.

## 2015-05-16 NOTE — Progress Notes (Signed)
Paged Dr. Candiss Norse that patient has only had 100 ml UOP over eight hours, advised IVFs: NS 50 ml/hr, Norepinephrine 15 mcg/56 ml/hr, and Propofol 30 mcg/16 m/hr, with 1L IVF this a.m. Per MD, continue to monitor, BMP in a.m., may need CRRT tomorrow.

## 2015-05-17 ENCOUNTER — Inpatient Hospital Stay: Payer: BLUE CROSS/BLUE SHIELD

## 2015-05-17 LAB — BLOOD GAS, ARTERIAL
Acid-Base Excess: 8.5 mmol/L — ABNORMAL HIGH (ref 0.0–3.0)
Allens test (pass/fail): POSITIVE — AB
BICARBONATE: 32.8 meq/L — AB (ref 21.0–28.0)
FIO2: 0.4
LHR: 16 {breaths}/min
MECHVT: 440 mL
O2 SAT: 94.4 %
PATIENT TEMPERATURE: 37
PCO2 ART: 44 mmHg (ref 32.0–48.0)
PEEP/CPAP: 5 cmH2O
PO2 ART: 67 mmHg — AB (ref 83.0–108.0)
pH, Arterial: 7.48 — ABNORMAL HIGH (ref 7.350–7.450)

## 2015-05-17 LAB — BASIC METABOLIC PANEL
Anion gap: 7 (ref 5–15)
BUN: 34 mg/dL — ABNORMAL HIGH (ref 6–20)
CHLORIDE: 104 mmol/L (ref 101–111)
CO2: 30 mmol/L (ref 22–32)
Calcium: 8.2 mg/dL — ABNORMAL LOW (ref 8.9–10.3)
Creatinine, Ser: 3.75 mg/dL — ABNORMAL HIGH (ref 0.44–1.00)
GFR calc Af Amer: 14 mL/min — ABNORMAL LOW (ref >60)
GFR calc non Af Amer: 12 mL/min — ABNORMAL LOW (ref >60)
Glucose, Bld: 132 mg/dL — ABNORMAL HIGH (ref 65–99)
Potassium: 3.4 mmol/L — ABNORMAL LOW (ref 3.5–5.1)
SODIUM: 141 mmol/L (ref 135–145)

## 2015-05-17 LAB — GLUCOSE, CAPILLARY
Glucose-Capillary: 108 mg/dL — ABNORMAL HIGH (ref 65–99)
Glucose-Capillary: 122 mg/dL — ABNORMAL HIGH (ref 65–99)

## 2015-05-17 LAB — TSH: TSH: 3.524 u[IU]/mL (ref 0.350–4.500)

## 2015-05-17 LAB — C DIFFICILE QUICK SCREEN W PCR REFLEX
C DIFFICILE (CDIFF) INTERP: NEGATIVE
C Diff antigen: NEGATIVE
C Diff toxin: NEGATIVE

## 2015-05-17 LAB — PHOSPHORUS: PHOSPHORUS: 3.9 mg/dL (ref 2.5–4.6)

## 2015-05-17 LAB — CBC
HEMATOCRIT: 22 % — AB (ref 35.0–47.0)
HEMOGLOBIN: 7.2 g/dL — AB (ref 12.0–16.0)
MCH: 30.1 pg (ref 26.0–34.0)
MCHC: 32.9 g/dL (ref 32.0–36.0)
MCV: 91.4 fL (ref 80.0–100.0)
Platelets: 140 10*3/uL — ABNORMAL LOW (ref 150–440)
RBC: 2.41 MIL/uL — AB (ref 3.80–5.20)
RDW: 19.4 % — ABNORMAL HIGH (ref 11.5–14.5)
WBC: 13.6 10*3/uL — AB (ref 3.6–11.0)

## 2015-05-17 LAB — MAGNESIUM: Magnesium: 2.2 mg/dL (ref 1.7–2.4)

## 2015-05-17 MED ORDER — PHENYLEPHRINE HCL 10 MG/ML IJ SOLN
30.0000 ug/min | INTRAVENOUS | Status: DC
  Filled 2015-05-17: qty 1

## 2015-05-17 MED ORDER — ALBUMIN HUMAN 25 % IV SOLN
12.5000 g | Freq: Every day | INTRAVENOUS | Status: AC
  Administered 2015-05-17 – 2015-05-19 (×3): 12.5 g via INTRAVENOUS
  Filled 2015-05-17 (×4): qty 50

## 2015-05-17 MED ORDER — FREE WATER
100.0000 mL | Freq: Three times a day (TID) | Status: DC
  Administered 2015-05-17 – 2015-05-19 (×7): 100 mL

## 2015-05-17 MED ORDER — FENTANYL CITRATE (PF) 100 MCG/2ML IJ SOLN
25.0000 ug | INTRAMUSCULAR | Status: DC | PRN
  Administered 2015-05-17: 100 ug via INTRAVENOUS
  Administered 2015-05-17 (×3): 50 ug via INTRAVENOUS
  Administered 2015-05-17: 25 ug via INTRAVENOUS
  Administered 2015-05-18: 100 ug via INTRAVENOUS
  Administered 2015-05-18: 50 ug via INTRAVENOUS
  Administered 2015-05-18: 75 ug via INTRAVENOUS
  Administered 2015-05-18 (×2): 100 ug via INTRAVENOUS
  Administered 2015-05-18 – 2015-05-19 (×2): 50 ug via INTRAVENOUS
  Administered 2015-05-19 (×2): 100 ug via INTRAVENOUS
  Filled 2015-05-17 (×17): qty 2

## 2015-05-17 MED ORDER — METOPROLOL TARTRATE 25 MG/10 ML ORAL SUSPENSION
12.5000 mg | Freq: Two times a day (BID) | ORAL | Status: DC
  Filled 2015-05-17 (×2): qty 5

## 2015-05-17 MED ORDER — SODIUM CHLORIDE 0.9 % IV BOLUS (SEPSIS)
750.0000 mL | Freq: Once | INTRAVENOUS | Status: AC
  Administered 2015-05-17: 750 mL via INTRAVENOUS

## 2015-05-17 MED ORDER — POTASSIUM CHLORIDE 10 MEQ/100ML IV SOLN
10.0000 meq | INTRAVENOUS | Status: DC
  Administered 2015-05-17 (×3): 10 meq via INTRAVENOUS
  Filled 2015-05-17 (×3): qty 100

## 2015-05-17 MED ORDER — VITAL HIGH PROTEIN PO LIQD
1000.0000 mL | ORAL | Status: DC
  Administered 2015-05-17 (×4)
  Administered 2015-05-17 (×2): 1000 mL
  Administered 2015-05-17 (×2)
  Administered 2015-05-18: 1000 mL
  Administered 2015-05-18 (×2)
  Administered 2015-05-18: 1000 mL
  Administered 2015-05-18 (×4)
  Administered 2015-05-19: 1000 mL
  Administered 2015-05-19 – 2015-05-20 (×2)

## 2015-05-17 MED ORDER — METOPROLOL TARTRATE 1 MG/ML IV SOLN
2.5000 mg | INTRAVENOUS | Status: DC | PRN
Start: 2015-05-17 — End: 2015-05-21
  Administered 2015-05-19: 2.5 mg via INTRAVENOUS
  Filled 2015-05-17: qty 5

## 2015-05-17 MED ORDER — PHENYLEPHRINE HCL 10 MG/ML IJ SOLN
30.0000 ug/min | INTRAMUSCULAR | Status: DC
  Administered 2015-05-17: 130 ug/min via INTRAVENOUS
  Administered 2015-05-17: 140 ug/min via INTRAVENOUS
  Administered 2015-05-17: 30 ug/min via INTRAVENOUS
  Administered 2015-05-17: 120 ug/min via INTRAVENOUS
  Administered 2015-05-18: 160 ug/min via INTRAVENOUS
  Administered 2015-05-18: 125.067 ug/min via INTRAVENOUS
  Administered 2015-05-18: 160 ug/min via INTRAVENOUS
  Administered 2015-05-18: 135 ug/min via INTRAVENOUS
  Administered 2015-05-19: 125 ug/min via INTRAVENOUS
  Administered 2015-05-19: 70 ug/min via INTRAVENOUS
  Administered 2015-05-20: 30 ug/min via INTRAVENOUS
  Filled 2015-05-17 (×15): qty 4

## 2015-05-17 MED ORDER — METOPROLOL TARTRATE 25 MG PO TABS
12.5000 mg | ORAL_TABLET | Freq: Two times a day (BID) | ORAL | Status: DC
  Administered 2015-05-17 – 2015-05-18 (×4): 12.5 mg
  Filled 2015-05-17 (×5): qty 1

## 2015-05-17 MED ORDER — MIDAZOLAM HCL 2 MG/2ML IJ SOLN: 1.0000 mg | INTRAMUSCULAR | Status: DC | PRN

## 2015-05-17 NOTE — Progress Notes (Signed)
Pt. HR still in 120's-130's sustained A-fib.  Neo gtt on since 0220.  Spoke w/ Dr. Chase Caller @ [place] recommending amio administration.  Discussed w/ Dr. Pt.'s FiO2% and amount of output over shift.   Dr. Vonzella Nipple NS fluid bolus.  Will monitor and call MD back if no change.

## 2015-05-17 NOTE — Progress Notes (Signed)
RN spoke with Dr. Alva Garnet regarding chest xray and needing to retract ET tube and also asked MD if metoprolol should be given since blood pressure is low 90/47 with MAP of 61 and patient on neo drip.  Dr. Alva Garnet stated that ET tube would need to be retracted 2cm and for RN to given metoprolol because he wants patient to stay in sinus rhythm and patient is on neo drip to support blood pressure. Erica, RT present and aware that ET tube needs to be retracted.

## 2015-05-17 NOTE — Progress Notes (Signed)
[place] Progress Note Patient Name: Jean Tucker DOB: 1951-04-29 MRN: [id number]   Date of Service  05/17/2015  HPI/Events of Note   AFib RVR  eICU Interventions  chagne levophed to neo     Intervention Category Major Interventions: Arrhythmia - evaluation and management  Takina Busser 05/17/2015, 2:06 AM

## 2015-05-17 NOTE — Progress Notes (Signed)
Spoke w/ Dr. Chase Caller from [place] r/t pt. Change in rhythm to A-fib RVR.  MD ordered levo gtt changed to neo gtt.  Waiting for new gtt from Rx. Will continue to monitor pt. Closely.

## 2015-05-17 NOTE — Progress Notes (Signed)
Sedated with propofol.  Patient waking up intermittently and restless in bed squirming around but not following commands. Localizes pain. Blood pressure stable on neo drip.  Heart rhythm in and out of afib and sinus rhythm. Tolerating ventilator.  Flexi seal in place.  C-diff negative. 100cc UOP thus far during shift.  Daughter and husband visiting and speaking with Dr. Volanda Napoleon at this time. Report given to Margreta Journey, RN at this time who is now taking over patient's care.

## 2015-05-17 NOTE — Progress Notes (Signed)
PULM/CCM NOTE   MAJOR EVENTS/TEST RESULTS: 12/16 Admitted to ICU /SDU with dyspnea, dx of PNA, AKI. Intermittent BiPAP 12/18 transferred out of ICU 12/19 Pt transferred back to ICU/SDU on BiPAP. Resp status improved after HD. Severe delirium after Ambien 12/19 PM 12/22 Back to baseline mentation. PCCM signed off.  12/23 transferred out of ICU /SDU 12/24 returned to ICU with severe hypercarbic respiratory failure requiring intubation 12/25 AFRVR. NE changed to phenylephrine. Low dose metoprolol initiated  INDWELLING DEVICES:: R fem HD cath 12/17 >>  ETT 12/24 >>   MICRO DATA: Blood 12/16 >> NEG Urine 12/16 >> 50k colonies candida  ANTIMICROBIALS:    SUBJ: RASS -2, not F/C. On propofol. On low dose norepinephrine  Filed Vitals:   05/17/15 1215 05/17/15 1230 05/17/15 1300 05/17/15 1400  BP: 115/63 102/74 110/69 112/71  Pulse: 106 105 107 107  Temp:      TempSrc:      Resp: 16 18 16 16   Height:      Weight:      SpO2: 99% 98% 99% 100%   Intubated, sedated HEENT WNL JVP not visualized No wheezes RRR s M NABS, soft, NT No LE edema  BMP Latest Ref Rng 05/17/2015 05/16/2015 05/15/2015  Glucose 65 - 99 mg/dL 132(H) 186(H) 196(H)  BUN 6 - 20 mg/dL 34(H) 28(H) 28(H)  Creatinine 0.44 - 1.00 mg/dL 3.75(H) 3.65(H) 3.55(H)  Sodium 135 - 145 mmol/L 141 138 138  Potassium 3.5 - 5.1 mmol/L 3.4(L) 4.9 3.8  Chloride 101 - 111 mmol/L 104 97(L) 99(L)  CO2 22 - 32 mmol/L 30 35(H) 34(H)  Calcium 8.9 - 10.3 mg/dL 8.2(L) 8.9 8.7(L)    CBC Latest Ref Rng 05/17/2015 05/16/2015 05/15/2015  WBC 3.6 - 11.0 K/uL 13.6(H) 12.9(H) 10.9  Hemoglobin 12.0 - 16.0 g/dL 7.2(L) 8.4(L) 8.6(L)  Hematocrit 35.0 - 47.0 % 22.0(L) 28.0(L) 26.8(L)  Platelets 150 - 440 K/uL 140(L) 189 160    CXR: [place]a L>R infiltrates  IMPRESSION: 1) Recurrent acute on chronic hypercarbic and  hypoxic respiratory failure with bilateral pulmonary infiltrates   She seems to have impaired ventilatory drive. Etiology  unclear 2) PAF > NSR presently 3) Hypotension 4) AKI on CKD. Borderline oliguria but responds to diuresis 5) Autoimmune hepatitis/cirrhosis - was on transplant list prior to this illness 6) Anemia of critical illness 7) Toxic-metabolic encephalopathy - hypercarbic 8) hypothyroidism - recheck TSH 12/25 9) guarded prognosis  PLAN/REC: Cont full vent support - settings reviewed and/or adjusted Cont vent bundle Daily SBT if/when meets criteria Begin low dose metoprolol.  Cont phenylephrine to maintain MAP > 65 mmHg Monitor BMET intermittently Monitor I/Os Correct electrolytes as indicated No HD planned 12/25 Monitor temp, WBC count Micro and abx as above DVT px: SCDs Monitor CBC intermittently Transfuse per usual guidelines   Husband updated @ bedside.  CCM Time - 35 mins  Merton Border, MD PCCM service Mobile [phone] Pager [phone]

## 2015-05-17 NOTE — Progress Notes (Signed)
[facility]  ROUNDING NOTE   Subjective:   Patient was found to be unresponsive on the floor She was transferred to the ICU, ABG shows severe CO2 retention, She was intubated 12/24 Blood pressure is somewhat low due to sedatives She is requiring pressor support Serum creatinine worse at 3.75; UOP 353 cc Phosphorus has improved to 3.9    Objective:  Vital signs in last 24 hours:  Temp:  [94 F (34.4 C)-99.7 F (37.6 C)] 99 F (37.2 C) (12/25 0752) Pulse Rate:  [65-138] 111 (12/25 0730) Resp:  [15-25] 21 (12/25 0730) BP: (78-120)/(34-101) 105/53 mmHg (12/25 0730) SpO2:  [92 %-100 %] 93 % (12/25 0730) FiO2 (%):  [40 %] 40 % (12/25 0400) Weight:  [89.4 kg (197 lb 1.5 oz)] 89.4 kg (197 lb 1.5 oz) (12/25 0429)  Weight change:  Filed Weights   05/14/15 0500 05/15/15 0455 05/17/15 0429  Weight: 90.6 kg (199 lb 11.8 oz) 91.264 kg (201 lb 3.2 oz) 89.4 kg (197 lb 1.5 oz)    Intake/Output: I/O last 3 completed shifts: In: 3661.1 [I.V.:2711.1; NG/GT:100; IV Piggyback:850] Out: [id number] [Urine:753]   Intake/Output this shift:  Total I/O In: 63.4 [I.V.:63.4] Out: -   Physical Exam: General:  critically ill   HENT  ET tube in place   Eyes: Anicteric,   Neck: Supple,   Lungs:  Vent assisted  Heart: irregular rate and rhythm, A Fib  Abdomen:  Soft, nontender, mildly distended  Extremities: 1+ dependent and peripheral edema.  Neurologic: Sedated at present  Skin: No lesions  Access: Right femoral vascath 12/16 Dr. Stevenson Clinch. Day 9  Foley  Basic Metabolic Panel:  Recent Labs Lab 05/13/15 0418 05/14/15 0520 05/15/15 0528 05/16/15 0527 05/17/15 0438  NA 138 141 138 138 141  K 3.3* 3.7 3.8 4.9 3.4*  CL 100* 101 99* 97* 104  CO2 31 32 34* 35* 30  GLUCOSE 126* 132* 196* 186* 132*  BUN 28* 29* 28* 28* 34*  CREATININE 3.36* 3.53* 3.55* 3.65* 3.75*  CALCIUM 8.6* 8.6* 8.7* 8.9 8.2*  MG 1.8 1.7 1.6* 2.7* 2.2  PHOS 3.7 4.6 5.5* 8.2* 3.9    Liver Function  Tests:  Recent Labs Lab 05/11/15 0614 05/13/15 0418 05/15/15 0528 05/16/15 0527  AST  --  24 33 36  ALT  --  13* 16 19  ALKPHOS  --  55 49 54  BILITOT  --  1.2 1.1 1.4*  PROT  --  7.9 8.1 8.8*  ALBUMIN 2.6* 2.5* 2.6* 2.7*   No results for input(s): LIPASE, AMYLASE in the last 168 hours.  Recent Labs Lab 05/15/15 0528 05/15/15 1359  AMMONIA 47* 38*    CBC:  Recent Labs Lab 05/13/15 0418 05/14/15 0520 05/15/15 0528 05/16/15 0527 05/17/15 0438  WBC 9.9 9.9 10.9 12.9* 13.6*  HGB 8.7* 8.3* 8.6* 8.4* 7.2*  HCT 26.8* 26.3* 26.8* 28.0* 22.0*  MCV 93.0 92.3 94.0 96.9 91.4  PLT 173 158 160 189 140*    Cardiac Enzymes: No results for input(s): CKTOTAL, CKMB, CKMBINDEX, TROPONINI in the last 168 hours.  BNP: Invalid input(s): POCBNP  CBG:  Recent Labs Lab 05/11/15 0903 05/16/15 0551 05/16/15 2056 05/17/15 0501  GLUCAP 115* 162* 127* 108*    Microbiology: Results for orders placed or performed during the hospital encounter of 05/04/2015  Culture, blood (Routine X 2) w Reflex to ID Panel     Status: None   Collection Time: 05/17/2015  2:18 AM  Result Value Ref Range Status  Specimen Description BLOOD LEFT ASSIST CONTROL  Final   Special Requests BOTTLES DRAWN AEROBIC AND ANAEROBIC 5 CC  Final   Culture NO GROWTH 5 DAYS  Final   Report Status 05/13/2015 FINAL  Final  Culture, blood (Routine X 2) w Reflex to ID Panel     Status: None   Collection Time: 04/28/2015  2:18 AM  Result Value Ref Range Status   Specimen Description BLOOD RIGHT ASSIST CONTROL  Final   Special Requests BOTTLES DRAWN AEROBIC AND ANAEROBIC 1 CC  Final   Culture NO GROWTH 5 DAYS  Final   Report Status 05/13/2015 FINAL  Final  Urine culture     Status: None   Collection Time: 04/26/2015  6:05 AM  Result Value Ref Range Status   Specimen Description URINE, RANDOM  Final   Special Requests NONE  Final   Culture 50,000 COLONIES/mL CANDIDA ALBICANS  Final   Report Status 05/11/2015 FINAL  Final     Coagulation Studies: No results for input(s): LABPROT, INR in the last 72 hours.  Urinalysis: No results for input(s): COLORURINE, LABSPEC, PHURINE, GLUCOSEU, HGBUR, BILIRUBINUR, KETONESUR, PROTEINUR, UROBILINOGEN, NITRITE, LEUKOCYTESUR in the last 72 hours.  Invalid input(s): APPERANCEUR    Imaging: Dg Abd 1 View  05/16/2015  CLINICAL DATA:  NG tube placement assessment EXAM: ABDOMEN - 1 VIEW COMPARISON:  None. FINDINGS: Nasogastric tube is identified with distal tip in the mid stomach. There is relative paucity of bowel gas, nonspecific. A stent is identified in the right upper quadrant. IMPRESSION: Nasogastric tube is identified with distal tip in the mid stomach. Electronically Signed   By: Abelardo Diesel M.D.   On: 05/16/2015 07:19   Dg Chest Port 1 View  05/16/2015  CLINICAL DATA:  NEW INTUBATION.: CLINICAL DATA:  NEW INTUBATION. New intubation EXAM: PORTABLE CHEST 1 VIEW COMPARISON:  05/15/2015 FINDINGS: Endotracheal tube is positioned 20 mm from carina. Stable enlarged cardiac silhouette. There is improvement in aeration the lung bases with decreased pleural effusion. There is bilateral upper lobe pulmonary edema/venous congestion similar prior. No pneumothorax. IMPRESSION: 1. Endotracheal tube in good position. 2. Improved aeration to the lung bases. 3. Persistent cardiomegaly and bilateral upper lobe airspace disease suggesting edema. Electronically Signed   By: Suzy Bouchard M.D.   On: 05/16/2015 07:20   Dg Chest Port 1 View  05/15/2015  CLINICAL DATA:  Chest congestion and cough. The patient was difficult to arouse today. EXAM: PORTABLE CHEST 1 VIEW COMPARISON:  Single view of the chest 05/13/2015 and 05/12/2015. FINDINGS: Marked cardiomegaly is again seen. There has been interval increase in a moderate right pleural effusion. Left pleural effusion is unchanged. Bilateral upper lobe airspace disease identified. Aeration in the left mid chest appears somewhat improved.  IMPRESSION: Bilateral pleural effusions, markedly increased on the right. Cardiomegaly and bilateral airspace disease compatible with pulmonary edema and/or pneumonia. Aeration appears somewhat improved in the left mid lung zone. Electronically Signed   By: Inge Rise M.D.   On: 05/15/2015 14:07     Medications:   . sodium chloride 50 mL/hr at 05/17/15 0600  . phenylephrine (NEO-SYNEPHRINE) Adult infusion 140 mcg/min (05/17/15 0815)  . propofol (DIPRIVAN) infusion 20 mcg/kg/min (05/17/15 0700)   . antiseptic oral rinse  7 mL Mouth Rinse BID  . antiseptic oral rinse  7 mL Mouth Rinse QID  . chlorhexidine gluconate  15 mL Mouth Rinse BID  . heparin subcutaneous  5,000 Units Subcutaneous Q12H  . ipratropium-albuterol  3 mL Nebulization Q6H  .  lactulose  20 g Per Tube BID  . levothyroxine  100 mcg Per Tube Q0600  . pantoprazole sodium  40 mg Per Tube Q1200  . potassium chloride  10 mEq Intravenous Q1 Hr x 3  . rifaximin  550 mg Per Tube BID  . sevelamer carbonate  1,600 mg Oral TID WC  . sodium chloride  3 mL Intravenous Q12H   acetaminophen, alteplase, fentaNYL (SUBLIMAZE) injection, heparin, ipratropium-albuterol, lidocaine (PF), lidocaine-prilocaine, midazolam, ondansetron **OR** ondansetron (ZOFRAN) IV, pentafluoroprop-tetrafluoroeth  Assessment/ Plan:  MAGALY MCCLURE is a 64 y.o. 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  1. Acute renal failure:likely ATN after TIPS procedure. Hemodialysis 12/16 due to worsening renal function.   Baseline creatinine of 0.85 from 12/2 Electrolytes and Volume status are acceptable No acute indication for Dialysis at present  Lasix drip has been stopped Continue hemodynamic support Monitor UOP closely.  2. 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  - rifaximin and lactulose for hepatic encephalopathy  3. Acute resp failure from Pneumonia and Acute pulmonary edema:  - intubated again on dec 24 - pleural effusions - can consider iv albumin for oncotic support  4. Generalized edema Improved with dialysis and lasix drip At present, goal is to preserve intravascular volume and to keep i/o even  5. Somnolence - ? Related to klonopin - ABG showed pCO@ 147 suggesting severe CO2 retention    LOS: 9 Jean Tucker 12/25/20168:21 AM

## 2015-05-17 NOTE — Progress Notes (Signed)
  elink note  AFib RVR 120s despite change to neo - current dose 35mcg/min  Plan 750cc fluid bolus and reassess  Dr. Brand Males, M.D., [facility].C.P Pulmonary and Critical Care Medicine Staff Physician [facility] Pager: [phone], If no answer or between  15:00h - 7:00h: call [phone]  05/17/2015 3:37 AM

## 2015-05-17 NOTE — Progress Notes (Signed)
[place] Progress Note Patient Name: Jean Tucker DOB: 01/10/1951 MRN: [id number]   Date of Service  05/17/2015  HPI/Events of Note  Fine rhytymic tremors while on diprivan - forerams bilaterally and epr RN even lower   eICU Interventions  Add fent and versed prn contnue diprivan Check abg Check ct head     Intervention Category Intermediate Interventions: Other:  Ly Bacchi 05/17/2015, 12:43 AM

## 2015-05-17 NOTE — Progress Notes (Addendum)
[place] [facility] [place] NAME: Jean Tucker    MR#:  [id number]  DATE OF BIRTH:  11-12-1950  SUBJECTIVE: 64 year old female patient with history of fall autoimmune liver disease with cirrhosis with recent history of for variceal bleed status post a TIPS procedure at [facility]. Patient signed out AMA from [place] yesterday. Patient noted to have acute renal failure developed after TIPS procedure. Also has pneumonia. At this time patient is admitted for healthcare associated pneumonia, acute renal failure.   CHIEF COMPLAINT:   Chief Complaint  Patient presents with  . Shortness of Breath   Intubated, full ventilator support, atrial fibrillation with RVR last night  REVIEW OF SYSTEMS:   ROS unable to obtain  DRUG ALLERGIES:   Allergies  Allergen Reactions  . Codeine Anaphylaxis  . Arava [Leflunomide] Other (See Comments)    Pt states that this medication causes liver damage.   . Atorvastatin Other (See Comments)    Pt states that this medication causes liver damage.   . Humira [Adalimumab] Other (See Comments)    Pt states that this medication causes liver damage.   . Methotrexate Derivatives Other (See Comments)    Pt states that this medication causes liver damage.   . Simvastatin Other (See Comments)    Pt states that this medication causes liver damage and joint pain.     VITALS:  Blood pressure 112/71, pulse 107, temperature 98.3 F (36.8 C), temperature source Axillary, resp. rate 16, height 5\' 4"  (1.626 m), weight 89.4 kg (197 lb 1.5 oz), SpO2 100 %.  PHYSICAL EXAMINATION:  GENERAL:  64 y.o.-year-old patient  Critically ill LUNGS: intubated, coarse ventilated breath sounds, good air movement CARDIOVASCULAR: S1, S2 normal. No murmurs, rubs, or gallops. Tachycardic, irregular ABDOMEN: Soft, nontender, nondistended. Bowel sounds present. No organomegaly or mass.  EXTREMITIES: No cyanosis, or  clubbing. +1 bilateral pedal edema NEUROLOGIC: sedated PSYCHIATRIC: sedated SKIN: No obvious rash, lesion, or ulcer.   LABORATORY PANEL:   CBC  Recent Labs Lab 05/17/15 0438  WBC 13.6*  HGB 7.2*  HCT 22.0*  PLT 140*   ------------------------------------------------------------------------------------------------------------------  Chemistries   Recent Labs Lab 05/16/15 0527 05/17/15 0438  NA 138 141  K 4.9 3.4*  CL 97* 104  CO2 35* 30  GLUCOSE 186* 132*  BUN 28* 34*  CREATININE 3.65* 3.75*  CALCIUM 8.9 8.2*  MG 2.7* 2.2  AST 36  --   ALT 19  --   ALKPHOS 54  --   BILITOT 1.4*  --    ------------------------------------------------------------------------------------------------------------------  Cardiac Enzymes No results for input(s): TROPONINI in the last 168 hours. ------------------------------------------------------------------------------------------------------------------  RADIOLOGY:  Dg Abd 1 View  05/16/2015  CLINICAL DATA:  NG tube placement assessment EXAM: ABDOMEN - 1 VIEW COMPARISON:  None. FINDINGS: Nasogastric tube is identified with distal tip in the mid stomach. There is relative paucity of bowel gas, nonspecific. A stent is identified in the right upper quadrant. IMPRESSION: Nasogastric tube is identified with distal tip in the mid stomach. Electronically Signed   By: Abelardo Diesel M.D.   On: 05/16/2015 07:19   Dg Chest Port 1 View  05/17/2015  CLINICAL DATA:  Respiratory failure EXAM: PORTABLE CHEST - 1 VIEW COMPARISON:  the previous day's study FINDINGS: The endotracheal tube tip is approximately 1 cm above the carina. A nasogastric tube extends at least as far as the stomach, tip not seen. Patchy interstitial and airspace opacities, left greater  than right age, slightly increased since previous exam. Cardiomegaly stable. Suspect layering pleural effusions. Orthopedic anchors in the rib right humeral head are noted. IMPRESSION: 1. Low  position of endotracheal tube, less than 1 cm above carina. 2. Worsening asymmetric infiltrates or edema, left greater than right. 3. Probable layering pleural effusions. Electronically Signed   By: Lucrezia Europe M.D.   On: 05/17/2015 09:45   Dg Chest Port 1 View  05/16/2015  CLINICAL DATA:  NEW INTUBATION.: CLINICAL DATA:  NEW INTUBATION. New intubation EXAM: PORTABLE CHEST 1 VIEW COMPARISON:  05/15/2015 FINDINGS: Endotracheal tube is positioned 20 mm from carina. Stable enlarged cardiac silhouette. There is improvement in aeration the lung bases with decreased pleural effusion. There is bilateral upper lobe pulmonary edema/venous congestion similar prior. No pneumothorax. IMPRESSION: 1. Endotracheal tube in good position. 2. Improved aeration to the lung bases. 3. Persistent cardiomegaly and bilateral upper lobe airspace disease suggesting edema. Electronically Signed   By: Suzy Bouchard M.D.   On: 05/16/2015 07:20    EKG:   Orders placed or performed during the hospital encounter of 05/02/2015  . EKG 12-Lead  . EKG 12-Lead  . ED EKG  . ED EKG  . EKG 12-Lead  . EKG 12-Lead    ASSESSMENT AND PLAN:   1. Acute hypercarbic respiratory failure: - intubated, PCCM following - CXR with edema, effusions  2. Healthcare associated pneumonia: completed course yesterday - Cultured data negative  #3 Acute renal failure due to ATN  - Appreciate nephrology following - Received HD 12/17, 12/19, 12/20 - has been on lasix drip with good urine output, but continued poor renal function - possible HD tomorrow  #3 cirrhosis of the liver with variceal bleed status post banding and tips.  - due to autoimmune hepatitis  - transferred from [facility] to [facility] on 12/2 and had TIPS on 12/3 - continue rifaximin and lactulose for encephalopathy - continue to monitor hepatic function - Continue albumin  #4. Hypothyroidism continue Synthroid.  5 history of rheumatoid arthritis: Intolerant to methotrexate, Humira,  ARava  6. Atrial fibrillation: RVR overnight - Levo changed to Neo-Synephrine, continue low-dose metoprolol - Rate controlled this morning  7. Hypotension - Continue pressor support and IV fluids  CODE STATUS: Full code. Discussed with the patient's husband today. For now he wants full code and aggressive treatment. He did say that she would not want to be on prolonged life support. We will continue to discuss daily.  All the records are reviewed and case discussed with Care Management/Social Workerr. Management plans discussed with the patient, family and they are in agreement.  CODE STATUS: full  TOTAL TIME TAKING CARE OF THIS PATIENT 35 minutes.   POSSIBLE D/C IN ? DAYS, DEPENDING ON CLINICAL CONDITION.   Myrtis Ser M.D on 05/17/2015 at 2:40 PM  Between 7am to 6pm - Pager - [phone]  After 6pm go to [email or web address] - password [email or web address] [place] [facility]  Office  [phone]  CC: Primary care physician; Loura Pardon, MD   Note: This dictation was prepared with Dragon dictation along with smaller phrase technology. Any transcriptional errors that result from this process are unintentional.

## 2015-05-17 NOTE — Progress Notes (Signed)
Pharmacy Consult for Electrolyte Monitoring and Replacement  Allergies  Allergen Reactions  . Codeine Anaphylaxis  . Arava [Leflunomide] Other (See Comments)    Pt states that this medication causes liver damage.   . Atorvastatin Other (See Comments)    Pt states that this medication causes liver damage.   . Humira [Adalimumab] Other (See Comments)    Pt states that this medication causes liver damage.   . Methotrexate Derivatives Other (See Comments)    Pt states that this medication causes liver damage.   . Simvastatin Other (See Comments)    Pt states that this medication causes liver damage and joint pain.     Patient Measurements: Height: 5\' 4"  (162.6 cm) Weight: 197 lb 1.5 oz (89.4 kg) IBW/kg (Calculated) : 54.7  Vital Signs: Temp: 99.7 F (37.6 C) (12/25 0400) Temp Source: Axillary (12/25 0400) BP: 92/50 mmHg (12/25 0545) Pulse Rate: 70 (12/25 0545) Intake/Output from previous day: 12/24 0701 - 12/25 0700 In: 3306.2 [I.V.:2456.2; NG/GT:100; IV Piggyback:750] Out: 303 [id number] Intake/Output from this shift: Total I/O In: 2065.8 [I.V.:1215.8; NG/GT:100; IV Piggyback:750] Out: 203 [Urine:203]  Labs:  Recent Labs  05/15/15 0528 05/16/15 0527 05/17/15 0438  WBC 10.9 12.9* 13.6*  HGB 8.6* 8.4* 7.2*  HCT 26.8* 28.0* 22.0*  PLT 160 189 140*     Recent Labs  05/15/15 0528 05/16/15 0527 05/17/15 0438  NA 138 138 141  K 3.8 4.9 3.4*  CL 99* 97* 104  CO2 34* 35* 30  GLUCOSE 196* 186* 132*  BUN 28* 28* 34*  CREATININE 3.55* 3.65* 3.75*  CALCIUM 8.7* 8.9 8.2*  MG 1.6* 2.7* 2.2  PHOS 5.5* 8.2* 3.9  PROT 8.1 8.8*  --   ALBUMIN 2.6* 2.7*  --   AST 33 36  --   ALT 16 19  --   ALKPHOS 49 54  --   BILITOT 1.1 1.4*  --   TRIG  --  65  --    Estimated Creatinine Clearance: 16.8 mL/min (by C-G formula based on Cr of 3.75).    Recent Labs  05/16/15 0551 05/16/15 2056 05/17/15 0501  GLUCAP 162* 127* 108*    Medical History: Past Medical History   Diagnosis Date  . Allergy   . GERD (gastroesophageal reflux disease)   . CAD (coronary artery disease)   . Hyperlipidemia   . Thyroid disease   . Osteoporosis   . Rheumatoid arthritis ([place])   . Esophageal varices ([place]) 09/22/2014    April, 2016-grade 2-3 varices  . Hypothyroidism   . Cirrhosis ([place])     Medications:  Scheduled:  . antiseptic oral rinse  7 mL Mouth Rinse BID  . antiseptic oral rinse  7 mL Mouth Rinse QID  . chlorhexidine gluconate  15 mL Mouth Rinse BID  . heparin subcutaneous  5,000 Units Subcutaneous Q12H  . ipratropium-albuterol  3 mL Nebulization Q6H  . lactulose  20 g Per Tube BID  . levothyroxine  100 mcg Per Tube Q0600  . pantoprazole sodium  40 mg Per Tube Q1200  . potassium chloride  10 mEq Intravenous Q1 Hr x 3  . rifaximin  550 mg Per Tube BID  . sevelamer carbonate  1,600 mg Oral TID WC  . sodium chloride  3 mL Intravenous Q12H   Infusions:  . sodium chloride 50 mL/hr at 05/17/15 0502  . phenylephrine (NEO-SYNEPHRINE) Adult infusion 130 mcg/min (05/17/15 0531)  . propofol (DIPRIVAN) infusion 20.08 mcg/kg/min (05/17/15 0502)    Assessment: Pharmacy consulted  to assist in managing electrolytes in this 64 y/o F with ARF currently requiring HD.  K= 3.3  Plan:  Potassium 3.4, ordered 10 mEq IV Q1H x 3 doses. Nephrology is following for increase serum creatinine with good urine output - per note could be related to hypotension. Will recheck electrolytes with AM labs.   Laural Benes, Pharm.D., BCPS Clinical Pharmacist 05/17/2015,6:03 AM

## 2015-05-17 NOTE — Progress Notes (Signed)
Spoke with pt.'s daughter Donella Stade, verified password and updated her on the pt.'s status.  Daughter was asking about what time dialysis might occur tomorrow as she lives an hour away and wants to come see the pt.   Told daughter that I was unable to verify that the pt. Would be having dialysis tomorrow after looking through the notes from the day, but that as this was a daily evaluation taking into account the pt.'s labs that might change.  I also made the daughter aware that the timing would be dependent on a number of factors.   Placed daughters number in epic and on board in room, she requested to be called in the event pt. Is having dialysis and preferably the timing. I suggested the pt. Call as well before driving the hour to verify with the AM nurse as day shift might not have time to call. Daughter amenable.

## 2015-05-18 ENCOUNTER — Inpatient Hospital Stay: Payer: BLUE CROSS/BLUE SHIELD

## 2015-05-18 DIAGNOSIS — I441 Atrioventricular block, second degree: Secondary | ICD-10-CM

## 2015-05-18 LAB — HEPATIC FUNCTION PANEL
ALK PHOS: 49 U/L (ref 38–126)
ALT: 17 U/L (ref 14–54)
AST: 37 U/L (ref 15–41)
Albumin: 2.6 g/dL — ABNORMAL LOW (ref 3.5–5.0)
BILIRUBIN DIRECT: 0.4 mg/dL (ref 0.1–0.5)
BILIRUBIN INDIRECT: 0.9 mg/dL (ref 0.3–0.9)
BILIRUBIN TOTAL: 1.3 mg/dL — AB (ref 0.3–1.2)
Total Protein: 7.8 g/dL (ref 6.5–8.1)

## 2015-05-18 LAB — BASIC METABOLIC PANEL
Anion gap: 6 (ref 5–15)
Anion gap: 4 — ABNORMAL LOW (ref 5–15)
BUN: 34 mg/dL — AB (ref 6–20)
BUN: 38 mg/dL — ABNORMAL HIGH (ref 6–20)
CALCIUM: 8.2 mg/dL — AB (ref 8.9–10.3)
CHLORIDE: 104 mmol/L (ref 101–111)
CO2: 31 mmol/L (ref 22–32)
CO2: 29 mmol/L (ref 22–32)
CREATININE: 3.28 mg/dL — AB (ref 0.44–1.00)
Calcium: 8.5 mg/dL — ABNORMAL LOW (ref 8.9–10.3)
Chloride: 103 mmol/L (ref 101–111)
Creatinine, Ser: 3.2 mg/dL — ABNORMAL HIGH (ref 0.44–1.00)
GFR calc Af Amer: 17 mL/min — ABNORMAL LOW (ref >60)
GFR calc Af Amer: 16 mL/min — ABNORMAL LOW (ref >60)
GFR, EST NON AFRICAN AMERICAN: 14 mL/min — AB (ref >60)
GFR, EST NON AFRICAN AMERICAN: 14 mL/min — AB (ref >60)
GLUCOSE: 139 mg/dL — AB (ref 65–99)
Glucose, Bld: 125 mg/dL — ABNORMAL HIGH (ref 65–99)
POTASSIUM: 3.2 mmol/L — AB (ref 3.5–5.1)
Potassium: 3.7 mmol/L (ref 3.5–5.1)
SODIUM: 141 mmol/L (ref 135–145)
Sodium: 136 mmol/L (ref 135–145)

## 2015-05-18 LAB — CBC
HCT: 23.9 % — ABNORMAL LOW (ref 35.0–47.0)
Hemoglobin: 7.7 g/dL — ABNORMAL LOW (ref 12.0–16.0)
MCH: 29.9 pg (ref 26.0–34.0)
MCHC: 32.3 g/dL (ref 32.0–36.0)
MCV: 92.6 fL (ref 80.0–100.0)
PLATELETS: 155 10*3/uL (ref 150–440)
RBC: 2.58 MIL/uL — AB (ref 3.80–5.20)
RDW: 20.4 % — ABNORMAL HIGH (ref 11.5–14.5)
WBC: 11.8 10*3/uL — ABNORMAL HIGH (ref 3.6–11.0)

## 2015-05-18 LAB — GLUCOSE, CAPILLARY
Glucose-Capillary: 116 mg/dL — ABNORMAL HIGH (ref 65–99)
Glucose-Capillary: 122 mg/dL — ABNORMAL HIGH (ref 65–99)
Glucose-Capillary: 106 mg/dL — ABNORMAL HIGH (ref 65–99)
Glucose-Capillary: 143 mg/dL — ABNORMAL HIGH (ref 65–99)
Glucose-Capillary: 180 mg/dL — ABNORMAL HIGH (ref 65–99)
Glucose-Capillary: 180 mg/dL — ABNORMAL HIGH (ref 65–99)

## 2015-05-18 LAB — MAGNESIUM: MAGNESIUM: 2.2 mg/dL (ref 1.7–2.4)

## 2015-05-18 LAB — PHOSPHORUS: PHOSPHORUS: 3.6 mg/dL (ref 2.5–4.6)

## 2015-05-18 MED ORDER — POTASSIUM CHLORIDE 20 MEQ/15ML (10%) PO SOLN
40.0000 meq | Freq: Once | ORAL | Status: AC
  Administered 2015-05-18: 40 meq
  Filled 2015-05-18: qty 30

## 2015-05-18 MED ORDER — LACTULOSE 10 GM/15ML PO SOLN
20.0000 g | Freq: Every day | ORAL | Status: DC
  Administered 2015-05-19: 20 g
  Filled 2015-05-18: qty 30

## 2015-05-18 MED ORDER — VASOPRESSIN 20 UNIT/ML IV SOLN
0.0300 [IU]/min | INTRAVENOUS | Status: DC
  Administered 2015-05-18 – 2015-05-19 (×2): 0.03 [IU]/min via INTRAVENOUS
  Filled 2015-05-18 (×3): qty 2

## 2015-05-18 MED ORDER — PRO-STAT SUGAR FREE PO LIQD
30.0000 mL | Freq: Two times a day (BID) | ORAL | Status: DC
  Administered 2015-05-19: 30 mL via ORAL

## 2015-05-18 NOTE — Progress Notes (Signed)
Pharmacy Consult for Electrolyte Monitoring and Replacement  Allergies  Allergen Reactions  . Codeine Anaphylaxis  . Arava [Leflunomide] Other (See Comments)    Pt states that this medication causes liver damage.   . Atorvastatin Other (See Comments)    Pt states that this medication causes liver damage.   . Humira [Adalimumab] Other (See Comments)    Pt states that this medication causes liver damage.   . Methotrexate Derivatives Other (See Comments)    Pt states that this medication causes liver damage.   . Simvastatin Other (See Comments)    Pt states that this medication causes liver damage and joint pain.     Patient Measurements: Height: 5\' 4"  (162.6 cm) Weight: 201 lb 15.1 oz (91.6 kg) IBW/kg (Calculated) : 54.7  Vital Signs: Temp: 97.1 F (36.2 C) (12/26 0400) Temp Source: Axillary (12/26 0400) BP: 89/56 mmHg (12/26 0530) Pulse Rate: 38 (12/26 0530) Intake/Output from previous day: 12/25 0701 - 12/26 0700 In: 3260.7 [I.V.:2118.3; NG/GT:757.3; IV Piggyback:250] Out: [id number] [Urine:985; Stool:646] Intake/Output from this shift: Total I/O In: 1655.1 [I.V.:1050.1; Other:135; NG/GT:470] Out: 769 [Urine:543; Stool:226]  Labs:  Recent Labs  05/16/15 0527 05/17/15 0438 05/18/15 0449  WBC 12.9* 13.6* 11.8*  HGB 8.4* 7.2* 7.7*  HCT 28.0* 22.0* 23.9*  PLT 189 140* 155     Recent Labs  05/16/15 0527 05/17/15 0438 05/18/15 0449  NA 138 141 141  K 4.9 3.4* 3.2*  CL 97* 104 104  CO2 35* 30 31  GLUCOSE 186* 132* 125*  BUN 28* 34* 34*  CREATININE 3.65* 3.75* 3.20*  CALCIUM 8.9 8.2* 8.5*  MG 2.7* 2.2 2.2  PHOS 8.2* 3.9 3.6  PROT 8.8*  --   --   ALBUMIN 2.7*  --   --   AST 36  --   --   ALT 19  --   --   ALKPHOS 54  --   --   BILITOT 1.4*  --   --   TRIG 65  --   --    Estimated Creatinine Clearance: 20 mL/min (by C-G formula based on Cr of 3.2).    Recent Labs  05/17/15 2001 05/18/15 0048 05/18/15 0359  GLUCAP 122* 116* 122*    Medical  History: Past Medical History  Diagnosis Date  . Allergy   . GERD (gastroesophageal reflux disease)   . CAD (coronary artery disease)   . Hyperlipidemia   . Thyroid disease   . Osteoporosis   . Rheumatoid arthritis ([place])   . Esophageal varices ([place]) 09/22/2014    April, 2016-grade 2-3 varices  . Hypothyroidism   . Cirrhosis ([place])     Medications:  Scheduled:  . albumin human  12.5 g Intravenous Q1200  . antiseptic oral rinse  7 mL Mouth Rinse BID  . antiseptic oral rinse  7 mL Mouth Rinse QID  . chlorhexidine gluconate  15 mL Mouth Rinse BID  . feeding supplement (VITAL HIGH PROTEIN)  1,000 mL Per Tube Q24H  . free water  100 mL Per Tube 3 times per day  . heparin subcutaneous  5,000 Units Subcutaneous Q12H  . lactulose  20 g Per Tube BID  . levothyroxine  100 mcg Per Tube Q0600  . metoprolol tartrate  12.5 mg Per Tube BID  . pantoprazole sodium  40 mg Per Tube Q1200  . potassium chloride  40 mEq Per Tube Once  . rifaximin  550 mg Per Tube BID  . sevelamer carbonate  1,600 mg Oral  TID WC  . sodium chloride  3 mL Intravenous Q12H   Infusions:  . sodium chloride 50 mL/hr at 05/18/15 0500  . phenylephrine (NEO-SYNEPHRINE) Adult infusion 110.133 mcg/min (05/18/15 0500)  . propofol (DIPRIVAN) infusion 10.04 mcg/kg/min (05/18/15 0500)    Assessment: Pharmacy consulted to assist in managing electrolytes in this 64 y/o F with ARF currently requiring HD.  K= 3.3  Plan:  Potassium 3.2, Elink provider ordered 40 mEq po liquid x 1. Will recheck BMP this afternoon.   Laural Benes, Pharm.D., BCPS Clinical Pharmacist 05/18/2015,5:42 AM

## 2015-05-18 NOTE — Progress Notes (Signed)
[place] at [place] NAME: Jean Tucker    MR#:  [id number]  DATE OF BIRTH:  [birth year]    CHIEF COMPLAINT:   Chief Complaint  Patient presents with  . Shortness of Breath   Intubated, full ventilator support, agitated, has had tachycardia and bradycardia arrhythmias overnight  REVIEW OF SYSTEMS:   ROS unable to obtain  DRUG ALLERGIES:   Allergies  Allergen Reactions  . Codeine Anaphylaxis  . Arava [Leflunomide] Other (See Comments)    Pt states that this medication causes liver damage.   . Atorvastatin Other (See Comments)    Pt states that this medication causes liver damage.   . Humira [Adalimumab] Other (See Comments)    Pt states that this medication causes liver damage.   . Methotrexate Derivatives Other (See Comments)    Pt states that this medication causes liver damage.   . Simvastatin Other (See Comments)    Pt states that this medication causes liver damage and joint pain.     VITALS:  Blood pressure 128/61, pulse 61, temperature 98 F (36.7 C), temperature source Axillary, resp. rate 16, height 5\' 4"  (1.626 m), weight 91.6 kg (201 lb 15.1 oz), SpO2 95 %.  PHYSICAL EXAMINATION:  GENERAL:  64 y.o.-year-old patient  Critically ill LUNGS: intubated, coarse ventilated breath sounds, good air movement CARDIOVASCULAR: S1, S2 normal. No murmurs, rubs, or gallops. Tachycardic, irregular ABDOMEN: Soft, nontender, nondistended. Bowel sounds present. No organomegaly or mass.  EXTREMITIES: No cyanosis, or clubbing. +1 bilateral pedal edema NEUROLOGIC: Moving all 4 extremities, no focal defects PSYCHIATRIC: Agitated, does not follow commands SKIN: No obvious rash, lesion, or ulcer.   LABORATORY PANEL:   CBC  Recent Labs Lab 05/18/15 0449  WBC 11.8*  HGB 7.7*  HCT 23.9*  PLT 155   ------------------------------------------------------------------------------------------------------------------  Chemistries    Recent Labs Lab 05/16/15 0527  05/18/15 0449 05/18/15 1206  NA 138  < > 141 136  K 4.9  < > 3.2* 3.7  CL 97*  < > 104 103  CO2 35*  < > 31 29  GLUCOSE 186*  < > 125* 139*  BUN 28*  < > 34* 38*  CREATININE 3.65*  < > 3.20* 3.28*  CALCIUM 8.9  < > 8.5* 8.2*  MG 2.7*  < > 2.2  --   AST 36  --   --   --   ALT 19  --   --   --   ALKPHOS 54  --   --   --   BILITOT 1.4*  --   --   --   < > = values in this interval not displayed. ------------------------------------------------------------------------------------------------------------------  Cardiac Enzymes No results for input(s): TROPONINI in the last 168 hours. ------------------------------------------------------------------------------------------------------------------  RADIOLOGY:  Dg Chest Port 1 View  05/18/2015  CLINICAL DATA:  Respiratory failure EXAM: PORTABLE CHEST 1 VIEW COMPARISON:  05/17/2015 FINDINGS: Cardiomegaly again noted. Stable NG tube position. Endotracheal tube in place with tip 2.8 cm above the carina. Again noted streaky bilateral interstitial prominence and airspace opacities consistent with edema or bilateral pneumonia. There is no pneumothorax. IMPRESSION: . Stable NG tube position. Endotracheal tube in place. No pneumothorax. Again noted bilateral infiltrates or pulmonary edema. Electronically Signed   By: Lahoma Crocker M.D.   On: 05/18/2015 09:54   Dg Chest Port 1 View  05/17/2015  CLINICAL DATA:  Respiratory failure EXAM: PORTABLE CHEST - 1 VIEW COMPARISON:  the previous day's study  FINDINGS: The endotracheal tube tip is approximately 1 cm above the carina. A nasogastric tube extends at least as far as the stomach, tip not seen. Patchy interstitial and airspace opacities, left greater than right age, slightly increased since previous exam. Cardiomegaly stable. Suspect layering pleural effusions. Orthopedic anchors in the rib right humeral head are noted. IMPRESSION: 1. Low position of endotracheal tube,  less than 1 cm above carina. 2. Worsening asymmetric infiltrates or edema, left greater than right. 3. Probable layering pleural effusions. Electronically Signed   By: Lucrezia Europe M.D.   On: 05/17/2015 09:45    EKG:   Orders placed or performed during the hospital encounter of 04/28/2015  . EKG 12-Lead  . EKG 12-Lead  . ED EKG  . ED EKG  . EKG 12-Lead  . EKG 12-Lead  . EKG 12-Lead  . 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 She was initially admitted at [facility] on 12/1 for hematemesis due to ruptured esophageal varices. She was transferred to [facility] on 12/2 and had variceal banding and TIPS procedure on 12/3. Her hospitalization at [facility] was compensated by severe hepatic encephalopathy and also renal failure thought to be due to ATN after contrast exposure, hypovolemia in the setting of blood loss and possibly also hepatorenal syndrome. She left [facility] AMA on 12/15 and presented to [facility] on 12/16 with respiratory distress.  1. 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 Hypothyroidism continue Synthroid.  5 history of rheumatoid arthritis: Intolerant to methotrexate, Humira, ARava  6. Atrial fibrillation: Has had tachycardia and bradycardia arrhythmias overnight - Cardiology consultation is pending for  12/26 - Continue metoprolol  7. Hypotension - Continue pressor support and IV fluids  Family has requested transfer to [place]. I have reviewed her [facility] and [facility] charts with Dr. Ruthann Cancer intensivist at [facility]. There are no ICU beds at [facility], he has not accepted the patient, he does not feel that he would change current management. Husband updated.  CODE STATUS: Full code. Discussed again with the patient's husband today. For now he wants full code and aggressive treatment. He did say that she would not want to be on prolonged life support, would not want ventilator more than 10 days.   All the records are reviewed and case discussed with Care Management/Social Workerr. Management plans discussed with the patient, family and they are in agreement.  CODE STATUS: full  TOTAL TIME TAKING CARE OF THIS PATIENT 35 minutes.   POSSIBLE D/C IN ? DAYS, DEPENDING ON CLINICAL CONDITION.   Myrtis Ser M.D on 05/18/2015 at 4:02 PM  Between 7am to 6pm - Pager - [phone]  After 6pm go to [email or web address] - password [email or web address] [place] [facility]  Office  [phone]  CC: Primary care physician; Loura Pardon, MD   Note: This dictation was prepared with Dragon dictation along with  smaller phrase technology. Any transcriptional errors that result from this process are unintentional.

## 2015-05-18 NOTE — Progress Notes (Signed)
PULM/CCM NOTE   MAJOR EVENTS/TEST RESULTS: 12/16 Admitted to ICU /SDU with dyspnea, dx of PNA, AKI. Intermittent BiPAP 12/18 transferred out of ICU 12/19 Pt transferred back to ICU/SDU on BiPAP. Resp status improved after HD. Severe delirium after Ambien 12/19 PM 12/22 Back to baseline mentation. PCCM signed off.  12/23 transferred out of ICU /SDU 12/24 returned to ICU with severe hypercarbic respiratory failure requiring intubation 12/25 AFRVR. NE changed to phenylephrine. Low dose metoprolol initiated 12/26 2nd degree HB. PAF persists. Sinus bradycardia. Cards consult requested. Little progress. Palliative Care consultation requested  INDWELLING DEVICES:: R fem HD cath 12/17 >>  ETT 12/24 >>   MICRO DATA: Blood 12/16 >> NEG Urine 12/16 >> 50k colonies candida  ANTIMICROBIALS:    SUBJ: RASS -2, not F/C. On propofol. On low dose norepinephrine  Filed Vitals:   05/18/15 0900 05/18/15 0959 05/18/15 1000 05/18/15 1100  BP: 119/69 90/47 96/54  95/41  Pulse: 63 63 62 62  Temp:      TempSrc:      Resp: 15  16 16   Height:      Weight:      SpO2: 97%  96% 96%   Intubated, sedated HEENT WNL JVP not visualized No wheezes Loletha Grayer, reg, no M NABS, soft, NT No LE edema  BMP Latest Ref Rng 05/18/2015 05/17/2015 05/16/2015  Glucose 65 - 99 mg/dL 125(H) 132(H) 186(H)  BUN 6 - 20 mg/dL 34(H) 34(H) 28(H)  Creatinine 0.44 - 1.00 mg/dL 3.20(H) 3.75(H) 3.65(H)  Sodium 135 - 145 mmol/L 141 141 138  Potassium 3.5 - 5.1 mmol/L 3.2(L) 3.4(L) 4.9  Chloride 101 - 111 mmol/L 104 104 97(L)  CO2 22 - 32 mmol/L 31 30 35(H)  Calcium 8.9 - 10.3 mg/dL 8.5(L) 8.2(L) 8.9    CBC Latest Ref Rng 05/18/2015 05/17/2015 05/16/2015  WBC 3.6 - 11.0 K/uL 11.8(H) 13.6(H) 12.9(H)  Hemoglobin 12.0 - 16.0 g/dL 7.7(L) 7.2(L) 8.4(L)  Hematocrit 35.0 - 47.0 % 23.9(L) 22.0(L) 28.0(L)  Platelets 150 - 440 K/uL 155 140(L) 189    CXR: edema pattern  IMPRESSION: 1) Recurrent acute on chronic hypercarbic  and  hypoxic respiratory failure with bilateral pulmonary infiltrates   Suspect component of impaired ventilatory drive 2) PAF <> NSR <> sinus brady <> Mobitz II 3) Hypotension - increasing vasopressor requirements 4) AKI on CKD, nonoliguric. Cr improved 5) Autoimmune hepatitis/cirrhosis - was on transplant list prior to this illness 6) Anemia of critical illness 7) Toxic-metabolic encephalopathy, ICU acquired delirium 8) hypothyroidism, TSH 3.5 12/25 9) poor prognosis  PLAN/REC: Cont vent support - settings reviewed and/or adjusted Cont vent bundle Daily SBT if/when meets criteria Cont low dose metoprolol  Cont phenylephrine to maintain MAP > 65 mmHg Add vasopressin 12/26 Monitor BMET intermittently Monitor I/Os Correct electrolytes as indicated Renal following. No HD planned 12/26 Monitor temp, WBC count Micro and abx as above DVT px: SCDs Monitor CBC intermittently Transfuse per usual guidelines   Husband updated @ bedside. Guarded prognosis again conveyed  CCM Time - 30 mins  Merton Border, MD PCCM service Mobile [phone] Pager [phone]

## 2015-05-18 NOTE — Progress Notes (Signed)
Gastric Residual of 30ml

## 2015-05-18 NOTE — Progress Notes (Signed)
Nutrition Follow-up      INTERVENTION:  EN: Consulted via adult tube feeding protocol. Recommend continuing vital high protein at 44ml/hr, goal rate and adding prostat times 2 to better meet protein needs.  Will provide 1160 kcals (diprivan providing additional 145 kcals/d), 114 g of protein and 815ml free water in tube feeding.     NUTRITION DIAGNOSIS:   Inadequate oral intake related to acute illness as evidenced by per patient/family report.    GOAL:   Patient will meet greater than or equal to 90% of their needs    MONITOR:    (Energy intake, Electrolyte and renal profile)  REASON FOR ASSESSMENT:   Consult Poor PO  ASSESSMENT:      Pt intubated on 12/24, planning HD today   Current Nutrition: tolerating vital high protein at 2ml/hr at this time   Gastrointestinal Profile: Last BM: liquid diarrhea, but receiving lactulose   Scheduled Medications:  . albumin human  12.5 g Intravenous Q1200  . antiseptic oral rinse  7 mL Mouth Rinse BID  . antiseptic oral rinse  7 mL Mouth Rinse QID  . chlorhexidine gluconate  15 mL Mouth Rinse BID  . feeding supplement (VITAL HIGH PROTEIN)  1,000 mL Per Tube Q24H  . free water  100 mL Per Tube 3 times per day  . heparin subcutaneous  5,000 Units Subcutaneous Q12H  . [START ON 05/19/2015] lactulose  20 g Per Tube Daily  . levothyroxine  100 mcg Per Tube Q0600  . metoprolol tartrate  12.5 mg Per Tube BID  . pantoprazole sodium  40 mg Per Tube Q1200  . rifaximin  550 mg Per Tube BID  . sevelamer carbonate  1,600 mg Oral TID WC  . sodium chloride  3 mL Intravenous Q12H    Continuous Medications:  . phenylephrine (NEO-SYNEPHRINE) Adult infusion 160 mcg/min (05/18/15 1205)  . propofol (DIPRIVAN) infusion 10 mcg/kg/min (05/18/15 0937)  . vasopressin (PITRESSIN) infusion - *FOR SHOCK* 0.03 Units/min (05/18/15 1207)   Diprivan providing 145 kcals per day at rate of 5.35ml/hr  Electrolyte/Renal Profile and Glucose Profile:    Recent Labs Lab 05/16/15 0527 05/17/15 0438 05/18/15 0449  NA 138 141 141  K 4.9 3.4* 3.2*  CL 97* 104 104  CO2 35* 30 31  BUN 28* 34* 34*  CREATININE 3.65* 3.75* 3.20*  CALCIUM 8.9 8.2* 8.5*  MG 2.7* 2.2 2.2  PHOS 8.2* 3.9 3.6  GLUCOSE 186* 132* 125*   Protein Profile:  Recent Labs Lab 05/13/15 0418 05/15/15 0528 05/16/15 0527  ALBUMIN 2.5* 2.6* 2.7*       Weight Trend since Admission: Filed Weights   05/15/15 0455 05/17/15 0429 05/18/15 0435  Weight: 201 lb 3.2 oz (91.264 kg) 197 lb 1.5 oz (89.4 kg) 201 lb 15.1 oz (91.6 kg)      Diet Order:   NPO  Skin:   reviewed   Height:   Ht Readings from Last 1 Encounters:  05/11/15 5\' 4"  (1.626 m)    Weight:   Wt Readings from Last 1 Encounters:  05/18/15 201 lb 15.1 oz (91.6 kg)    Ideal Body Weight:     BMI:  Body mass index is 34.65 kg/(m^2).  Estimated Nutritional Needs:   Kcal:  (11-14 kcals/kg) Using 92kg.  [id number] kcals/d.  Protein:  (2.0-2.5 g/kg) Using IBW of 55kg 110-138 g/d   Fluid:  1044ml +UOP  EDUCATION NEEDS:   No education needs identified at this time  HIGH Care Level  Gearlene Godsil B.  Zenia Resides, [place], [place] (pager) Weekend/On-Call pager [phone])

## 2015-05-18 NOTE — Progress Notes (Signed)
[facility] Progress Note Patient Name: Jean Tucker DOB: 10-Jun-1950 MRN: [id number]   Date of Service  05/18/2015  HPI/Events of Note  Hypokalemia in the setting of renal insufficiency  eICU Interventions  Potassium replaced     Intervention Category Intermediate Interventions: Electrolyte abnormality - evaluation and management  DETERDING,ELIZABETH 05/18/2015, 5:37 AM

## 2015-05-18 NOTE — Progress Notes (Signed)
Zero gastric residual.

## 2015-05-18 NOTE — Progress Notes (Addendum)
Clinical Social Worker (CSW) is following patient and reviewed chart. Per MD note patient is on ventilator and is intubated. Palliative care consult ordered. CSW will continue to follow and assist as needed.   Blima Rich, [place] [phone]

## 2015-05-18 NOTE — Progress Notes (Signed)
PT Hold Note  Patient Details Name: Jean Tucker MRN: [id number] DOB: 1950/05/28   Cancelled Treatment:    Reason Eval/Treat Not Completed: Medical issues which prohibited therapy. Pt has declined in status and transferred to CCU. No continue on transfer or new order received with decline in status. If PT is desired/needed will need new order or continue on transfer modification. RN notified.   Lyndel Safe Huprich PT, DPT   Huprich,Jason 05/18/2015, 2:34 PM

## 2015-05-18 NOTE — Progress Notes (Signed)
Pharmacy Consult for Electrolyte Monitoring and Replacement  Allergies  Allergen Reactions  . Codeine Anaphylaxis  . Arava [Leflunomide] Other (See Comments)    Pt states that this medication causes liver damage.   . Atorvastatin Other (See Comments)    Pt states that this medication causes liver damage.   . Humira [Adalimumab] Other (See Comments)    Pt states that this medication causes liver damage.   . Methotrexate Derivatives Other (See Comments)    Pt states that this medication causes liver damage.   . Simvastatin Other (See Comments)    Pt states that this medication causes liver damage and joint pain.     Patient Measurements: Height: 5\' 4"  (162.6 cm) Weight: 201 lb 15.1 oz (91.6 kg) IBW/kg (Calculated) : 54.7  Vital Signs: Temp: 98 F (36.7 C) (12/26 1200) Temp Source: Axillary (12/26 0800) BP: 123/61 mmHg (12/26 1300) Pulse Rate: 61 (12/26 1300) Intake/Output from previous day: 12/25 0701 - 12/26 0700 In: 3547.5 [I.V.:2265.1; NG/GT:897.3; IV Piggyback:250] Out: [id number] [Urine:1050; Stool:696] Intake/Output from this shift: Total I/O In: 1017.2 [I.V.:647.2; NG/GT:320; IV Piggyback:50] Out: 385 [Urine:85; Stool:300]  Labs:  Recent Labs  05/16/15 0527 05/17/15 0438 05/18/15 0449  WBC 12.9* 13.6* 11.8*  HGB 8.4* 7.2* 7.7*  HCT 28.0* 22.0* 23.9*  PLT 189 140* 155     Recent Labs  05/16/15 0527 05/17/15 0438 05/18/15 0449 05/18/15 1206  NA 138 141 141 136  K 4.9 3.4* 3.2* 3.7  CL 97* 104 104 103  CO2 35* 30 31 29   GLUCOSE 186* 132* 125* 139*  BUN 28* 34* 34* 38*  CREATININE 3.65* 3.75* 3.20* 3.28*  CALCIUM 8.9 8.2* 8.5* 8.2*  MG 2.7* 2.2 2.2  --   PHOS 8.2* 3.9 3.6  --   PROT 8.8*  --   --   --   ALBUMIN 2.7*  --   --   --   AST 36  --   --   --   ALT 19  --   --   --   ALKPHOS 54  --   --   --   BILITOT 1.4*  --   --   --   TRIG 65  --   --   --    Estimated Creatinine Clearance: 19.5 mL/min (by C-G formula based on Cr of 3.28).     Recent Labs  05/18/15 0359 05/18/15 0720 05/18/15 1133  GLUCAP 122* 106* 143*    Medical History: Past Medical History  Diagnosis Date  . Allergy   . GERD (gastroesophageal reflux disease)   . CAD (coronary artery disease)   . Hyperlipidemia   . Thyroid disease   . Osteoporosis   . Rheumatoid arthritis ([place])   . Esophageal varices ([place]) 09/22/2014    April, 2016-grade 2-3 varices  . Hypothyroidism   . Cirrhosis ([place])     Medications:  Scheduled:  . albumin human  12.5 g Intravenous Q1200  . antiseptic oral rinse  7 mL Mouth Rinse BID  . antiseptic oral rinse  7 mL Mouth Rinse QID  . chlorhexidine gluconate  15 mL Mouth Rinse BID  . feeding supplement (PRO-STAT SUGAR FREE 64)  30 mL Oral BID  . feeding supplement (VITAL HIGH PROTEIN)  1,000 mL Per Tube Q24H  . free water  100 mL Per Tube 3 times per day  . heparin subcutaneous  5,000 Units Subcutaneous Q12H  . [START ON 05/19/2015] lactulose  20 g Per Tube Daily  .  levothyroxine  100 mcg Per Tube Q0600  . metoprolol tartrate  12.5 mg Per Tube BID  . pantoprazole sodium  40 mg Per Tube Q1200  . rifaximin  550 mg Per Tube BID  . sevelamer carbonate  1,600 mg Oral TID WC  . sodium chloride  3 mL Intravenous Q12H   Infusions:  . phenylephrine (NEO-SYNEPHRINE) Adult infusion 150 mcg/min (05/18/15 1300)  . propofol (DIPRIVAN) infusion 10 mcg/kg/min (05/18/15 0937)  . vasopressin (PITRESSIN) infusion - *FOR SHOCK* 0.03 Units/min (05/18/15 1207)    Assessment: Pharmacy consulted to assist in managing electrolytes in this 64 y/o F with ARF currently requiring HD.  K= 3.3  Plan:  Potassium 3.2, Elink provider ordered 40 mEq po liquid x 1. Will recheck BMP this afternoon.   12/26 1200 K+ = 3.7. Will recheck with am labs  Kyngston Pickelsimer A, Pharm.D., BCPS Clinical Pharmacist 05/18/2015,1:41 PM

## 2015-05-18 NOTE — Progress Notes (Signed)
[facility]  ROUNDING NOTE   Subjective:   Patient was found to be unresponsive in her bed therefore, She was transferred to the ICU, ABG shows severe CO2 retention, She was intubated 12/24 She is requiring pressor support Serum creatinine got worse to 3.75; starting to improve today to 3.20; UOP 1050 cc Phosphorus has improved to 3.6    Objective:  Vital signs in last 24 hours:  Temp:  [96.8 F (36 C)-98.3 F (36.8 C)] 97.6 F (36.4 C) (12/26 0800) Pulse Rate:  [38-144] 63 (12/26 0900) Resp:  [0-20] 15 (12/26 0900) BP: (70-132)/(41-99) 119/69 mmHg (12/26 0900) SpO2:  [94 %-100 %] 97 % (12/26 0900) FiO2 (%):  [35 %] 35 % (12/26 0730) Weight:  [91.6 kg (201 lb 15.1 oz)] 91.6 kg (201 lb 15.1 oz) (12/26 0435)  Weight change: 2.2 kg (4 lb 13.6 oz) Filed Weights   05/15/15 0455 05/17/15 0429 05/18/15 0435  Weight: 91.264 kg (201 lb 3.2 oz) 89.4 kg (197 lb 1.5 oz) 91.6 kg (201 lb 15.1 oz)    Intake/Output: I/O last 3 completed shifts: In: 5876.8 [I.V.:3644.5; Other:135; NG/GT:997.3; IV Piggyback:1100] Out: 1999 [Urine:1303; Stool:696]   Intake/Output this shift:  Total I/O In: 180 [I.V.:100; NG/GT:80] Out: 35 [Urine:35]  Physical Exam: General:  critically ill   HENT  ET tube in place   Eyes: Anicteric,   Neck: Supple,   Lungs:  Vent assisted  Heart: irregular rate and rhythm, A Fib  Abdomen:  Soft, nontender, mildly distended  Extremities: 1+ dependent and peripheral edema.  Neurologic: Sedated at present  Skin: No lesions  Access: Right femoral vascath 12/16 Dr. Stevenson Clinch. Day 9  Foley  Basic Metabolic Panel:  Recent Labs Lab 05/14/15 0520 05/15/15 0528 05/16/15 0527 05/17/15 0438 05/18/15 0449  NA 141 138 138 141 141  K 3.7 3.8 4.9 3.4* 3.2*  CL 101 99* 97* 104 104  CO2 32 34* 35* 30 31  GLUCOSE 132* 196* 186* 132* 125*  BUN 29* 28* 28* 34* 34*  CREATININE 3.53* 3.55* 3.65* 3.75* 3.20*  CALCIUM 8.6* 8.7* 8.9 8.2* 8.5*  MG 1.7 1.6* 2.7*  2.2 2.2  PHOS 4.6 5.5* 8.2* 3.9 3.6    Liver Function Tests:  Recent Labs Lab 05/13/15 0418 05/15/15 0528 05/16/15 0527  AST 24 33 36  ALT 13* 16 19  ALKPHOS 55 49 54  BILITOT 1.2 1.1 1.4*  PROT 7.9 8.1 8.8*  ALBUMIN 2.5* 2.6* 2.7*   No results for input(s): LIPASE, AMYLASE in the last 168 hours.  Recent Labs Lab 05/15/15 0528 05/15/15 1359  AMMONIA 47* 38*    CBC:  Recent Labs Lab 05/14/15 0520 05/15/15 0528 05/16/15 0527 05/17/15 0438 05/18/15 0449  WBC 9.9 10.9 12.9* 13.6* 11.8*  HGB 8.3* 8.6* 8.4* 7.2* 7.7*  HCT 26.3* 26.8* 28.0* 22.0* 23.9*  MCV 92.3 94.0 96.9 91.4 92.6  PLT 158 160 189 140* 155    Cardiac Enzymes: No results for input(s): CKTOTAL, CKMB, CKMBINDEX, TROPONINI in the last 168 hours.  BNP: Invalid input(s): POCBNP  CBG:  Recent Labs Lab 05/17/15 0501 05/17/15 2001 05/18/15 0048 05/18/15 0359 05/18/15 0720  GLUCAP 108* 122* 116* 122* 106*    Microbiology: Results for orders placed or performed during the hospital encounter of 04/24/2015  Culture, blood (Routine X 2) w Reflex to ID Panel     Status: None   Collection Time: 05/07/2015  2:18 AM  Result Value Ref Range Status   Specimen Description BLOOD LEFT ASSIST  CONTROL  Final   Special Requests BOTTLES DRAWN AEROBIC AND ANAEROBIC 5 CC  Final   Culture NO GROWTH 5 DAYS  Final   Report Status 05/13/2015 FINAL  Final  Culture, blood (Routine X 2) w Reflex to ID Panel     Status: None   Collection Time: 05/10/2015  2:18 AM  Result Value Ref Range Status   Specimen Description BLOOD RIGHT ASSIST CONTROL  Final   Special Requests BOTTLES DRAWN AEROBIC AND ANAEROBIC 1 CC  Final   Culture NO GROWTH 5 DAYS  Final   Report Status 05/13/2015 FINAL  Final  Urine culture     Status: None   Collection Time: 05/22/2015  6:05 AM  Result Value Ref Range Status   Specimen Description URINE, RANDOM  Final   Special Requests NONE  Final   Culture 50,000 COLONIES/mL CANDIDA ALBICANS  Final    Report Status 05/11/2015 FINAL  Final  C difficile quick scan w PCR reflex     Status: None   Collection Time: 05/17/15  9:13 AM  Result Value Ref Range Status   C Diff antigen NEGATIVE NEGATIVE Final   C Diff toxin NEGATIVE NEGATIVE Final   C Diff interpretation Negative for C. difficile  Final    Coagulation Studies: No results for input(s): LABPROT, INR in the last 72 hours.  Urinalysis: No results for input(s): COLORURINE, LABSPEC, PHURINE, GLUCOSEU, HGBUR, BILIRUBINUR, KETONESUR, PROTEINUR, UROBILINOGEN, NITRITE, LEUKOCYTESUR in the last 72 hours.  Invalid input(s): APPERANCEUR    Imaging: Dg Chest Port 1 View  05/17/2015  CLINICAL DATA:  Respiratory failure EXAM: PORTABLE CHEST - 1 VIEW COMPARISON:  the previous day's study FINDINGS: The endotracheal tube tip is approximately 1 cm above the carina. A nasogastric tube extends at least as far as the stomach, tip not seen. Patchy interstitial and airspace opacities, left greater than right age, slightly increased since previous exam. Cardiomegaly stable. Suspect layering pleural effusions. Orthopedic anchors in the rib right humeral head are noted. IMPRESSION: 1. Low position of endotracheal tube, less than 1 cm above carina. 2. Worsening asymmetric infiltrates or edema, left greater than right. 3. Probable layering pleural effusions. Electronically Signed   By: Lucrezia Europe M.D.   On: 05/17/2015 09:45     Medications:   . sodium chloride 50 mL/hr at 05/18/15 0823  . phenylephrine (NEO-SYNEPHRINE) Adult infusion 160 mcg/min (05/18/15 0823)  . propofol (DIPRIVAN) infusion 10 mcg/kg/min (05/18/15 0937)   . albumin human  12.5 g Intravenous Q1200  . antiseptic oral rinse  7 mL Mouth Rinse BID  . antiseptic oral rinse  7 mL Mouth Rinse QID  . chlorhexidine gluconate  15 mL Mouth Rinse BID  . feeding supplement (VITAL HIGH PROTEIN)  1,000 mL Per Tube Q24H  . free water  100 mL Per Tube 3 times per day  . heparin subcutaneous  5,000  Units Subcutaneous Q12H  . lactulose  20 g Per Tube BID  . levothyroxine  100 mcg Per Tube Q0600  . metoprolol tartrate  12.5 mg Per Tube BID  . pantoprazole sodium  40 mg Per Tube Q1200  . rifaximin  550 mg Per Tube BID  . sevelamer carbonate  1,600 mg Oral TID WC  . sodium chloride  3 mL Intravenous Q12H   acetaminophen, alteplase, fentaNYL (SUBLIMAZE) injection, heparin, ipratropium-albuterol, lidocaine (PF), lidocaine-prilocaine, metoprolol, midazolam, ondansetron **OR** ondansetron (ZOFRAN) IV, pentafluoroprop-tetrafluoroeth  Assessment/ Plan:  Jean Tucker is a 64 y.o. 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  1. Acute renal failure:likely ATN after TIPS procedure. Hemodialysis 12/16 due to worsening renal function.   Baseline creatinine of 0.85 from 12/2 Electrolytes and Volume status are acceptable. S Cr is starting to improve and UOP >1000 cc No acute indication for Dialysis at present  Continue hemodynamic support Monitor UOP closely.  2. 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  - rifaximin and lactulose for hepatic encephalopathy  3. Acute resp failure from Pneumonia and Acute pulmonary edema:  - intubated again on dec 24 - pleural effusions -  iv albumin for oncotic support; ordered 3 doses; may continue prn  4. Generalized edema Improved with dialysis and lasix drip previously At present, goal is to preserve intravascular volume and to keep i/o even or net neg  5. Somnolence - ? Related to klonopin - ABG showed pCO@ 147 suggesting severe CO2 retention - vent dependent at present    LOS: 10 Noreta Kue 12/26/20169:38 AM

## 2015-05-18 NOTE — Plan of Care (Signed)
Problem: Phase I Progression Outcomes Goal: VTE prophylaxis Outcome: Progressing TEDS & heparin SQ Goal: Oral Care per Protocol Outcome: Progressing Q2 oral care    Goal: Sedation Protocol initiated if indicated Outcome: Progressing Propofol gtt w/ fentanyl PRN pushes Goal: Initiate hyperglycemia protocol as indicated Outcome: Not Applicable Date Met:  03/70/48 No Hx of DM, pt. Has not been hyperglycemic Goal: Voiding-avoid urinary catheter unless indicated Outcome: Not Progressing Foley

## 2015-05-19 ENCOUNTER — Inpatient Hospital Stay: Payer: BLUE CROSS/BLUE SHIELD

## 2015-05-19 DIAGNOSIS — Z9911 Dependence on respirator [ventilator] status: Secondary | ICD-10-CM

## 2015-05-19 DIAGNOSIS — R410 Disorientation, unspecified: Secondary | ICD-10-CM

## 2015-05-19 DIAGNOSIS — R579 Shock, unspecified: Secondary | ICD-10-CM

## 2015-05-19 DIAGNOSIS — G9340 Encephalopathy, unspecified: Secondary | ICD-10-CM | POA: Insufficient documentation

## 2015-05-19 DIAGNOSIS — N189 Chronic kidney disease, unspecified: Secondary | ICD-10-CM

## 2015-05-19 DIAGNOSIS — K7030 Alcoholic cirrhosis of liver without ascites: Secondary | ICD-10-CM

## 2015-05-19 DIAGNOSIS — Z992 Dependence on renal dialysis: Secondary | ICD-10-CM

## 2015-05-19 LAB — COMPREHENSIVE METABOLIC PANEL
ALK PHOS: 48 U/L (ref 38–126)
ALT: 16 U/L (ref 14–54)
AST: 33 U/L (ref 15–41)
Albumin: 2.4 g/dL — ABNORMAL LOW (ref 3.5–5.0)
Anion gap: 6 (ref 5–15)
BUN: 41 mg/dL — AB (ref 6–20)
CALCIUM: 8.2 mg/dL — AB (ref 8.9–10.3)
CHLORIDE: 101 mmol/L (ref 101–111)
CO2: 29 mmol/L (ref 22–32)
CREATININE: 2.99 mg/dL — AB (ref 0.44–1.00)
GFR calc Af Amer: 18 mL/min — ABNORMAL LOW (ref >60)
GFR calc non Af Amer: 16 mL/min — ABNORMAL LOW (ref >60)
Glucose, Bld: 168 mg/dL — ABNORMAL HIGH (ref 65–99)
Potassium: 3.6 mmol/L (ref 3.5–5.1)
SODIUM: 136 mmol/L (ref 135–145)
Total Bilirubin: 1.3 mg/dL — ABNORMAL HIGH (ref 0.3–1.2)
Total Protein: 7.8 g/dL (ref 6.5–8.1)

## 2015-05-19 LAB — CBC
HCT: 23.1 % — ABNORMAL LOW (ref 35.0–47.0)
HEMOGLOBIN: 7.4 g/dL — AB (ref 12.0–16.0)
MCH: 29.2 pg (ref 26.0–34.0)
MCHC: 32 g/dL (ref 32.0–36.0)
MCV: 91.2 fL (ref 80.0–100.0)
PLATELETS: 144 10*3/uL — AB (ref 150–440)
RBC: 2.54 MIL/uL — AB (ref 3.80–5.20)
RDW: 20.4 % — ABNORMAL HIGH (ref 11.5–14.5)
WBC: 12.3 10*3/uL — AB (ref 3.6–11.0)

## 2015-05-19 LAB — GLUCOSE, CAPILLARY
Glucose-Capillary: 139 mg/dL — ABNORMAL HIGH (ref 65–99)
Glucose-Capillary: 141 mg/dL — ABNORMAL HIGH (ref 65–99)
Glucose-Capillary: 146 mg/dL — ABNORMAL HIGH (ref 65–99)
Glucose-Capillary: 152 mg/dL — ABNORMAL HIGH (ref 65–99)
Glucose-Capillary: 152 mg/dL — ABNORMAL HIGH (ref 65–99)
Glucose-Capillary: 165 mg/dL — ABNORMAL HIGH (ref 65–99)

## 2015-05-19 LAB — TRIGLYCERIDES: Triglycerides: 56 mg/dL (ref <150)

## 2015-05-19 LAB — AMMONIA: Ammonia: 32 umol/L (ref 9–35)

## 2015-05-19 MED ORDER — DEXMEDETOMIDINE HCL IN NACL 400 MCG/100ML IV SOLN
0.4000 ug/kg/h | INTRAVENOUS | Status: DC
  Administered 2015-05-19: 0.5 ug/kg/h via INTRAVENOUS
  Administered 2015-05-19: 0.4 ug/kg/h via INTRAVENOUS
  Filled 2015-05-19 (×2): qty 100

## 2015-05-19 MED ORDER — SEVELAMER CARBONATE 0.8 G PO PACK
1.6000 g | PACK | Freq: Three times a day (TID) | ORAL | Status: DC
  Administered 2015-05-19: 1.6 g via ORAL
  Filled 2015-05-19: qty 2

## 2015-05-19 NOTE — Progress Notes (Signed)
Gastric residual 200 ml. Refed and flushed.

## 2015-05-19 NOTE — Consult Note (Signed)
Palliative Medicine Inpatient Consult Note   Name: Jean Tucker Date: 05/19/2015 MRN: [id number]  DOB: [birth year]  Referring Physician: Nicholes Mango, MD  Palliative Care consult requested for this 64 y.o. female for goals of medical therapy in patient with cirrhosis and complications as detailed below under Impression. Family has asked for transfer to [facility] but there are no beds available and [facility] intensivist did not accept pt there.     TODAY'S DISCUSSIONS AND DECISIONS:  I met with the pt's husband, Jean Tucker,  and daughter, [name].  I discussed approaches to dealing with pts situation and day to day events and problems and small victories.  The husband seems to have a perfectly reasonable approach that is based in hope.  However, he recognizes the negatives and knows that she may not survive and may not recover to baseline.  He is informed about the 'bad prognosticators' such as pt needing to be put back on the vent after coming off and having renal failure that is permanently requiring dialysis etc.  He is informed that cirrhosis of her degree is usually terminal and that she is not going to be a transplant candidate soon and possibly ever (but that is not the final word on that subject).   He wishes to maintain full code status and he would probably want her re-intubated if she fails extubation. But, he understands that if it gets to the point where she is in various hospitals 95% of her life or if she is suffering without any gain being achieved, that they might at that point opt for comfort care  And/ or DNR etc.    For now, I plan on having periodic supportive discussions with husband and family --in addition to those provided by critical care physicians, nurses, and pts attending.  Mr Grace is a very caring person and despite being very hopeful, he is not blind to the reality of what is going on with his wife.   -----------------------------------------------   IMPRESSION: 1.  Autoimmune Hepatitis / Cirrhosis ---h/o variceal bleed (grade 2 esophageal varices EGD in July) ---s/p TIPs procedure ---has hepatic encephalopathy 2. Hematemesis at admission  --with transfusions 3.  Recurrent acute on chronic hypercarbic and hypoxic respiratory failure with bilateral pulmonary infiltrates ---component of impaired ventilatory drive ---intubated after being on and off BIPAP 4.  Paroxysmal AFib  5.  Sinus Bradycardia and Mobitz II 6.  Metabolic encephalopathy 7.  Hypothyroidism 8. Acute Kidney injury on CKD --Cr improved today --had dialysis here ---hapatorenal vs recent dye exposure (outside hospital), vs hypovolemia 9.  Hypotension with use of pressors 10. Hepatic Encephalopathy     .    REVIEW OF SYSTEMS:  Patient is not able to provide ROS due to critical illness  SPIRITUAL SUPPORT SYSTEM: Yes family.  SOCIAL HISTORY:  reports that she quit smoking about 5 years ago. Her smoking use included Cigarettes. She has never used smokeless tobacco. She reports that she does not drink alcohol or use illicit drugs. Jean Tucker is her husband [phone]) and her daughter is Jean Tucker at [phone])  LEGAL DOCUMENTS:  None  CODE STATUS: Full code  PAST MEDICAL HISTORY: Past Medical History  Diagnosis Date  . Allergy   . GERD (gastroesophageal reflux disease)   . CAD (coronary artery disease)   . Hyperlipidemia   . Thyroid disease   . Osteoporosis   . Rheumatoid arthritis ([place])   . Esophageal varices ([place]) 09/22/2014    April, 2016-grade 2-3 varices  . Hypothyroidism   .  Cirrhosis ([place])     PAST SURGICAL HISTORY:  Past Surgical History  Procedure Laterality Date  . Abdominal hysterectomy  1983    partial,bleeding  . Rotator cuff repair      X 4  . Tendon release    . Carpal tunnel release      X 2, right  . Esophagogastroduodenoscopy N/A 12/03/2014    Procedure: ESOPHAGOGASTRODUODENOSCOPY (EGD);  Surgeon: Manya Silvas, MD;  Location:  [facility] ENDOSCOPY;  Service: Endoscopy;  Laterality: N/A;  . Esophagogastroduodenoscopy N/A 04/23/2015    Procedure: UPPER ENDOSCOPY;  Surgeon: Hulen Luster, MD;  Location: [facility] ENDOSCOPY;  Service: Gastroenterology;  Laterality: N/A;    ALLERGIES:  is allergic to codeine; arava; atorvastatin; humira; methotrexate derivatives; and simvastatin.  MEDICATIONS:  Current Facility-Administered Medications  Medication Dose Route Frequency Provider Last Rate Last Dose  . acetaminophen (TYLENOL) tablet 650 mg  650 mg Per Tube Q6H PRN Wilhelmina Mcardle, MD      . alteplase (CATHFLO ACTIVASE) injection 2 mg  2 mg Intracatheter Once PRN Munsoor Lateef, MD      . antiseptic oral rinse (CPC / CETYLPYRIDINIUM CHLORIDE 0.05%) solution 7 mL  7 mL Mouth Rinse BID Vishal Mungal, MD   7 mL at 05/18/15 2200  . antiseptic oral rinse solution (CORINZ)  7 mL Mouth Rinse QID Javier Glazier, MD   7 mL at 05/19/15 0944  . chlorhexidine gluconate (PERIDEX) 0.12 % solution 15 mL  15 mL Mouth Rinse BID Javier Glazier, MD   15 mL at 05/19/15 0802  . dexmedetomidine (PRECEDEX) 400 MCG/100ML (4 mcg/mL) infusion  0.4-1.2 mcg/kg/hr Intravenous Titrated Wilhelmina Mcardle, MD 9.3 mL/hr at 05/19/15 1618 0.4 mcg/kg/hr at 05/19/15 1618  . feeding supplement (PRO-STAT SUGAR FREE 64) liquid 30 mL  30 mL Oral BID Wilhelmina Mcardle, MD   30 mL at 05/18/15 1215  . feeding supplement (VITAL HIGH PROTEIN) liquid 1,000 mL  1,000 mL Per Tube Q24H Wilhelmina Mcardle, MD   1,000 mL at 05/18/15 1205  . fentaNYL (SUBLIMAZE) injection 25-100 mcg  25-100 mcg Intravenous Q2H PRN Brand Males, MD   50 mcg at 05/19/15 1148  . free water 100 mL  100 mL Per Tube 3 times per day Wilhelmina Mcardle, MD   100 mL at 05/19/15 1400  . heparin injection 1,000 Units  1,000 Units Dialysis PRN Munsoor Lateef, MD      . heparin injection 5,000 Units  5,000 Units Subcutaneous Q12H Vishal Mungal, MD   5,000 Units at 05/19/15 0900  . ipratropium-albuterol (DUONEB) 0.5-2.5 (3)  MG/3ML nebulizer solution 3 mL  3 mL Nebulization Q3H PRN Wilhelmina Mcardle, MD      . lactulose (CHRONULAC) 10 GM/15ML solution 20 g  20 g Per Tube Daily Wilhelmina Mcardle, MD   20 g at 05/19/15 0901  . levothyroxine (SYNTHROID, LEVOTHROID) tablet 100 mcg  100 mcg Per Tube [id number] Wilhelmina Mcardle, MD   100 mcg at 05/19/15 0607  . lidocaine (PF) (XYLOCAINE) 1 % injection 5 mL  5 mL Intradermal PRN Munsoor Lateef, MD      . lidocaine-prilocaine (EMLA) cream 1 application  1 application Topical PRN Munsoor Lateef, MD      . metoprolol (LOPRESSOR) injection 2.5 mg  2.5 mg Intravenous Q3H PRN Wilhelmina Mcardle, MD   2.5 mg at 05/19/15 0855  . ondansetron (ZOFRAN) tablet 4 mg  4 mg Oral Q6H PRN Harrie Foreman, MD  Or  . ondansetron (ZOFRAN) injection 4 mg  4 mg Intravenous Q6H PRN Harrie Foreman, MD   4 mg at 05/14/15 1837  . pantoprazole sodium (PROTONIX) 40 mg/20 mL oral suspension 40 mg  40 mg Per Tube Q1200 Wilhelmina Mcardle, MD   40 mg at 05/19/15 1151  . pentafluoroprop-tetrafluoroeth (GEBAUERS) aerosol 1 application  1 application Topical PRN Munsoor Lateef, MD      . phenylephrine (NEO-SYNEPHRINE) 40 mg in dextrose 5 % 250 mL (0.16 mg/mL) infusion  30-200 mcg/min Intravenous Continuous Aldean Jewett, MD 18.8 mL/hr at 05/19/15 1619 50 mcg/min at 05/19/15 1619  . propofol (DIPRIVAN) 1000 MG/100ML infusion  0-50 mcg/kg/min Intravenous Continuous Wilhelmina Mcardle, MD   Stopped at 05/19/15 1100  . rifaximin (XIFAXAN) tablet 550 mg  550 mg Per Tube BID Wilhelmina Mcardle, MD   550 mg at 05/19/15 0901  . sevelamer carbonate (RENVELA) packet 1.6 g  1.6 g Oral TID WC Nicholes Mango, MD   1.6 g at 05/19/15 1211  . sodium chloride 0.9 % injection 3 mL  3 mL Intravenous Q12H Harrie Foreman, MD   3 mL at 05/19/15 0901  . vasopressin (PITRESSIN) 40 Units in sodium chloride 0.9 % 250 mL (0.16 Units/mL) infusion  0.03 Units/min Intravenous Continuous Wilhelmina Mcardle, MD 11.3 mL/hr at 05/19/15 1154 0.03 Units/min  at 05/19/15 1154    Vital Signs: BP 98/61 mmHg  Pulse 54  Temp(Src) 98.1 F (36.7 C) (Axillary)  Resp 20  Ht _0  (1.626 m)  Wt 92.9 kg (204 lb 12.9 oz)  BMI 35.14 kg/m2  SpO2 98% Filed Weights   05/17/15 0429 05/18/15 0435 05/19/15 0500  Weight: 89.4 kg (197 lb 1.5 oz) 91.6 kg (201 lb 15.1 oz) 92.9 kg (204 lb 12.9 oz)    Estimated body mass index is 35.14 kg/(m^2) as calculated from the following:   Height as of this encounter: _1  (1.626 m).   Weight as of this encounter: 92.9 kg (204 lb 12.9 oz).  PERFORMANCE STATUS (ECOG) : 4 - Bedbound  PHYSICAL EXAM: Intubated and sedated Left eyelids partly open Nares patent No JVD  Hrt rrr with occas irreg beats Lungs with ronchi and vent sounds Abd soft and nt Ext no cyanosis or mottling  LABS: CBC:    Component Value Date/Time   WBC 12.3* 05/19/2015 0401   HGB 7.4* 05/19/2015 0401   HCT 23.1* 05/19/2015 0401   PLT 144* 05/19/2015 0401   MCV 91.2 05/19/2015 0401   NEUTROABS 3.1 12/12/2014 1621   LYMPHSABS 2.3 12/12/2014 1621   MONOABS 0.8 12/12/2014 1621   EOSABS 0.2 12/12/2014 1621   BASOSABS 0.1 12/12/2014 1621   Comprehensive Metabolic Panel:    Component Value Date/Time   NA 136 05/19/2015 0401   K 3.6 05/19/2015 0401   CL 101 05/19/2015 0401   CO2 29 05/19/2015 0401   BUN 41* 05/19/2015 0401   CREATININE 2.99* 05/19/2015 0401   CREATININE 0.64 12/12/2014 1621   GLUCOSE 168* 05/19/2015 0401   CALCIUM 8.2* 05/19/2015 0401   AST 33 05/19/2015 0401   ALT 16 05/19/2015 0401   ALKPHOS 48 05/19/2015 0401   BILITOT 1.3* 05/19/2015 0401   PROT 7.8 05/19/2015 0401   ALBUMIN 2.4* 05/19/2015 0401    ECHO 05/14/15:  - Left ventricle: The cavity size was normal. Wall thickness was normal. Systolic function was normal. The estimated ejection fraction was in the range of 55% to 60%. Wall motion was  normal; there were no regional wall motion abnormalities. The study is not technically sufficient to  allow evaluation of LV diastolic function. - Aortic valve: There was mild stenosis. Mean gradient (S): 12 mm Hg. Valve area (VTI): 1.7 cm^2. Valve area (Vmax): 1.72 cm^2. - Mitral valve: There was mild regurgitation. - Left atrium: The atrium was mildly dilated. - Right atrium: The atrium was mildly dilated. - Pulmonary arteries: Systolic pressure was moderately increased. PA peak pressure: 50 mm Hg (S). - Pericardium, extracardiac: A moderate pericardial effusion was identified circumferential to the heart. There was no evidence of hemodynamic compromise. There was a left pleural effusion. More than 50% of the visit was spent in counseling/coordination of care: Yes  Time Spent: 55 minutes

## 2015-05-19 NOTE — Progress Notes (Signed)
RN spoke with Dr. Alva Garnet and made MD aware that patient had increased work of breathing.  Dr. Alva Garnet came to room and assessed patient.  Patient also went out of afib into sinus brady rate in 40's and dropped blood pressure at that time and then back into afib and then blood pressure came up.  Dr. Alva Garnet gave no new orders but instructed RN to continue trying to get propofol off and use precedex and fentanyl pushes.

## 2015-05-19 NOTE — Progress Notes (Signed)
Gastric residual of 50 ml. Residual replaced.

## 2015-05-19 NOTE — Progress Notes (Signed)
Dr. Alva Garnet in room and assessed patient. Patient off sedation since 0750, agitated, restless and trying to pull ET tube out with safety mittens on. Not following commands. Dr. Alva Garnet switched patient to spontaneous mode on ventilator and instructed RN to turn propofol back on to 10-49mcg/kg/min to calm patient and that he would order precedex and to start precedex and try to transition patient off propofol. MD also aware that patient was in sinus rhythm and with agitation went into afib and blood pressure increased.  MD instructed RN to given PRN dose of metoprolol. Daughter at bedside.

## 2015-05-19 NOTE — Progress Notes (Signed)
[facility]  ROUNDING NOTE   Subjective:  Patient seen at bedside. Creatinine currently down to 2.99. Remains on the ventilator at present. Currently agitated and tried to pull out endotracheal tube. Good urine output noted.    Objective:  Vital signs in last 24 hours:  Temp:  [98 F (36.7 C)-98.9 F (37.2 C)] 98 F (36.7 C) (12/27 0800) Pulse Rate:  [56-63] 60 (12/27 0800) Resp:  [12-17] 17 (12/27 0800) BP: (90-130)/(41-83) 115/56 mmHg (12/27 0800) SpO2:  [93 %-99 %] 95 % (12/27 0800) FiO2 (%):  [30 %-35 %] 30 % (12/27 0745) Weight:  [92.9 kg (204 lb 12.9 oz)] 92.9 kg (204 lb 12.9 oz) (12/27 0500)  Weight change: 1.3 kg (2 lb 13.9 oz) Filed Weights   05/17/15 0429 05/18/15 0435 05/19/15 0500  Weight: 89.4 kg (197 lb 1.5 oz) 91.6 kg (201 lb 15.1 oz) 92.9 kg (204 lb 12.9 oz)    Intake/Output: I/O last 3 completed shifts: In: 5280.1 [I.V.:3065.1; Other:135; NG/GT:1980; IV Piggyback:100] Out: 2389 [Urine:1213; [id number]   Intake/Output this shift:  Total I/O In: 54.4 [I.V.:54.4] Out: -   Physical Exam: General:  critically ill appearing  HENT  ET tube in place   Eyes: Anicteric  Neck: Supple  Lungs:  Vent assisted, bilateral rhonchi  Heart: irregular rate and rhythm, A Fib noted  Abdomen:  Soft, nontender, mildly distended  Extremities: 1+ dependent and peripheral edema.  Neurologic: Sedation on hold at the moment, agitated  Skin: No lesions  Access: Right femoral vascath 12/16 Dr. Stevenson Clinch.   Foley  Basic Metabolic Panel:  Recent Labs Lab 05/14/15 0520 05/15/15 0528 05/16/15 0527 05/17/15 0438 05/18/15 0449 05/18/15 1206 05/19/15 0401  NA 141 138 138 141 141 136 136  K 3.7 3.8 4.9 3.4* 3.2* 3.7 3.6  CL 101 99* 97* 104 104 103 101  CO2 32 34* 35* 30 31 29 29   GLUCOSE 132* 196* 186* 132* 125* 139* 168*  BUN 29* 28* 28* 34* 34* 38* 41*  CREATININE 3.53* 3.55* 3.65* 3.75* 3.20* 3.28* 2.99*  CALCIUM 8.6* 8.7* 8.9 8.2* 8.5* 8.2* 8.2*  MG  1.7 1.6* 2.7* 2.2 2.2  --   --   PHOS 4.6 5.5* 8.2* 3.9 3.6  --   --     Liver Function Tests:  Recent Labs Lab 05/13/15 0418 05/15/15 0528 05/16/15 0527 05/18/15 2017 05/19/15 0401  AST 24 33 36 37 33  ALT 13* 16 19 17 16   ALKPHOS 55 49 54 49 48  BILITOT 1.2 1.1 1.4* 1.3* 1.3*  PROT 7.9 8.1 8.8* 7.8 7.8  ALBUMIN 2.5* 2.6* 2.7* 2.6* 2.4*   No results for input(s): LIPASE, AMYLASE in the last 168 hours.  Recent Labs Lab 05/15/15 0528 05/15/15 1359 05/18/15 2017  AMMONIA 47* 38* 32    CBC:  Recent Labs Lab 05/15/15 0528 05/16/15 0527 05/17/15 0438 05/18/15 0449 05/19/15 0401  WBC 10.9 12.9* 13.6* 11.8* 12.3*  HGB 8.6* 8.4* 7.2* 7.7* 7.4*  HCT 26.8* 28.0* 22.0* 23.9* 23.1*  MCV 94.0 96.9 91.4 92.6 91.2  PLT 160 189 140* 155 144*    Cardiac Enzymes: No results for input(s): CKTOTAL, CKMB, CKMBINDEX, TROPONINI in the last 168 hours.  BNP: Invalid input(s): POCBNP  CBG:  Recent Labs Lab 05/18/15 1625 05/18/15 2002 05/19/15 0001 05/19/15 0447 05/19/15 0725  GLUCAP 180* 180* 152* 141* 165*    Microbiology: Results for orders placed or performed during the hospital encounter of 05/18/2015  Culture, blood (Routine X 2)  w Reflex to ID Panel     Status: None   Collection Time: 04/25/2015  2:18 AM  Result Value Ref Range Status   Specimen Description BLOOD LEFT ASSIST CONTROL  Final   Special Requests BOTTLES DRAWN AEROBIC AND ANAEROBIC 5 CC  Final   Culture NO GROWTH 5 DAYS  Final   Report Status 05/13/2015 FINAL  Final  Culture, blood (Routine X 2) w Reflex to ID Panel     Status: None   Collection Time: 05/02/2015  2:18 AM  Result Value Ref Range Status   Specimen Description BLOOD RIGHT ASSIST CONTROL  Final   Special Requests BOTTLES DRAWN AEROBIC AND ANAEROBIC 1 CC  Final   Culture NO GROWTH 5 DAYS  Final   Report Status 05/13/2015 FINAL  Final  Urine culture     Status: None   Collection Time: 05/09/2015  6:05 AM  Result Value Ref Range Status    Specimen Description URINE, RANDOM  Final   Special Requests NONE  Final   Culture 50,000 COLONIES/mL CANDIDA ALBICANS  Final   Report Status 05/11/2015 FINAL  Final  C difficile quick scan w PCR reflex     Status: None   Collection Time: 05/17/15  9:13 AM  Result Value Ref Range Status   C Diff antigen NEGATIVE NEGATIVE Final   C Diff toxin NEGATIVE NEGATIVE Final   C Diff interpretation Negative for C. difficile  Final    Coagulation Studies: No results for input(s): LABPROT, INR in the last 72 hours.  Urinalysis: No results for input(s): COLORURINE, LABSPEC, PHURINE, GLUCOSEU, HGBUR, BILIRUBINUR, KETONESUR, PROTEINUR, UROBILINOGEN, NITRITE, LEUKOCYTESUR in the last 72 hours.  Invalid input(s): APPERANCEUR    Imaging: Dg Chest Port 1 View  05/19/2015  CLINICAL DATA:  Respiratory failure. EXAM: PORTABLE CHEST 1 VIEW COMPARISON:  05/18/2015. FINDINGS: Endotracheal tube, NG tube in stable position. Persistent cardiomegaly. Slight improvement of bilateral pulmonary infiltrates and or edema. Small bilateral pleural effusions again noted with slight improvement. No pneumothorax. IMPRESSION: 1.  Lines and tubes in stable position. 2. Stable severe cardiomegaly. Persistent but slightly improved bilateral pulmonary infiltrates and or edema. Interim improvement of small bilateral pleural effusions. Electronically Signed   By: Marcello Moores  Register   On: 05/19/2015 07:33   Dg Chest Port 1 View  05/18/2015  CLINICAL DATA:  Respiratory failure EXAM: PORTABLE CHEST 1 VIEW COMPARISON:  05/17/2015 FINDINGS: Cardiomegaly again noted. Stable NG tube position. Endotracheal tube in place with tip 2.8 cm above the carina. Again noted streaky bilateral interstitial prominence and airspace opacities consistent with edema or bilateral pneumonia. There is no pneumothorax. IMPRESSION: . Stable NG tube position. Endotracheal tube in place. No pneumothorax. Again noted bilateral infiltrates or pulmonary edema.  Electronically Signed   By: Lahoma Crocker M.D.   On: 05/18/2015 09:54     Medications:   . dexmedetomidine    . phenylephrine (NEO-SYNEPHRINE) Adult infusion 115 mcg/min (05/19/15 0801)  . propofol (DIPRIVAN) infusion 10 mcg/kg/min (05/19/15 0825)  . vasopressin (PITRESSIN) infusion - *FOR SHOCK* 0.03 Units/min (05/18/15 1207)   . albumin human  12.5 g Intravenous Q1200  . antiseptic oral rinse  7 mL Mouth Rinse BID  . antiseptic oral rinse  7 mL Mouth Rinse QID  . chlorhexidine gluconate  15 mL Mouth Rinse BID  . feeding supplement (PRO-STAT SUGAR FREE 64)  30 mL Oral BID  . feeding supplement (VITAL HIGH PROTEIN)  1,000 mL Per Tube Q24H  . free water  100 mL Per  Tube 3 times per day  . heparin subcutaneous  5,000 Units Subcutaneous Q12H  . lactulose  20 g Per Tube Daily  . levothyroxine  100 mcg Per Tube Q0600  . metoprolol tartrate  12.5 mg Per Tube BID  . pantoprazole sodium  40 mg Per Tube Q1200  . rifaximin  550 mg Per Tube BID  . sevelamer carbonate  1.6 g Oral TID WC  . sodium chloride  3 mL Intravenous Q12H   acetaminophen, alteplase, fentaNYL (SUBLIMAZE) injection, heparin, ipratropium-albuterol, lidocaine (PF), lidocaine-prilocaine, metoprolol, ondansetron **OR** ondansetron (ZOFRAN) IV, pentafluoroprop-tetrafluoroeth  Assessment/ Plan:  Jean Tucker is a 64 y.o. 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  1. Acute renal failure: likely ATN after TIPS procedure. Hemodialysis 12/16 due to worsening renal function.   Baseline creatinine of 0.85 from 12/2 - Creatinine currently down to 2.99 with good urine output. No acute indication for dialysis at present. Continue supportive care and avoid nephrotoxins and hypotension as possible.  2. 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 04/25/15.  - rifaximin and lactulose for hepatic encephalopathy  3. Acute resp failure from Pneumonia and Acute pulmonary edema.  Intubated 05/16/15. Not yet ready for extubation.  Patient attempted to take out endotracheal tube this a.m. Will be placed back on sedation.  4. Generalized edema Has been given when necessary Lasix in the past. Okay to periodically use as necessary.    LOS: 11 Tarius Stangelo 12/27/20168:35 AM

## 2015-05-19 NOTE — Progress Notes (Signed)
5cc gastric residual.

## 2015-05-19 NOTE — Progress Notes (Signed)
Nutrition Follow-up    INTERVENTION:   EN: recommend continuing TF regimen as tolerated; if remains on vent, may need to further adjust TF goal rate as pt now being weaned off diprivan   NUTRITION DIAGNOSIS:   Inadequate oral intake related to acute illness as evidenced by per patient/family report. Being addressed via TF  GOAL:   Patient will meet greater than or equal to 90% of their needs  MONITOR:    (Energy intake, Electrolyte and renal profile)  REASON FOR ASSESSMENT:   Consult Poor PO  ASSESSMENT:   Pt remains on vent, weaning trial this AM, on precedex, weaning off diprivan  Diet Order:   NPO  EN: Vital HP at 40 ml/hr  Digestive System: no signs of TF intolerance, abdomen soft/obese, BS active, +loose stool  Electrolyte and Renal Profile:  Recent Labs Lab 05/16/15 0527 05/17/15 0438 05/18/15 0449 05/18/15 1206 05/19/15 0401  BUN 28* 34* 34* 38* 41*  CREATININE 3.65* 3.75* 3.20* 3.28* 2.99*  NA 138 141 141 136 136  K 4.9 3.4* 3.2* 3.7 3.6  MG 2.7* 2.2 2.2  --   --   PHOS 8.2* 3.9 3.6  --   --    Glucose Profile:  Recent Labs  05/19/15 0447 05/19/15 0725 05/19/15 1128  GLUCAP 141* 165* 146*   Meds: lactulose, xifaxan, renvela  Height:   Ht Readings from Last 1 Encounters:  05/11/15 5\' 4"  (1.626 m)    Weight:   Wt Readings from Last 1 Encounters:  05/19/15 204 lb 12.9 oz (92.9 kg)   Filed Weights   05/17/15 0429 05/18/15 0435 05/19/15 0500  Weight: 197 lb 1.5 oz (89.4 kg) 201 lb 15.1 oz (91.6 kg) 204 lb 12.9 oz (92.9 kg)   BMI:  Body mass index is 35.14 kg/(m^2).  Estimated Nutritional Needs:   Kcal:  (11-14 kcals/kg) Using 92kg.  [id number] kcals/d.  Protein:  (2.0-2.5 g/kg) Using IBW of 55kg 110-138 g/d   Fluid:  1061ml +UOP  EDUCATION NEEDS:   No education needs identified at this time   [place], RD, LDN [phone] Pager  [phone] Weekend/On-Call Pager

## 2015-05-19 NOTE — Progress Notes (Signed)
Pharmacy Consult for Electrolyte Monitoring and Replacement  Allergies  Allergen Reactions  . Codeine Anaphylaxis  . Arava [Leflunomide] Other (See Comments)    Pt states that this medication causes liver damage.   . Atorvastatin Other (See Comments)    Pt states that this medication causes liver damage.   . Humira [Adalimumab] Other (See Comments)    Pt states that this medication causes liver damage.   . Methotrexate Derivatives Other (See Comments)    Pt states that this medication causes liver damage.   . Simvastatin Other (See Comments)    Pt states that this medication causes liver damage and joint pain.     Patient Measurements: Height: 5\' 4"  (162.6 cm) Weight: 204 lb 12.9 oz (92.9 kg) IBW/kg (Calculated) : 54.7  Vital Signs: Temp: 98 F (36.7 C) (12/27 0800) Temp Source: Oral (12/27 0800) BP: 114/80 mmHg (12/27 1115) Pulse Rate: 108 (12/27 1115) Intake/Output from previous day: 12/26 0701 - 12/27 0700 In: 3338.2 [I.V.:1868.2; NG/GT:1370; IV Piggyback:100] Out: 1505 [Urine:605; Stool:900] Intake/Output from this shift: Total I/O In: 326.9 [I.V.:206.9; NG/GT:120] Out: -   Labs:  Recent Labs  05/17/15 0438 05/18/15 0449 05/19/15 0401  WBC 13.6* 11.8* 12.3*  HGB 7.2* 7.7* 7.4*  HCT 22.0* 23.9* 23.1*  PLT 140* 155 144*     Recent Labs  05/17/15 0438 05/18/15 0449 05/18/15 1206 05/18/15 2017 05/19/15 0401  NA 141 141 136  --  136  K 3.4* 3.2* 3.7  --  3.6  CL 104 104 103  --  101  CO2 30 31 29   --  29  GLUCOSE 132* 125* 139*  --  168*  BUN 34* 34* 38*  --  41*  CREATININE 3.75* 3.20* 3.28*  --  2.99*  CALCIUM 8.2* 8.5* 8.2*  --  8.2*  MG 2.2 2.2  --   --   --   PHOS 3.9 3.6  --   --   --   PROT  --   --   --  7.8 7.8  ALBUMIN  --   --   --  2.6* 2.4*  AST  --   --   --  37 33  ALT  --   --   --  17 16  ALKPHOS  --   --   --  49 48  BILITOT  --   --   --  1.3* 1.3*  BILIDIR  --   --   --  0.4  --   IBILI  --   --   --  0.9  --   TRIG  --    --   --   --  56   Estimated Creatinine Clearance: 21.6 mL/min (by C-G formula based on Cr of 2.99).    Recent Labs  05/19/15 0001 05/19/15 0447 05/19/15 0725  GLUCAP 152* 141* 165*    Medical History: Past Medical History  Diagnosis Date  . Allergy   . GERD (gastroesophageal reflux disease)   . CAD (coronary artery disease)   . Hyperlipidemia   . Thyroid disease   . Osteoporosis   . Rheumatoid arthritis ([place])   . Esophageal varices ([place]) 09/22/2014    April, 2016-grade 2-3 varices  . Hypothyroidism   . Cirrhosis ([place])     Medications:  Scheduled:  . albumin human  12.5 g Intravenous Q1200  . antiseptic oral rinse  7 mL Mouth Rinse BID  . antiseptic oral rinse  7 mL Mouth Rinse QID  .  chlorhexidine gluconate  15 mL Mouth Rinse BID  . feeding supplement (PRO-STAT SUGAR FREE 64)  30 mL Oral BID  . feeding supplement (VITAL HIGH PROTEIN)  1,000 mL Per Tube Q24H  . free water  100 mL Per Tube 3 times per day  . heparin subcutaneous  5,000 Units Subcutaneous Q12H  . lactulose  20 g Per Tube Daily  . levothyroxine  100 mcg Per Tube Q0600  . pantoprazole sodium  40 mg Per Tube Q1200  . rifaximin  550 mg Per Tube BID  . sevelamer carbonate  1.6 g Oral TID WC  . sodium chloride  3 mL Intravenous Q12H   Infusions:  . dexmedetomidine 0.5 mcg/kg/hr (05/19/15 0854)  . phenylephrine (NEO-SYNEPHRINE) Adult infusion 80 mcg/min (05/19/15 1100)  . propofol (DIPRIVAN) infusion 4.929 mcg/kg/min (05/19/15 1000)  . vasopressin (PITRESSIN) infusion - *FOR SHOCK* 0.03 Units/min (05/18/15 1207)    Assessment: Pharmacy consulted to assist in managing electrolytes in this 64 y/o F with ARF currently requiring HD.    Plan:  Electrolytes are wnl. Will f/u am labs.    Ulice Dash D, Pharm.D., BCPS Clinical Pharmacist 05/19/2015,11:26 AM

## 2015-05-19 NOTE — Progress Notes (Addendum)
[facility] [place] NAME: Jean Tucker    MR#:  [id number]  DATE OF BIRTH:  [birth year]    CHIEF COMPLAINT:   Chief Complaint  Patient presents with  . Shortness of Breath   Intubated, SBT trials today, propofol was discontinued and patient is started on Precedex. Husband at bedside  REVIEW OF SYSTEMS:   ROS unable to obtain  DRUG ALLERGIES:   Allergies  Allergen Reactions  . Codeine Anaphylaxis  . Arava [Leflunomide] Other (See Comments)    Pt states that this medication causes liver damage.   . Atorvastatin Other (See Comments)    Pt states that this medication causes liver damage.   . Humira [Adalimumab] Other (See Comments)    Pt states that this medication causes liver damage.   . Methotrexate Derivatives Other (See Comments)    Pt states that this medication causes liver damage.   . Simvastatin Other (See Comments)    Pt states that this medication causes liver damage and joint pain.     VITALS:  Blood pressure 110/74, pulse 112, temperature 97.7 F (36.5 C), temperature source Oral, resp. rate 19, height 5\' 4"  (1.626 m), weight 92.9 kg (204 lb 12.9 oz), SpO2 96 %.  PHYSICAL EXAMINATION:  GENERAL:  64 y.o.-year-old patient  Critically ill LUNGS: intubated, coarse ventilated breath sounds, good air movement bilaterally. ET tube is intact CARDIOVASCULAR: S1, S2 normal. No murmurs, rubs, or gallops. Tachycardic, irregular intermittently ABDOMEN: Soft, nontender, nondistended. Bowel sounds present. No organomegaly or mass.  EXTREMITIES: No cyanosis, or clubbing. +1 bilateral pedal edema NEUROLOGIC: Moving all 4 extremities spontaneously, no focal defects PSYCHIATRIC: Is resting comfortably during my examination, does not follow commands, sedated SKIN: No obvious rash, lesion, or ulcer.   LABORATORY PANEL:   CBC  Recent Labs Lab 05/19/15 0401  WBC 12.3*  HGB 7.4*  HCT 23.1*  PLT 144*    ------------------------------------------------------------------------------------------------------------------  Chemistries   Recent Labs Lab 05/18/15 0449  05/19/15 0401  NA 141  < > 136  K 3.2*  < > 3.6  CL 104  < > 101  CO2 31  < > 29  GLUCOSE 125*  < > 168*  BUN 34*  < > 41*  CREATININE 3.20*  < > 2.99*  CALCIUM 8.5*  < > 8.2*  MG 2.2  --   --   AST  --   < > 33  ALT  --   < > 16  ALKPHOS  --   < > 48  BILITOT  --   < > 1.3*  < > = values in this interval not displayed. ------------------------------------------------------------------------------------------------------------------  Cardiac Enzymes No results for input(s): TROPONINI in the last 168 hours. ------------------------------------------------------------------------------------------------------------------  RADIOLOGY:  Dg Chest Port 1 View  05/19/2015  CLINICAL DATA:  Respiratory failure. EXAM: PORTABLE CHEST 1 VIEW COMPARISON:  05/18/2015. FINDINGS: Endotracheal tube, NG tube in stable position. Persistent cardiomegaly. Slight improvement of bilateral pulmonary infiltrates and or edema. Small bilateral pleural effusions again noted with slight improvement. No pneumothorax. IMPRESSION: 1.  Lines and tubes in stable position. 2. Stable severe cardiomegaly. Persistent but slightly improved bilateral pulmonary infiltrates and or edema. Interim improvement of small bilateral pleural effusions. Electronically Signed   By: Marcello Moores  Register   On: 05/19/2015 07:33   Dg Chest Port 1 View  05/18/2015  CLINICAL DATA:  Respiratory failure EXAM: PORTABLE CHEST 1 VIEW COMPARISON:  05/17/2015 FINDINGS: Cardiomegaly again noted. Stable NG tube position.  Endotracheal tube in place with tip 2.8 cm above the carina. Again noted streaky bilateral interstitial prominence and airspace opacities consistent with edema or bilateral pneumonia. There is no pneumothorax. IMPRESSION: . Stable NG tube position. Endotracheal tube in  place. No pneumothorax. Again noted bilateral infiltrates or pulmonary edema. Electronically Signed   By: Lahoma Crocker M.D.   On: 05/18/2015 09:54    EKG:   Orders placed or performed during the hospital encounter of 05/12/2015  . EKG 12-Lead  . EKG 12-Lead  . ED EKG  . ED EKG  . EKG 12-Lead  . EKG 12-Lead  . EKG 12-Lead  . 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 She was initially admitted at [facility] on 12/1 for hematemesis due to ruptured esophageal varices. She was transferred to [facility] on 12/2 and had variceal banding and TIPS procedure on 12/3. Her hospitalization at [facility] was compensated by severe hepatic encephalopathy and also renal failure thought to be due to ATN after contrast exposure, hypovolemia in the setting of blood loss and possibly also hepatorenal syndrome. She left [facility] AMA on 12/15 and presented to [facility] on 12/16 with respiratory distress.  1. 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 Hypothyroidism continue Synthroid.  5 history of rheumatoid arthritis: Intolerant to methotrexate, Humira, ARava  6. Atrial fibrillation: Has had tachycardia and bradycardia arrhythmias overnight - Cardiology consultation is still pending for 12/26 - Continue  metoprolol  7. Hypotension- better - Continue pressor support and IV fluids  Family has requested transfer to [place]. Dr.walsh has  reviewed her [facility] and [facility] charts with Dr. Ruthann Cancer intensivist at [facility] on 05/18/15. There are no ICU beds at [facility], he has not accepted the patient, he does not feel that he would change current management. Husband updated. Today I had another discussion with the husband, aware of the situation, states that he is not giving up until she gets better. He is in positive spirits  CODE STATUS: Full code. Discussed again with the patient's husband today. For now he wants full code and aggressive treatment. He did say that she would not want to be on prolonged life support.   All the records are reviewed and case discussed with Care Management/Social Workerr. Management plans discussed with the patient, family and they are in agreement.  CODE STATUS: full  TOTAL critical care TIME TAKING CARE OF THIS PATIENT 35 minutes.   POSSIBLE D/C IN ? DAYS, DEPENDING ON CLINICAL CONDITION.   Nicholes Mango M.D on 05/19/2015 at 12:42 PM  Between 7am to 6pm - Pager - [phone]  After 6pm go to [email or web address] - password [email or web address] [facility] [place] [facility]  Office  [phone]  CC: Primary care physician; Loura Pardon, MD   Note: This dictation was prepared with Dragon dictation along with smaller phrase technology. Any transcriptional  errors that result from this process are unintentional.

## 2015-05-19 NOTE — Progress Notes (Signed)
Sedated with precedex.  Opens eyes to voice at times. Purposeful movements attempting to pull out ET tube at times.  Safety mitts in place Sinus brady this evening per cardiac monitor.  Tolerating spontaneous mode on ventilator.  Neo drip titrated down this shift and blood pressure tolerating.  Good urine output.  Husband at bedside.

## 2015-05-19 NOTE — Progress Notes (Signed)
PT Cancellation Note  Patient Details Name: RUBYROSE FECTEAU MRN: [id number] DOB: 03/04/1951   Cancelled Treatment:    Reason Eval/Treat Not Completed: Medical issues which prohibited therapy (Patient remains intubated/sedated without new PT orders noted.  Will complete initial order; please re-consult as medically appropriate.)  Orly Quimby H. Owens Shark, PT, DPT, NCS 05/19/2015, 8:13 AM [phone]

## 2015-05-19 NOTE — Progress Notes (Addendum)
PULM/CCM NOTE   MAJOR EVENTS/TEST RESULTS: 12/16 Admitted to ICU /SDU with dyspnea, dx of PNA, AKI. Intermittent BiPAP 12/18 transferred out of ICU 12/19 Pt transferred back to ICU/SDU on BiPAP. Resp status improved after HD. Severe delirium after Ambien 12/19 PM 12/22 Back to baseline mentation. PCCM signed off.  12/23 transferred out of ICU /SDU 12/24 returned to ICU with severe hypercarbic respiratory failure requiring intubation 12/25 AFRVR. NE changed to phenylephrine. Low dose metoprolol initiated 12/26 2nd degree HB. PAF persists. Sinus bradycardia. Cards consult requested. Little progress. Palliative Care consultation requested 12/27 Tolerating PS @ 10 cm H2O all day. Very agitated on WUA. Propofol DC'd and dexmedetomidine initiated with excellent result  INDWELLING DEVICES:: R fem HD cath 12/17 >>  ETT 12/24 >>   MICRO DATA: Blood 12/16 >> NEG Urine 12/16 >> 50k colonies candida  ANTIMICROBIALS:    SUBJ: RASS -2 to +1, not F/C. Remains on vasopressin and phenylephrine  Filed Vitals:   05/19/15 1230 05/19/15 1300 05/19/15 1330 05/19/15 1400  BP: 110/74 118/83 100/66 109/67  Pulse: 112 109 55 55  Temp:      TempSrc:      Resp: 19 13 15 15   Height:      Weight:      SpO2: 96% 97% 97% 97%   Intubated, sedated HEENT WNL JVP not visualized No wheezes Brady, reg, no M NABS, soft, NT No LE edema  BMP Latest Ref Rng 05/19/2015 05/18/2015 05/18/2015  Glucose 65 - 99 mg/dL 168(H) 139(H) 125(H)  BUN 6 - 20 mg/dL 41(H) 38(H) 34(H)  Creatinine 0.44 - 1.00 mg/dL 2.99(H) 3.28(H) 3.20(H)  Sodium 135 - 145 mmol/L 136 136 141  Potassium 3.5 - 5.1 mmol/L 3.6 3.7 3.2(L)  Chloride 101 - 111 mmol/L 101 103 104  CO2 22 - 32 mmol/L 29 29 31   Calcium 8.9 - 10.3 mg/dL 8.2(L) 8.2(L) 8.5(L)    CBC Latest Ref Rng 05/19/2015 05/18/2015 05/17/2015  WBC 3.6 - 11.0 K/uL 12.3(H) 11.8(H) 13.6(H)  Hemoglobin 12.0 - 16.0 g/dL 7.4(L) 7.7(L) 7.2(L)  Hematocrit 35.0 - 47.0 % 23.1(L)  23.9(L) 22.0(L)  Platelets 150 - 440 K/uL 144(L) 155 140(L)    CXR: slightly improved edema pattern  IMPRESSION: 1) Recurrent acute on chronic hypercarbic and  hypoxic respiratory failure with bilateral pulmonary infiltrates   Suspect component of impaired ventilatory drive 2) PAF <> NSR <> sinus brady <> Mobitz II 3) Hypotension requiring vasopressors - improving.  4) AKI on CKD, nonoliguric. 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 No overt blood loss 7) Toxic-metabolic encephalopathy, ICU acquired delirium 8) hypothyroidism, TSH 3.5 12/25 9) Prognosis remains guarded but seems to have made some improvement today  PLAN/REC: Cont vent support - settings reviewed and/or adjusted  Wean in PSV as tolerated Cont vent bundle Daily SBT if/when meets criteria Cont low dose PRN metoprolol  Cont phenylephrine to maintain MAP > 65 mmHg Cont vasopressin - initiated 12/26 Monitor BMET intermittently Monitor I/Os Correct electrolytes as indicated Renal following. No HD planned 12/27 Monitor temp, WBC count Micro and abx as above DVT px: SCDs Monitor CBC intermittently Transfuse per usual guidelines   Husband and daughter updated @ bedside.   CCM Time - 30 mins  Merton Border, MD PCCM service Mobile [phone] Pager [phone]

## 2015-05-20 ENCOUNTER — Inpatient Hospital Stay: Payer: BLUE CROSS/BLUE SHIELD

## 2015-05-20 DIAGNOSIS — J9600 Acute respiratory failure, unspecified whether with hypoxia or hypercapnia: Secondary | ICD-10-CM

## 2015-05-20 DIAGNOSIS — G9340 Encephalopathy, unspecified: Secondary | ICD-10-CM

## 2015-05-20 LAB — BASIC METABOLIC PANEL
Anion gap: 5 (ref 5–15)
BUN: 43 mg/dL — AB (ref 6–20)
CHLORIDE: 102 mmol/L (ref 101–111)
CO2: 28 mmol/L (ref 22–32)
CREATININE: 2.47 mg/dL — AB (ref 0.44–1.00)
Calcium: 8.5 mg/dL — ABNORMAL LOW (ref 8.9–10.3)
GFR calc Af Amer: 23 mL/min — ABNORMAL LOW (ref >60)
GFR calc non Af Amer: 20 mL/min — ABNORMAL LOW (ref >60)
GLUCOSE: 170 mg/dL — AB (ref 65–99)
POTASSIUM: 3.6 mmol/L (ref 3.5–5.1)
Sodium: 135 mmol/L (ref 135–145)

## 2015-05-20 LAB — BLOOD GAS, ARTERIAL
ACID-BASE EXCESS: 4.2 mmol/L — AB (ref 0.0–3.0)
ACID-BASE EXCESS: 5.1 mmol/L — AB (ref 0.0–3.0)
ALLENS TEST (PASS/FAIL): POSITIVE — AB
Allens test (pass/fail): POSITIVE — AB
BICARBONATE: 29.7 meq/L — AB (ref 21.0–28.0)
Bicarbonate: 31.5 mEq/L — ABNORMAL HIGH (ref 21.0–28.0)
FIO2: 0.32
FIO2: 0.32
O2 SAT: 93.9 %
O2 Saturation: 92.5 %
PCO2 ART: 49 mmHg — AB (ref 32.0–48.0)
PH ART: 7.39 (ref 7.350–7.450)
PH ART: 7.35 (ref 7.350–7.450)
PO2 ART: 66 mmHg — AB (ref 83.0–108.0)
Patient temperature: 37
Patient temperature: 37
pCO2 arterial: 57 mmHg — ABNORMAL HIGH (ref 32.0–48.0)
pO2, Arterial: 74 mmHg — ABNORMAL LOW (ref 83.0–108.0)

## 2015-05-20 LAB — CBC
HEMATOCRIT: 23.2 % — AB (ref 35.0–47.0)
Hemoglobin: 7.6 g/dL — ABNORMAL LOW (ref 12.0–16.0)
MCH: 30.3 pg (ref 26.0–34.0)
MCHC: 32.6 g/dL (ref 32.0–36.0)
MCV: 93 fL (ref 80.0–100.0)
Platelets: 129 10*3/uL — ABNORMAL LOW (ref 150–440)
RBC: 2.5 MIL/uL — ABNORMAL LOW (ref 3.80–5.20)
RDW: 20.2 % — AB (ref 11.5–14.5)
WBC: 9.9 10*3/uL (ref 3.6–11.0)

## 2015-05-20 LAB — GLUCOSE, CAPILLARY
Glucose-Capillary: 143 mg/dL — ABNORMAL HIGH (ref 65–99)
Glucose-Capillary: 152 mg/dL — ABNORMAL HIGH (ref 65–99)

## 2015-05-20 MED ORDER — FENTANYL CITRATE (PF) 100 MCG/2ML IJ SOLN
50.0000 ug | INTRAMUSCULAR | Status: DC | PRN
  Administered 2015-05-20: 50 ug via INTRAVENOUS
  Administered 2015-05-20: 75 ug via INTRAVENOUS
  Administered 2015-05-20: 50 ug via INTRAVENOUS
  Administered 2015-05-20 – 2015-05-21 (×6): 100 ug via INTRAVENOUS
  Filled 2015-05-20 (×10): qty 2

## 2015-05-20 MED ORDER — NOREPINEPHRINE BITARTRATE 1 MG/ML IV SOLN
0.0000 ug/min | INTRAVENOUS | Status: DC
  Filled 2015-05-20: qty 16

## 2015-05-20 MED ORDER — GLYCOPYRROLATE 1 MG PO TABS: 1.0000 mg | ORAL_TABLET | Freq: Two times a day (BID) | ORAL | Status: DC

## 2015-05-20 MED ORDER — GLYCOPYRROLATE 0.2 MG/ML IJ SOLN
0.1000 mg | INTRAMUSCULAR | Status: DC | PRN
  Administered 2015-05-20 – 2015-05-21 (×4): 0.1 mg via INTRAVENOUS
  Filled 2015-05-20 (×4): qty 1

## 2015-05-20 MED ORDER — SODIUM CHLORIDE 0.9 % IV SOLN
0.0300 [IU]/min | INTRAVENOUS | Status: DC
  Filled 2015-05-20: qty 2

## 2015-05-20 MED ORDER — LORAZEPAM 2 MG/ML IJ SOLN
2.0000 mg | INTRAMUSCULAR | Status: DC | PRN
  Administered 2015-05-21 (×3): 2 mg via INTRAVENOUS
  Filled 2015-05-20 (×3): qty 1

## 2015-05-20 MED ORDER — LORAZEPAM 2 MG/ML IJ SOLN
2.0000 mg | INTRAMUSCULAR | Status: DC | PRN
  Administered 2015-05-20: 1 mg via INTRAVENOUS
  Filled 2015-05-20: qty 1

## 2015-05-20 NOTE — Progress Notes (Signed)
Patient comfort care per Dr. Zoila Shutter order. Patient given fentanyl 37mcg IV for shortness of breath.  Rectal tube removed.  Family at bedside.

## 2015-05-20 NOTE — Consult Note (Signed)
After further discussion with husband, Patient conveyed to family to let her go and does NOT want to be placed on vent. Patient still struggling to breathe, using accessory muscles to breathe with rhonchi.  I will place DNR and comfort care orders as per Husband discussion and decision. Family satisfied with plan of action.

## 2015-05-20 NOTE — Progress Notes (Signed)
   05/20/15 1500  Clinical Encounter Type  Visited With Patient and family together  Visit Type Follow-up;Spiritual support  Referral From Nurse  Consult/Referral To Chaplain  Spiritual Encounters  Spiritual Needs Grief support  Returned to check on continuing needs of family. Husband said he would not stay in room if I (chaplain) were present "I've done enough praying.Rene Paci given up believing in God. I won't be in here with her (indicating me)." I prayed with other family members and them to call me if they wanted me to return. [place]

## 2015-05-20 NOTE — Progress Notes (Signed)
Gastric residual 220 mL. Residual replaced.

## 2015-05-20 NOTE — Progress Notes (Signed)
PULM/CCM NOTE   MAJOR EVENTS/TEST RESULTS: 12/16 Admitted to ICU /SDU with dyspnea, dx of PNA, AKI. Intermittent BiPAP 12/18 transferred out of ICU 12/19 Pt transferred back to ICU/SDU on BiPAP. Resp status improved after HD. Severe delirium after Ambien 12/19 PM 12/22 Back to baseline mentation. PCCM signed off.  12/23 transferred out of ICU /SDU 12/24 returned to ICU with severe hypercarbic respiratory failure requiring intubation 12/25 AFRVR. NE changed to phenylephrine. Low dose metoprolol initiated 12/26 2nd degree HB. PAF persists. Sinus bradycardia. Cards consult requested. Little progress. Palliative Care consultation requested 12/27 Tolerating PS @ 10 cm H2O all day. Very agitated on WUA. Propofol DC'd and dexmedetomidine initiated with excellent result 12/27 self extubated-placed back on biPAP  INDWELLING DEVICES:: R fem HD cath 12/17 >>  ETT 12/24 >>   MICRO DATA: Blood 12/16 >> NEG Urine 12/16 >> 50k colonies candida  ANTIMICROBIALS:    SUBJ: Patient with severe resp distress, family at bedside They have decided to place back on vent despite multiorgan failure and despite patients wishes  Filed Vitals:   05/20/15 0700 05/20/15 0800 05/20/15 0900 05/20/15 1000  BP: 110/67 124/67 136/84 122/82  Pulse: 89 100 90 90  Temp:      TempSrc:      Resp: 19 33 24 28  Height:      Weight:      SpO2: 96% 94% 96% 96%   +resp distress HEENT WNL JVP not visualized Atilano Median, reg, no M NABS, soft, NT No LE edema  Review of Systems  Unable to perform ROS: critical illness     BMP Latest Ref Rng 05/20/2015 05/19/2015 05/18/2015  Glucose 65 - 99 mg/dL 170(H) 168(H) 139(H)  BUN 6 - 20 mg/dL 43(H) 41(H) 38(H)  Creatinine 0.44 - 1.00 mg/dL 2.47(H) 2.99(H) 3.28(H)  Sodium 135 - 145 mmol/L 135 136 136  Potassium 3.5 - 5.1 mmol/L 3.6 3.6 3.7  Chloride 101 - 111 mmol/L 102 101 103  CO2 22 - 32 mmol/L 28 29 29   Calcium 8.9 - 10.3 mg/dL 8.5(L) 8.2(L)  8.2(L)    CBC Latest Ref Rng 05/20/2015 05/19/2015 05/18/2015  WBC 3.6 - 11.0 K/uL 9.9 12.3(H) 11.8(H)  Hemoglobin 12.0 - 16.0 g/dL 7.6(L) 7.4(L) 7.7(L)  Hematocrit 35.0 - 47.0 % 23.2(L) 23.1(L) 23.9(L)  Platelets 150 - 440 K/uL 129(L) 144(L) 155    CXR: slightly improved edema pattern  IMPRESSION:64 yo white female admitted for acute liver and renal failure with cardiogenic pulm edema with afib with ALI Patient self extubated last night, now struggling to breathe, patient conveyed to family to let her go, husband wants to re-intubate patient and assess for another  trial of extubation   1) Recurrent acute on chronic hypercarbic and  hypoxic respiratory failure with bilateral pulmonary infiltrates   Suspect component of impaired ventilatory drive 2) PAF <> NSR <> sinus brady <> Mobitz II 3) Hypotension requiring vasopressors - improving.  4) AKI on CKD, nonoliguric. 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 No overt blood loss 7) Toxic-metabolic encephalopathy, ICU acquired delirium 8) hypothyroidism, TSH 3.5 12/25   PLAN/REC: 1.Respiratory Failure-will place back on vent once patient talks with family   2.Septic shock -use vasopressors to keep MAP>65 -follow ABG and LA -follow up cultures -empiric ABX  Prognosis is very poor, patient with Multiorgan failure. Patient at very high risk for cardiac arrest and death Plan: once patient has been told by family  to place back on vent, will place DNR orders and plan for trial for another extubation    The Patient requires high complexity decision making for assessment and support, frequent evaluation and titration of therapies, application of advanced monitoring technologies and extensive interpretation of multiple databases. Critical Care Time devoted to patient care services described in this note is 55 minutes.   Overall, patient is critically  ill, prognosis is guarded.  Patient with Multiorgan failure and at high risk for cardiac arrest and death.    Corrin Parker, M.D.  [facility]  Medical Director [place] Director [facility] Cardio-Pulmonary Department

## 2015-05-20 NOTE — Progress Notes (Signed)
Pharmacy Consult for Electrolyte Monitoring and Replacement  Allergies  Allergen Reactions  . Codeine Anaphylaxis  . Arava [Leflunomide] Other (See Comments)    Pt states that this medication causes liver damage.   . Atorvastatin Other (See Comments)    Pt states that this medication causes liver damage.   . Humira [Adalimumab] Other (See Comments)    Pt states that this medication causes liver damage.   . Methotrexate Derivatives Other (See Comments)    Pt states that this medication causes liver damage.   . Simvastatin Other (See Comments)    Pt states that this medication causes liver damage and joint pain.     Patient Measurements: Height: 5\' 4"  (162.6 cm) Weight: 206 lb 9.1 oz (93.7 kg) IBW/kg (Calculated) : 54.7  Vital Signs: Temp: 98.8 F (37.1 C) (12/28 0400) Temp Source: Oral (12/28 0400) BP: 122/82 mmHg (12/28 1000) Pulse Rate: 90 (12/28 1000) Intake/Output from previous day: 12/27 0701 - 12/28 0700 In: 1712.1 [I.V.:897.1; NG/GT:765; IV Piggyback:50] Out: 1320 [Urine:1095; Stool:225] Intake/Output from this shift: Total I/O In: 41.4 [I.V.:41.4] Out: -   Labs:  Recent Labs  05/18/15 0449 05/19/15 0401 05/20/15 0413  WBC 11.8* 12.3* 9.9  HGB 7.7* 7.4* 7.6*  HCT 23.9* 23.1* 23.2*  PLT 155 144* 129*     Recent Labs  05/18/15 0449 05/18/15 1206 05/18/15 2017 05/19/15 0401 05/20/15 0413  NA 141 136  --  136 135  K 3.2* 3.7  --  3.6 3.6  CL 104 103  --  101 102  CO2 31 29  --  29 28  GLUCOSE 125* 139*  --  168* 170*  BUN 34* 38*  --  41* 43*  CREATININE 3.20* 3.28*  --  2.99* 2.47*  CALCIUM 8.5* 8.2*  --  8.2* 8.5*  MG 2.2  --   --   --   --   PHOS 3.6  --   --   --   --   PROT  --   --  7.8 7.8  --   ALBUMIN  --   --  2.6* 2.4*  --   AST  --   --  37 33  --   ALT  --   --  17 16  --   ALKPHOS  --   --  49 48  --   BILITOT  --   --  1.3* 1.3*  --   BILIDIR  --   --  0.4  --   --   IBILI  --   --  0.9  --   --   TRIG  --   --   --  56  --     Estimated Creatinine Clearance: 26.2 mL/min (by C-G formula based on Cr of 2.47).    Recent Labs  05/19/15 2011 05/20/15 0004 05/20/15 0356  GLUCAP 139* 143* 152*    Medical History: Past Medical History  Diagnosis Date  . Allergy   . GERD (gastroesophageal reflux disease)   . CAD (coronary artery disease)   . Hyperlipidemia   . Thyroid disease   . Osteoporosis   . Rheumatoid arthritis ([place])   . Esophageal varices ([place]) 09/22/2014    April, 2016-grade 2-3 varices  . Hypothyroidism   . Cirrhosis ([place])     Medications:  Scheduled:  . antiseptic oral rinse  7 mL Mouth Rinse BID  . antiseptic oral rinse  7 mL Mouth Rinse QID  . lactulose  20 g  Per Tube Daily  . levothyroxine  100 mcg Per Tube Q0600  . pantoprazole sodium  40 mg Per Tube Q1200  . rifaximin  550 mg Per Tube BID  . sevelamer carbonate  1.6 g Oral TID WC  . sodium chloride  3 mL Intravenous Q12H   Infusions:  . norepinephrine (LEVOPHED) Adult infusion    . vasopressin (PITRESSIN) infusion - *FOR SHOCK*      Assessment: Pharmacy consulted to assist in managing electrolytes in this 64 y/o F with ARF currently requiring HD.    Plan:  Electrolytes are wnl. Will f/u am labs.    Ulice Dash D, Pharm.D., BCPS Clinical Pharmacist 05/20/2015,12:45 PM

## 2015-05-20 NOTE — Progress Notes (Signed)
Dr. Mar Daring at bedside and spoke with family and patient.  When questioned patient does not want to wear BiPAP and does not want to be intubated if needed.  Patient wrote on envelope in front of family "let me go".  Dr. Mortimer Fries notified.  Family meeting organized.

## 2015-05-20 NOTE — Progress Notes (Signed)
Pt agitated and threw herself over on to her left side, dislodged the ETT. Respiratory in, pt sats 96-98, placed on 3L [place]. Elink notified of extubation. Precedex discontinued, will monitor pt closely.

## 2015-05-20 NOTE — Progress Notes (Signed)
Discussed with Dr. Mortimer Fries patient respiratory status (labored breathing, diminished breath sounds, and weak cough) and self extubation.  Verbal order obtained for STAT ABG.

## 2015-05-20 NOTE — Care Management (Signed)
She declined reintubation.  She is now DNR and comfort care.    Family is at bedside and patient is actively dying.  There is a consult for CSW for comfort care.  This consult is not needed.

## 2015-05-20 NOTE — Progress Notes (Signed)
   05/20/15 1400  Clinical Encounter Type  Visited With Family  Visit Type Initial  Referral From Nurse  Consult/Referral To Chaplain  Spiritual Encounters  Spiritual Needs Emotional;Grief support  Received order for end of life. Spoke with family who asked I return in a little while. More family is due to arrive. I will return in about 30 minutes. [place]

## 2015-05-20 NOTE — Progress Notes (Signed)
[facility] Progress Note Patient Name: Jean Tucker DOB: [birth year] MRN: [id number]   Date of Service  05/20/2015  HPI/Events of Note  Patient self extubated. Now on 3 L/min [place] O2 with sat = 96%. RR = 26. Patient has been on PSV all day. Will give a trial to see if she can remain extubated.   eICU Interventions  Will order: 1. Oxygen by nasal canula. Keep sat 88% to 90%. 2. Incentive spirometry Q 1 hour while awake.  3. D/C Precedex IV infusion.  4. ABG at 2:45 AM.      Intervention Category Major Interventions: Respiratory failure - evaluation and management  [name] 05/20/2015, 1:45 AM

## 2015-05-20 NOTE — Progress Notes (Signed)
Nutrition Follow-up    INTERVENTION:   Coordination of Care: follow poc; if pt re-intubated, recommend restarting TF. If pt remains extubated, recommend diet advancement as medically able with addition of nutritional supplements and/or snacks between meals as pt with poor po intake prior to intubation   NUTRITION DIAGNOSIS:   Inadequate oral intake related to acute illness as evidenced by per patient/family report.  GOAL:   Patient will meet greater than or equal to 90% of their needs  MONITOR:    (Energy intake, Electrolyte and renal profile)  REASON FOR ASSESSMENT:   Consult Poor PO  ASSESSMENT:    Pt s/p self-extubation this AM, currently on Bipap, ABG pending  Diet Order:   NPO  EN: TF discontinued with extubation as pt removed OG as well  Digestive System: rectal tube in place  Electrolyte and Renal Profile:  Recent Labs Lab 05/16/15 0527 05/17/15 0438 05/18/15 0449 05/18/15 1206 05/19/15 0401 05/20/15 0413  BUN 28* 34* 34* 38* 41* 43*  CREATININE 3.65* 3.75* 3.20* 3.28* 2.99* 2.47*  NA 138 141 141 136 136 135  K 4.9 3.4* 3.2* 3.7 3.6 3.6  MG 2.7* 2.2 2.2  --   --   --   PHOS 8.2* 3.9 3.6  --   --   --    Glucose Profile:  Recent Labs  05/19/15 2011 05/20/15 0004 05/20/15 0356  GLUCAP 139* 143* 152*   Meds: lactulose, xifanax, renvela, neosynephrine and vasopressin  Height:   Ht Readings from Last 1 Encounters:  05/11/15 5\' 4"  (1.626 m)    Weight:   Wt Readings from Last 1 Encounters:  05/20/15 206 lb 9.1 oz (93.7 kg)    BMI:  Body mass index is 35.44 kg/(m^2).  Estimated Nutritional Needs:   Kcal:  (11-14 kcals/kg) Using 92kg.  [id number] kcals/d.  Protein:  (2.0-2.5 g/kg) Using IBW of 55kg 110-138 g/d   Fluid:  1088ml +UOP  EDUCATION NEEDS:   No education needs identified at this time  [place], RD, LDN [phone] Pager  [phone] Weekend/On-Call Pager

## 2015-05-20 NOTE — Progress Notes (Signed)
Clinical Education officer, museum (CSW) received consult for comfort care. Per RN Case Manager there are no social work needs at this time. Please reconsult if future social work needs arise. CSW signing off.   Blima Rich, [place] [phone]

## 2015-05-20 NOTE — Progress Notes (Signed)
Dr. Mortimer Fries meeting with family about goals of treatment.

## 2015-05-20 NOTE — Progress Notes (Signed)
Jenn[facility] at [place] NAME: Jean Tucker    MR#:  [id number]  DATE OF BIRTH:  1950/09/08    CHIEF COMPLAINT:   Chief Complaint  Patient presents with  . Shortness of Breath   Patient is extubated 12/28, placed on BiPAP, but working hard to breathe, asking to take the mask off as she is not comfortable with it, patient   is not comfortable with the masks, tubes and pipes, refusing breathing machine .Husband at bedside Patient is conveying to family members to let her go,husband is in tears with broken heart  REVIEW OF SYSTEMS:   ROS is alert and oriented,on BiPAP,unable to obtain review of systems  DRUG ALLERGIES:   Allergies  Allergen Reactions  . Codeine Anaphylaxis  . Arava [Leflunomide] Other (See Comments)    Pt states that this medication causes liver damage.   . Atorvastatin Other (See Comments)    Pt states that this medication causes liver damage.   . Humira [Adalimumab] Other (See Comments)    Pt states that this medication causes liver damage.   . Methotrexate Derivatives Other (See Comments)    Pt states that this medication causes liver damage.   . Simvastatin Other (See Comments)    Pt states that this medication causes liver damage and joint pain.     VITALS:  Blood pressure 122/82, pulse 90, temperature 98.8 F (37.1 C), temperature source Oral, resp. rate 28, height 5\' 4"  (1.626 m), weight 93.7 kg (206 lb 9.1 oz), SpO2 96 %.  PHYSICAL EXAMINATION:  GENERAL:  64 y.o.-year-old patient  Critically ill LUNGS: extubated, on BiPAP, coarse ventilated breath sounds, working hard and using accessory muscles, uncomfortable with the mask CARDIOVASCULAR: S1, S2 normal. No murmurs, rubs, or gallops. Tachycardic, irregular intermittently ABDOMEN: Soft, nontender, nondistended. Bowel sounds present. No organomegaly or mass.  EXTREMITIES: No cyanosis, or clubbing. +1 bilateral pedal edema NEUROLOGIC: Moving all 4  extremities spontaneously, no focal defects PSYCHIATRIC: alert and oriented SKIN: No obvious rash, lesion, or ulcer.   LABORATORY PANEL:   CBC  Recent Labs Lab 05/20/15 0413  WBC 9.9  HGB 7.6*  HCT 23.2*  PLT 129*   ------------------------------------------------------------------------------------------------------------------  Chemistries   Recent Labs Lab 05/18/15 0449  05/19/15 0401 05/20/15 0413  NA 141  < > 136 135  K 3.2*  < > 3.6 3.6  CL 104  < > 101 102  CO2 31  < > 29 28  GLUCOSE 125*  < > 168* 170*  BUN 34*  < > 41* 43*  CREATININE 3.20*  < > 2.99* 2.47*  CALCIUM 8.5*  < > 8.2* 8.5*  MG 2.2  --   --   --   AST  --   < > 33  --   ALT  --   < > 16  --   ALKPHOS  --   < > 48  --   BILITOT  --   < > 1.3*  --   < > = values in this interval not displayed. ------------------------------------------------------------------------------------------------------------------  Cardiac Enzymes No results for input(s): TROPONINI in the last 168 hours. ------------------------------------------------------------------------------------------------------------------  RADIOLOGY:  Dg Chest Port 1 View  05/20/2015  CLINICAL DATA:  Respiratory failure. EXAM: PORTABLE CHEST 1 VIEW COMPARISON:  05/19/2015. FINDINGS: Interim extubation removal of endotracheal tube and NG tube. Cardiomegaly with diffuse pulmonary venous congestion and bilateral interstitial prominence with bilateral pleural effusions. Findings are progressed from prior exam. Findings consistent congestive  heart failure. Bilateral pneumonia cannot be excluded. No pneumothorax. Postsurgical changes right shoulder . IMPRESSION: 1.  Interim removal of endotracheal tube and NG tube. 2. Congestive heart failure with bilateral pulmonary edema and small left pleural effusion. Changes have progressed from prior exam . Electronically Signed   By: Wolverine   On: 05/20/2015 07:34   Dg Chest Port 1 View  05/19/2015   CLINICAL DATA:  Respiratory failure. EXAM: PORTABLE CHEST 1 VIEW COMPARISON:  05/18/2015. FINDINGS: Endotracheal tube, NG tube in stable position. Persistent cardiomegaly. Slight improvement of bilateral pulmonary infiltrates and or edema. Small bilateral pleural effusions again noted with slight improvement. No pneumothorax. IMPRESSION: 1.  Lines and tubes in stable position. 2. Stable severe cardiomegaly. Persistent but slightly improved bilateral pulmonary infiltrates and or edema. Interim improvement of small bilateral pleural effusions. Electronically Signed   By: Marcello Moores  Register   On: 05/19/2015 07:33    EKG:   Orders placed or performed during the hospital encounter of 05/23/2015  . EKG 12-Lead  . EKG 12-Lead  . ED EKG  . ED EKG  . EKG 12-Lead  . EKG 12-Lead  . EKG 12-Lead  . 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 She was initially admitted at [facility] on 12/1 for hematemesis due to ruptured esophageal varices. She was transferred to [facility] on 12/2 and had variceal banding and TIPS procedure on 12/3. Her hospitalization at [facility] was compensated by severe hepatic encephalopathy and also renal failure thought to be due to ATN after contrast exposure, hypovolemia in the setting of blood loss and possibly also hepatorenal syndrome. She left [facility] AMA on 12/15 and presented to [facility] on 12/16 with respiratory distress.  1. 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 Hypothyroidism continue Synthroid.  5 history of rheumatoid arthritis: Intolerant to methotrexate, Humira, ARava  6. Atrial fibrillation: Has had tachycardia and bradycardia arrhythmias overnight - Cardiology consultation is still pending for 12/26 - Continue metoprolol  7. Hypotension- better - Continue pressor support as  Necessary and IV fluids  Family has requested transfer to [place]. Dr.walsh has  reviewed her [facility] charts with Dr. Ruthann Cancer intensivist at [facility] on 05/18/15. There are no ICU beds at [facility], he has not accepted the patient, he does not feel that he would change current management. Husband updated.   Today I had another lengthy  discussion with the husband, daughter and other significant family members,aware of the situation, husband who is the healthcare power of attorney is aware that she is refusing to be reintubated as well as BiPAP and other mask she's conveying the family members to let her go. Daughter is agreeable with DO NOT RESUSCITATE, husband is  asking for more time to think about it, will make a decision soon  He is  in positive spirits  CODE STATUS: DNR - CODE STATUS changed by Dr. Mortimer Fries  after discussing with the family members.   All the records are reviewed and case discussed with Care Management/Social Workerr. Management plans discussed with the patient, family and they are in agreement.  CODE STATUS: full  TOTAL critical care TIME TAKING CARE OF THIS PATIENT 35 minutes.   POSSIBLE D/C IN ? DAYS, DEPENDING ON CLINICAL CONDITION.   Nicholes Mango M.D on 05/20/2015 at 3:10 PM  Between 7am to 6pm - Pager - [phone]  After 6pm go to [email or web address] - password [email or web address] [facility]  Office   [phone]  CC: Primary care physician; Loura Pardon, MD   Note: This dictation was prepared with Dragon dictation along with smaller phrase technology. Any transcriptional errors that result from this process are unintentional.

## 2015-05-20 NOTE — Progress Notes (Signed)
[facility]  ROUNDING NOTE   Subjective:   Creatinine down to 2.47. Good urine output of 1.4 L noted over the past 3 shifts. Now extubated and able to follow commands this a.m.   Objective:  Vital signs in last 24 hours:  Temp:  [97.7 F (36.5 C)-98.8 F (37.1 C)] 98.8 F (37.1 C) (12/28 0400) Pulse Rate:  [53-124] 83 (12/28 0600) Resp:  [9-34] 29 (12/28 0600) BP: (81-143)/(55-91) 126/73 mmHg (12/28 0600) SpO2:  [91 %-100 %] 94 % (12/28 0600) FiO2 (%):  [30 %-40 %] 32 % (12/28 0221) Weight:  [93.7 kg (206 lb 9.1 oz)] 93.7 kg (206 lb 9.1 oz) (12/28 0415)  Weight change: 0.8 kg (1 lb 12.2 oz) Filed Weights   05/18/15 0435 05/19/15 0500 05/20/15 0415  Weight: 91.6 kg (201 lb 15.1 oz) 92.9 kg (204 lb 12.9 oz) 93.7 kg (206 lb 9.1 oz)    Intake/Output: I/O last 3 completed shifts: In: 3221.9 [I.V.:1696.9; NG/GT:1475; IV Piggyback:50] Out: 2090 [Urine:1465; Stool:625]   Intake/Output this shift:     Physical Exam: General:  now extubated  HENT  [place]/AT hearing intact OM moist  Eyes: Anicteric  Neck: Supple  Lungs:  Bilateral rhonchi, normal effort  Heart: irregular rate and rhythm, A Fib noted  Abdomen:  Soft, nontender, mildly distended  Extremities: 1+ dependent and peripheral edema.  Neurologic: Awake, alert, following commands  Skin: No lesions  Access: Right femoral vascath 12/16 Dr. Stevenson Clinch.   Foley  Basic Metabolic Panel:  Recent Labs Lab 05/14/15 0520 05/15/15 0528 05/16/15 0527 05/17/15 0438 05/18/15 0449 05/18/15 1206 05/19/15 0401 05/20/15 0413  NA 141 138 138 141 141 136 136 135  K 3.7 3.8 4.9 3.4* 3.2* 3.7 3.6 3.6  CL 101 99* 97* 104 104 103 101 102  CO2 32 34* 35* 30 31 29 29 28   GLUCOSE 132* 196* 186* 132* 125* 139* 168* 170*  BUN 29* 28* 28* 34* 34* 38* 41* 43*  CREATININE 3.53* 3.55* 3.65* 3.75* 3.20* 3.28* 2.99* 2.47*  CALCIUM 8.6* 8.7* 8.9 8.2* 8.5* 8.2* 8.2* 8.5*  MG 1.7 1.6* 2.7* 2.2 2.2  --   --   --   PHOS 4.6 5.5* 8.2*  3.9 3.6  --   --   --     Liver Function Tests:  Recent Labs Lab 05/15/15 0528 05/16/15 0527 05/18/15 2017 05/19/15 0401  AST 33 36 37 33  ALT 16 19 17 16   ALKPHOS 49 54 49 48  BILITOT 1.1 1.4* 1.3* 1.3*  PROT 8.1 8.8* 7.8 7.8  ALBUMIN 2.6* 2.7* 2.6* 2.4*   No results for input(s): LIPASE, AMYLASE in the last 168 hours.  Recent Labs Lab 05/15/15 0528 05/15/15 1359 05/18/15 2017  AMMONIA 47* 38* 32    CBC:  Recent Labs Lab 05/16/15 0527 05/17/15 0438 05/18/15 0449 05/19/15 0401 05/20/15 0413  WBC 12.9* 13.6* 11.8* 12.3* 9.9  HGB 8.4* 7.2* 7.7* 7.4* 7.6*  HCT 28.0* 22.0* 23.9* 23.1* 23.2*  MCV 96.9 91.4 92.6 91.2 93.0  PLT 189 140* 155 144* 129*    Cardiac Enzymes: No results for input(s): CKTOTAL, CKMB, CKMBINDEX, TROPONINI in the last 168 hours.  BNP: Invalid input(s): POCBNP  CBG:  Recent Labs Lab 05/19/15 1128 05/19/15 1600 05/19/15 2011 05/20/15 0004 05/20/15 0356  GLUCAP 146* 152* 139* 143* 152*    Microbiology: Results for orders placed or performed during the hospital encounter of 05/02/2015  Culture, blood (Routine X 2) w Reflex to ID Panel  Status: None   Collection Time: 04/25/2015  2:18 AM  Result Value Ref Range Status   Specimen Description BLOOD LEFT ASSIST CONTROL  Final   Special Requests BOTTLES DRAWN AEROBIC AND ANAEROBIC 5 CC  Final   Culture NO GROWTH 5 DAYS  Final   Report Status 05/13/2015 FINAL  Final  Culture, blood (Routine X 2) w Reflex to ID Panel     Status: None   Collection Time: 05/01/2015  2:18 AM  Result Value Ref Range Status   Specimen Description BLOOD RIGHT ASSIST CONTROL  Final   Special Requests BOTTLES DRAWN AEROBIC AND ANAEROBIC 1 CC  Final   Culture NO GROWTH 5 DAYS  Final   Report Status 05/13/2015 FINAL  Final  Urine culture     Status: None   Collection Time: 05/10/2015  6:05 AM  Result Value Ref Range Status   Specimen Description URINE, RANDOM  Final   Special Requests NONE  Final   Culture  50,000 COLONIES/mL CANDIDA ALBICANS  Final   Report Status 05/11/2015 FINAL  Final  C difficile quick scan w PCR reflex     Status: None   Collection Time: 05/17/15  9:13 AM  Result Value Ref Range Status   C Diff antigen NEGATIVE NEGATIVE Final   C Diff toxin NEGATIVE NEGATIVE Final   C Diff interpretation Negative for C. difficile  Final    Coagulation Studies: No results for input(s): LABPROT, INR in the last 72 hours.  Urinalysis: No results for input(s): COLORURINE, LABSPEC, PHURINE, GLUCOSEU, HGBUR, BILIRUBINUR, KETONESUR, PROTEINUR, UROBILINOGEN, NITRITE, LEUKOCYTESUR in the last 72 hours.  Invalid input(s): APPERANCEUR    Imaging: Dg Chest Port 1 View  05/19/2015  CLINICAL DATA:  Respiratory failure. EXAM: PORTABLE CHEST 1 VIEW COMPARISON:  05/18/2015. FINDINGS: Endotracheal tube, NG tube in stable position. Persistent cardiomegaly. Slight improvement of bilateral pulmonary infiltrates and or edema. Small bilateral pleural effusions again noted with slight improvement. No pneumothorax. IMPRESSION: 1.  Lines and tubes in stable position. 2. Stable severe cardiomegaly. Persistent but slightly improved bilateral pulmonary infiltrates and or edema. Interim improvement of small bilateral pleural effusions. Electronically Signed   By: Marcello Moores  Register   On: 05/19/2015 07:33     Medications:   . phenylephrine (NEO-SYNEPHRINE) Adult infusion 30 mcg/min (05/20/15 0433)  . vasopressin (PITRESSIN) infusion - *FOR SHOCK* 0.03 Units/min (05/19/15 1154)   . antiseptic oral rinse  7 mL Mouth Rinse BID  . antiseptic oral rinse  7 mL Mouth Rinse QID  . chlorhexidine gluconate  15 mL Mouth Rinse BID  . feeding supplement (PRO-STAT SUGAR FREE 64)  30 mL Oral BID  . feeding supplement (VITAL HIGH PROTEIN)  1,000 mL Per Tube Q24H  . free water  100 mL Per Tube 3 times per day  . heparin subcutaneous  5,000 Units Subcutaneous Q12H  . lactulose  20 g Per Tube Daily  . levothyroxine  100 mcg  Per Tube Q0600  . pantoprazole sodium  40 mg Per Tube Q1200  . rifaximin  550 mg Per Tube BID  . sevelamer carbonate  1.6 g Oral TID WC  . sodium chloride  3 mL Intravenous Q12H   acetaminophen, alteplase, fentaNYL (SUBLIMAZE) injection, heparin, ipratropium-albuterol, lidocaine (PF), lidocaine-prilocaine, metoprolol, ondansetron **OR** ondansetron (ZOFRAN) IV, pentafluoroprop-tetrafluoroeth  Assessment/ Plan:  Jean Tucker is a 64 y.o. 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  1. Acute renal failure: likely ATN after TIPS procedure. Hemodialysis 12/16 due to worsening renal function.   Baseline creatinine of 0.85 from 12/2 - Creatinine continues to trend down and is currently 2.47. Good urine output noted over the past 24 hours. No acute indication for dialysis.  Okay to remove dialysis catheter from our perspective if needed.  2. 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  - Mental status appears to be improved. She is awake and following commands this a.m.  3. Acute resp failure from Pneumonia and Acute pulmonary edema.  Intubated 05/16/15, extubated 05/19/15. Thus far doing well post extubation. Pulmonary critical care following on the case as well.  4. Generalized edema Better urine output noted over the past 24 hours. Okay to give Lasix on an as-needed basis.    LOS: 12 Wafaa Deemer 12/28/20167:29 AM

## 2015-05-20 NOTE — Progress Notes (Signed)
Pt transferred to Rm 128 for comfort care.

## 2015-05-20 NOTE — Care Management (Signed)
Patient was transferred back to icu 12/24 and intubated.  she self extubated herself early morning this day.  She is currently experiencing weak cough and ABG is pending.  She is off precedex drip

## 2015-05-20 NOTE — Progress Notes (Signed)
Patient appears to more comfortable after fentanyl dose.  Nurse will continue to monitor.

## 2015-05-24 NOTE — Plan of Care (Signed)
Time of Death was 5:30.  2nd nurse verification was Wiliam Ke.  I was present at passing.  Pt had agonal breathing, but looked peaceful.  Husband and daughter were present as well.  Prime doc was called and Conservation officer, historic buildings.  Charge Nurse called [facility].

## 2015-05-24 NOTE — Progress Notes (Signed)
[place] at [place] NAME: Jean Tucker    MR#:  [id number]  DATE OF BIRTH:  07-24-1950    CHIEF COMPLAINT:   Chief Complaint  Patient presents with  . Shortness of Breath   Patient is extubated 12/28, placed on BiPAP, pt refused BIPAP and husband and family changed her code status to DNR with comfortcare Today pt is resting comfortably, husband at bedside, very disappointed and sad  REVIEW OF SYSTEMS:   ROS is unobtainable as pt is sedated  DRUG ALLERGIES:   Allergies  Allergen Reactions  . Codeine Anaphylaxis  . Arava [Leflunomide] Other (See Comments)    Pt states that this medication causes liver damage.   . Atorvastatin Other (See Comments)    Pt states that this medication causes liver damage.   . Humira [Adalimumab] Other (See Comments)    Pt states that this medication causes liver damage.   . Methotrexate Derivatives Other (See Comments)    Pt states that this medication causes liver damage.   . Simvastatin Other (See Comments)    Pt states that this medication causes liver damage and joint pain.     VITALS:  Blood pressure 137/82, pulse 102, temperature 98.8 F (37.1 C), temperature source Oral, resp. rate 23, height 5\' 4"  (1.626 m), weight 93.7 kg (206 lb 9.1 oz), SpO2 98 %.  PHYSICAL EXAMINATION:  GENERAL:  65 y.o.-year-old patient  Critically ill LUNGS: extubated, off BiPAP, coarse ventilated breath sounds,  Resting comfortably CARDIOVASCULAR: S1, S2 normal. No murmurs, rubs, or gallops.  ABDOMEN: Soft, nontender, nondistended. Bowel sounds prestent EXTREMITIES: No cyanosis, or clubbing. +1 bilateral pedal edema NEUROLOGIC:resting comfortably LABORATORY PANEL:   CBC  Recent Labs Lab 05/20/15 0413  WBC 9.9  HGB 7.6*  HCT 23.2*  PLT 129*   ------------------------------------------------------------------------------------------------------------------  Chemistries   Recent Labs Lab  05/18/15 0449  05/19/15 0401 05/20/15 0413  NA 141  < > 136 135  K 3.2*  < > 3.6 3.6  CL 104  < > 101 102  CO2 31  < > 29 28  GLUCOSE 125*  < > 168* 170*  BUN 34*  < > 41* 43*  CREATININE 3.20*  < > 2.99* 2.47*  CALCIUM 8.5*  < > 8.2* 8.5*  MG 2.2  --   --   --   AST  --   < > 33  --   ALT  --   < > 16  --   ALKPHOS  --   < > 48  --   BILITOT  --   < > 1.3*  --   < > = values in this interval not displayed. ------------------------------------------------------------------------------------------------------------------  Cardiac Enzymes No results for input(s): TROPONINI in the last 168 hours. ------------------------------------------------------------------------------------------------------------------  RADIOLOGY:  Dg Chest Port 1 View  05/20/2015  CLINICAL DATA:  Respiratory failure. EXAM: PORTABLE CHEST 1 VIEW COMPARISON:  05/19/2015. FINDINGS: Interim extubation removal of endotracheal tube and NG tube. Cardiomegaly with diffuse pulmonary venous congestion and bilateral interstitial prominence with bilateral pleural effusions. Findings are progressed from prior exam. Findings consistent congestive heart failure. Bilateral pneumonia cannot be excluded. No pneumothorax. Postsurgical changes right shoulder . IMPRESSION: 1.  Interim removal of endotracheal tube and NG tube. 2. Congestive heart failure with bilateral pulmonary edema and small left pleural effusion. Changes have progressed from prior exam . Electronically Signed   By: [place]   On: 05/20/2015 07:34    EKG:  Orders placed or performed during the hospital encounter of 05/19/2015  . EKG 12-Lead  . EKG 12-Lead  . ED EKG  . ED EKG  . EKG 12-Lead  . EKG 12-Lead  . EKG 12-Lead  . 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 She was initially admitted at [facility] on 12/1 for hematemesis due to ruptured esophageal varices. She was  transferred to [facility] on 12/2 and had variceal banding and TIPS procedure on 12/3. Her hospitalization at [facility] was compensated by severe hepatic encephalopathy and also renal failure thought to be due to ATN after contrast exposure, hypovolemia in the setting of blood loss and possibly also hepatorenal syndrome. She left [facility] AMA on 12/15 and presented to [facility] on 12/16 with respiratory distress.  1. 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 Hypothyroidism .  5 history of rheumatoid arthritis:   6. Atrial fibrillation:    CODE STATUS: DNR  With comfort care   All the records are reviewed and case discussed with Care Management/Social Workerr. Management plans discussed with the patient, family and they are in agreement.  CODE STATUS: full  TOTAL critical care TIME TAKING CARE OF THIS PATIENT 35 minutes.   POSSIBLE D/C IN ? DAYS, DEPENDING ON CLINICAL CONDITION.   Nicholes Mango M.D on Jun 04, 2015 at 6:08 PM  Between 7am to 6pm - Pager - [phone]  After 6pm go to [email or web address] - password [email or web address] [facility]  Office  [phone]  CC: Primary care physician; Loura Pardon, MD   Note: This dictation was prepared with Dragon dictation along with smaller phrase technology. Any transcriptional errors that result from this process are unintentional.

## 2015-05-24 NOTE — Discharge Summary (Signed)
[place] at [place] NAME: Jean Tucker    MR#:  [id number]  DATE OF BIRTH:  08-12-1950  DATE OF ADMISSION:  04/23/2015 ADMITTING PHYSICIAN: Harrie Foreman, MD  DATE OF Death : 2015-06-13 PRIMARY CARE PHYSICIAN: Loura Pardon, MD    ADMISSION DIAGNOSIS:  HCAP (healthcare-associated pneumonia) [J18.9] Acute renal failure, unspecified acute renal failure type ([place]) [N17.9]  DISCHARGE DIAGNOSIS:  Active Problems:   HCAP (healthcare-associated pneumonia)   Acute renal failure (ARF) ([place])   Acute encephalopathy  acute respiratory failure  SECONDARY DIAGNOSIS:   Past Medical History  Diagnosis Date  . Allergy   . GERD (gastroesophageal reflux disease)   . CAD (coronary artery disease)   . Hyperlipidemia   . Thyroid disease   . Osteoporosis   . Rheumatoid arthritis ([place])   . Esophageal varices ([place]) 09/22/2014    April, 2016-grade 2-3 varices  . Hypothyroidism   . 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 She was initially admitted at [facility] on 12/1 for hematemesis due to ruptured esophageal varices. She was transferred to [facility] on 12/2 and had variceal banding and TIPS procedure on 12/3. Her hospitalization at [facility] was compensated by severe hepatic encephalopathy and also renal failure thought to be due to ATN after contrast exposure, hypovolemia in the setting of blood loss and possibly also hepatorenal syndrome. She left [facility] AMA on 12/15 and presented to [facility] on 12/16 with respiratory distress.  1. Acute hypercarbic respiratory failure: - extubated , 05/20/2015  -Pt didn't do well on BiPAP, husband changed her code status to DNR with comfort care  #3 Acute renal failure due to ATN after T IPS procedure -comfort care   #3 autoimmune cirrhosis of the liver with variceal bleed status post banding and TIPS   #4.  Hypothyroidism .  5 history of rheumatoid arthritis:   6. Atrial fibrillation:   Patient deceased on 2015-06-13 at 5:30 PM,peacefully. Husband and daughter were at bedside  DISCHARGE CONDITIONS:   Patient deceased  CONSULTS OBTAINED:  Treatment Team:  Anthonette Legato, MD Isaias Cowman, MD Corey Skains, MD   PROCEDURES intubated and got extubated  DRUG ALLERGIES:   Allergies  Allergen Reactions  . Codeine Anaphylaxis  . Arava [Leflunomide] Other (See Comments)    Pt states that this medication causes liver damage.   . Atorvastatin Other (See Comments)    Pt states that this medication causes liver damage.   . Humira [Adalimumab] Other (See Comments)    Pt states that this medication causes liver damage.   . Methotrexate Derivatives Other (See Comments)    Pt states that this medication causes liver damage.   . Simvastatin Other (See Comments)    Pt states that this medication causes liver damage and joint pain.     DISCHARGE MEDICATIONS:   Patient deceased   DISCHARGE INSTRUCTIONS:   Patient deceased  DIET:  Patient deceased  DISCHARGE CONDITION:  Expired  ACTIVITY:    OXYGEN:   DISCHARGE LOCATION:  Patient deceased If you experience worsening of your admission symptoms, develop shortness of breath, life threatening emergency, suicidal or homicidal thoughts you must seek medical attention immediately by calling 911 or calling your MD immediately  if symptoms less severe.  You Must read complete instructions/literature along with all the possible adverse reactions/side effects for all the Medicines you take and that have  been prescribed to you. Take any new Medicines after you have completely understood and accpet all the possible adverse reactions/side effects.   Please note  You were cared for by a hospitalist during your hospital stay. If you have any questions about your discharge medications or the care you received while you were in the  hospital after you are discharged, you can call the unit and asked to speak with the hospitalist on call if the hospitalist that took care of you is not available. Once you are discharged, your primary care physician will handle any further medical issues. Please note that NO REFILLS for any discharge medications will be authorized once you are discharged, as it is imperative that you return to your primary care physician (or establish a relationship with a primary care physician if you do not have one) for your aftercare needs so that they can reassess your need for medications and monitor your lab values.

## 2015-05-24 NOTE — Progress Notes (Signed)
Nutrition Brief Note  Chart reviewed. Pt now transitioning to comfort care.  No further nutrition interventions warranted at this time.  Please re-consult as needed.   Kavontae Pritchard, RD, LDN Pager [phone] Weekend/On-Call Pager [phone]   

## 2015-05-24 NOTE — Plan of Care (Signed)
Problem: Education: Goal: Knowledge of the prescribed therapeutic regimen will improve Outcome: Progressing Pt transferred from CCU for comfort care this shift. Family at bedside and expresses understanding of pt's condition.   Problem: Role Relationship: Goal: Family's ability to cope with current situation will improve Outcome: Progressing Pt's family at bedside. Husband is having more difficult time with coping than other family members. Support provided. [employer] services offered. Husband declined.  Goal: Ability to verbalize concerns, feelings, and thoughts to partner or family member will improve Outcome: Progressing Pt's family at bedside and participating in care of pt.  Goal: Ability to express an understanding of role expectations in relation to illness will improve Outcome: Progressing Pt medicated throughout the shift for dyspnea and anxiety.

## 2015-05-24 DEATH — deceased

## 2015-05-28 NOTE — Procedures (Signed)
ELECTROENCEPHALOGRAM REPORT   Patient: Jean Tucker       Room #: ICU  Age: 65 y.o.        Sex: female Referring Physician: Alva Garnet Report Date:  05/28/2015        Interpreting Physician: Alexis Goodell  History: ALYSAN HERRELL is an 65 y.o. female with respiratory distress  Medications:  Propofol, Fentanyl  Conditions of Recording:  This is a 16 channel EEG carried out with the patient in the intubated and sedated state.  Description:  Due to sedation no wakefulness could be evaluated during this recording.  Throughout the recording the patient exhibits activity that resembles stage II sleep.  There is an irregular, slow background with superimposed symmetrical sleep spindles and vertex central sharp transients.  This activity is not continuous.  During the recording there are frequent periods of background attenuation.  These periods are generalized and last up to 10 seconds.  There is no change in the clinical activity during these periods of attenuation.   Hyperventilation was not performed and intermittent photic stimulation were not performed.  IMPRESSION: This electroencephalogram is characterized by stage II sleep activity that is discontinuous due to intervening periods of generalized attenuation with no change in clinical activity.       Alexis Goodell, MD [facility] [phone] 05/28/2015, 12:05 PM

## 2016-12-14 IMAGING — CR DG CHEST 1V PORT
1 series · 1 of 1 positions shown · non-contrast
Comparison: 05/09/2015.

CLINICAL DATA: Shortness of breath.

EXAM:
PORTABLE CHEST 1 VIEW

[ap]
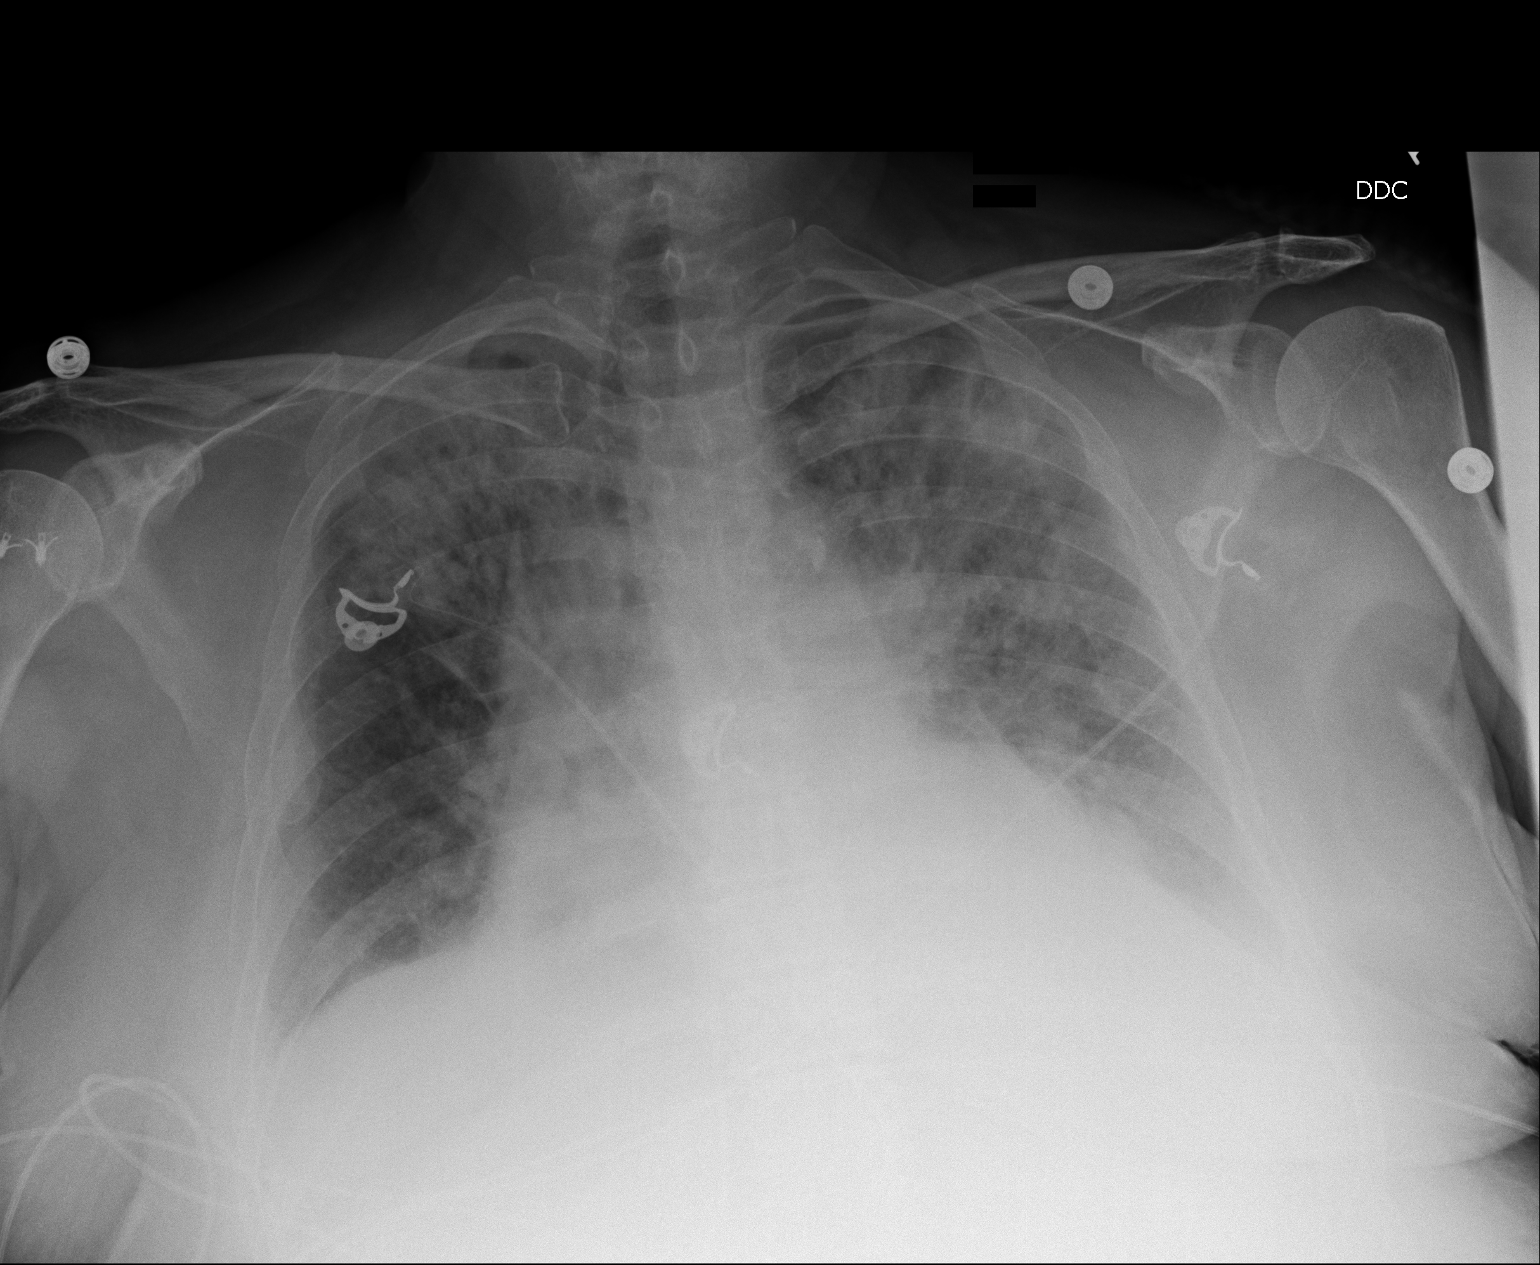

[1 of 1 positions shown; findings below may reference images not displayed]

FINDINGS: Cardiomegaly with diffuse bilateral pulmonary alveolar infiltrates
again noted. There is interim improvement from prior exam. Small
left pleural effusion cannot be excluded. No pneumothorax.
IMPRESSION: Cardiomegaly with persistent bilateral pulmonary alveolar
infiltrates, predominantly in the upper lobes. Slight interim
improvement from prior exam. Small left pleural effusion cannot be
excluded. Findings consistent with improving congestive heart
failure. Bilateral upper lobe pneumonia cannot be excluded .

## 2016-12-18 IMAGING — CR DG CHEST 1V PORT
1 series · 1 of 1 positions shown · non-contrast
Comparison: Single view of the chest 05/13/2015 and 05/12/2015.

CLINICAL DATA: Chest congestion and cough. The patient was
difficult to arouse today.

EXAM:
PORTABLE CHEST 1 VIEW

[ap]
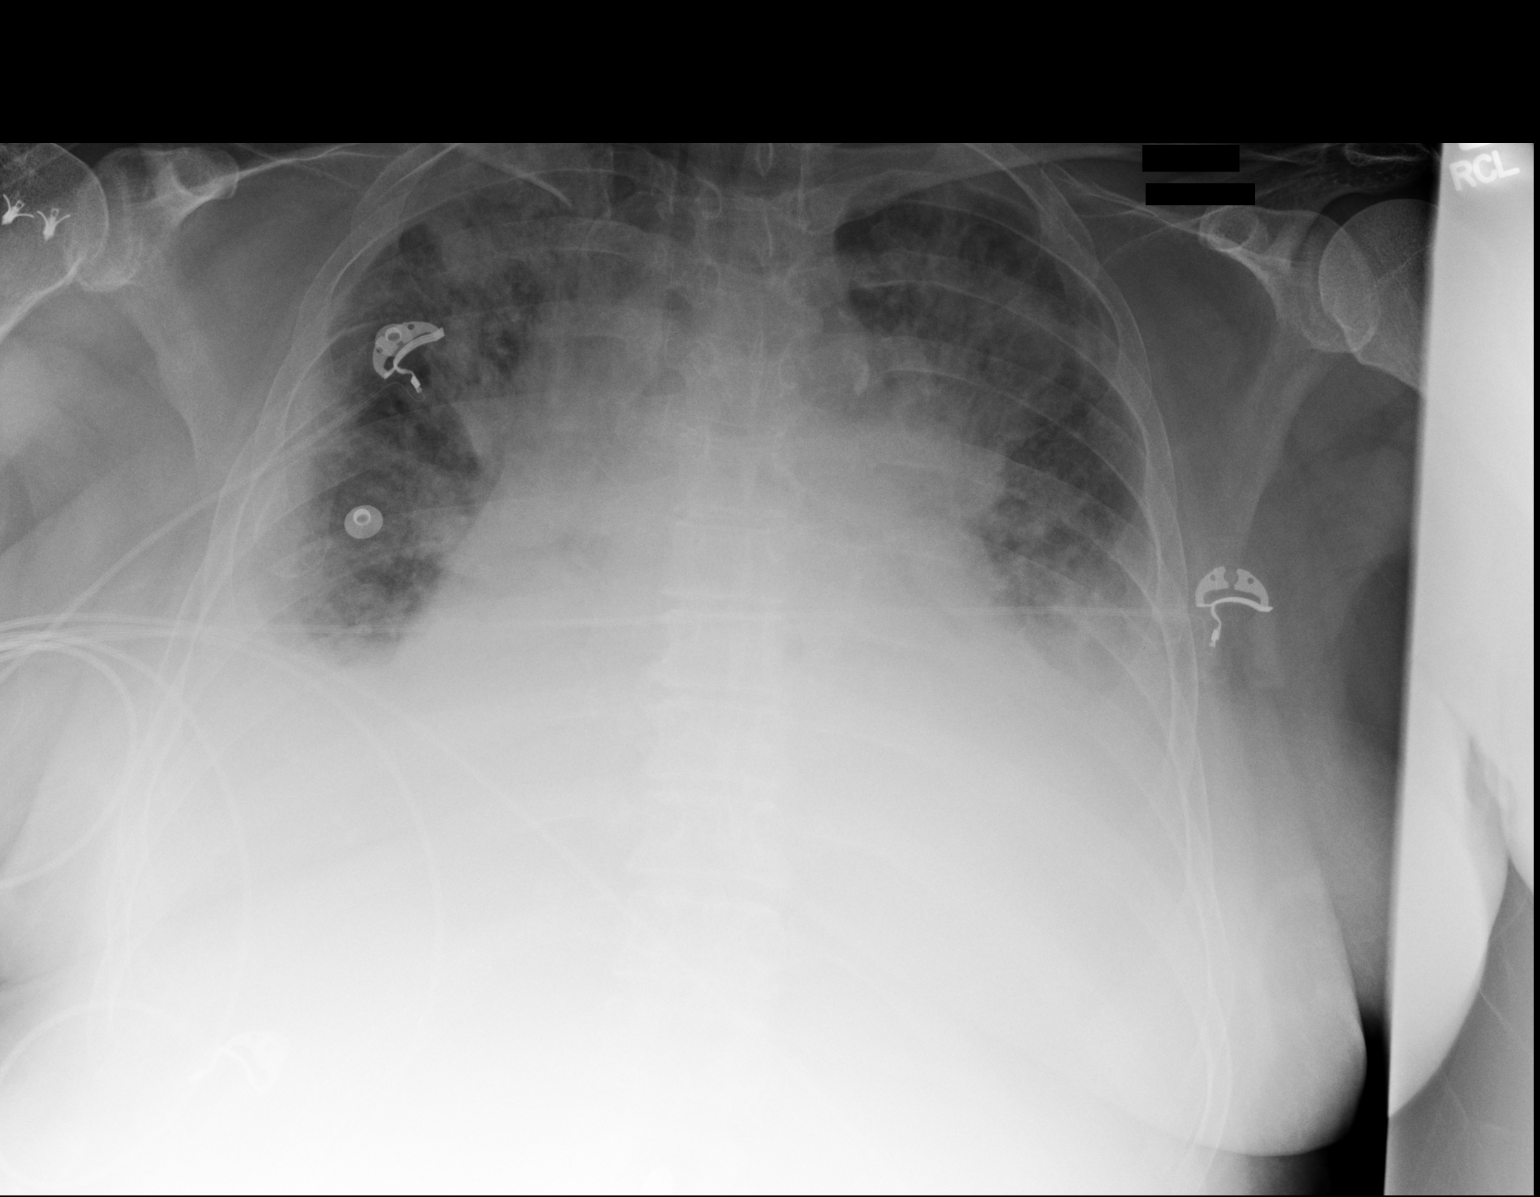

[1 of 1 positions shown; findings below may reference images not displayed]

FINDINGS: Marked cardiomegaly is again seen. There has been interval increase
in a moderate right pleural effusion. Left pleural effusion is
unchanged. Bilateral upper lobe airspace disease identified.
Aeration in the left mid chest appears somewhat improved.
IMPRESSION: Bilateral pleural effusions, markedly increased on the right.

Cardiomegaly and bilateral airspace disease compatible with
pulmonary edema and/or pneumonia. Aeration appears somewhat improved
in the left mid lung zone.

## 2016-12-19 IMAGING — CR DG ABDOMEN 1V
1 series · 1 of 1 positions shown · non-contrast
Comparison: None.

CLINICAL DATA: NG tube placement assessment

EXAM:
ABDOMEN - 1 VIEW

[ap]
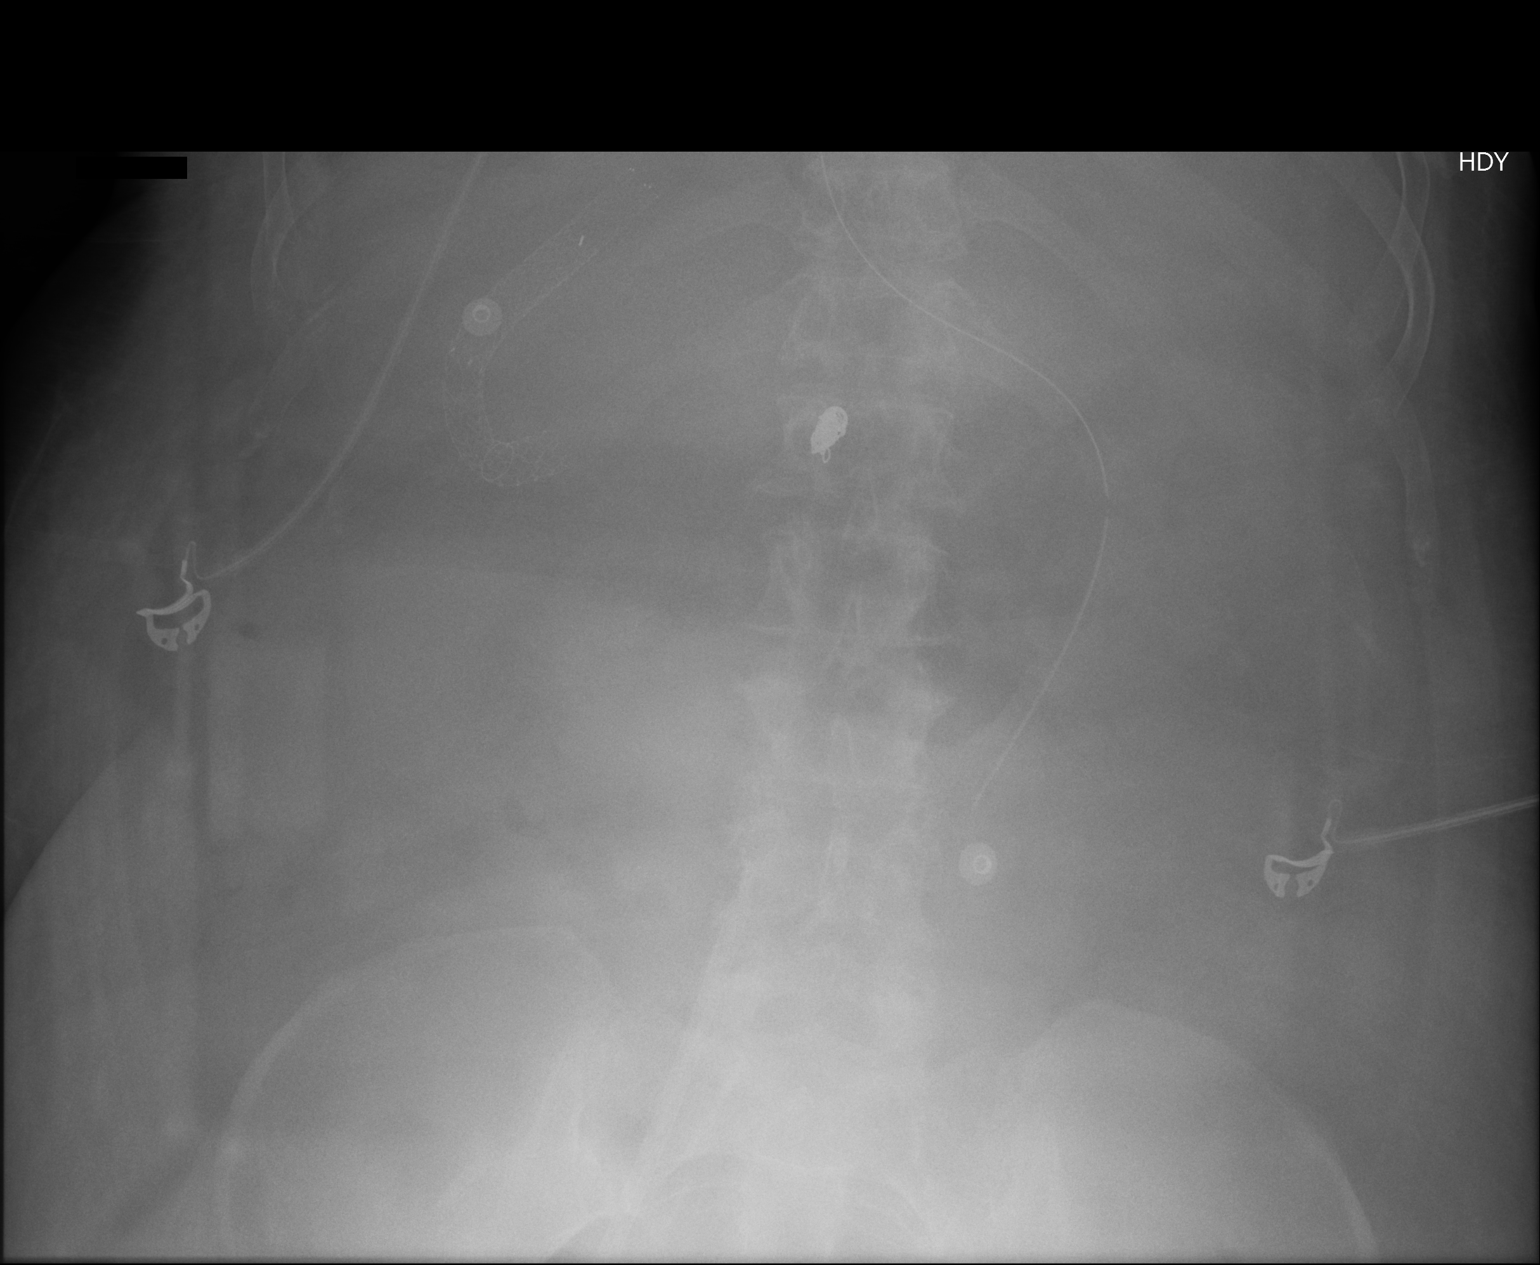

[1 of 1 positions shown; findings below may reference images not displayed]

FINDINGS: Nasogastric tube is identified with distal tip in the mid stomach.
There is relative paucity of bowel gas, nonspecific. A stent is
identified in the right upper quadrant.
IMPRESSION: Nasogastric tube is identified with distal tip in the mid stomach.

## 2016-12-20 IMAGING — CR DG CHEST 1V PORT
1 series · 1 of 1 positions shown · non-contrast
Comparison: the previous day's study

CLINICAL DATA: Respiratory failure

EXAM:
PORTABLE CHEST - 1 VIEW

[portable]
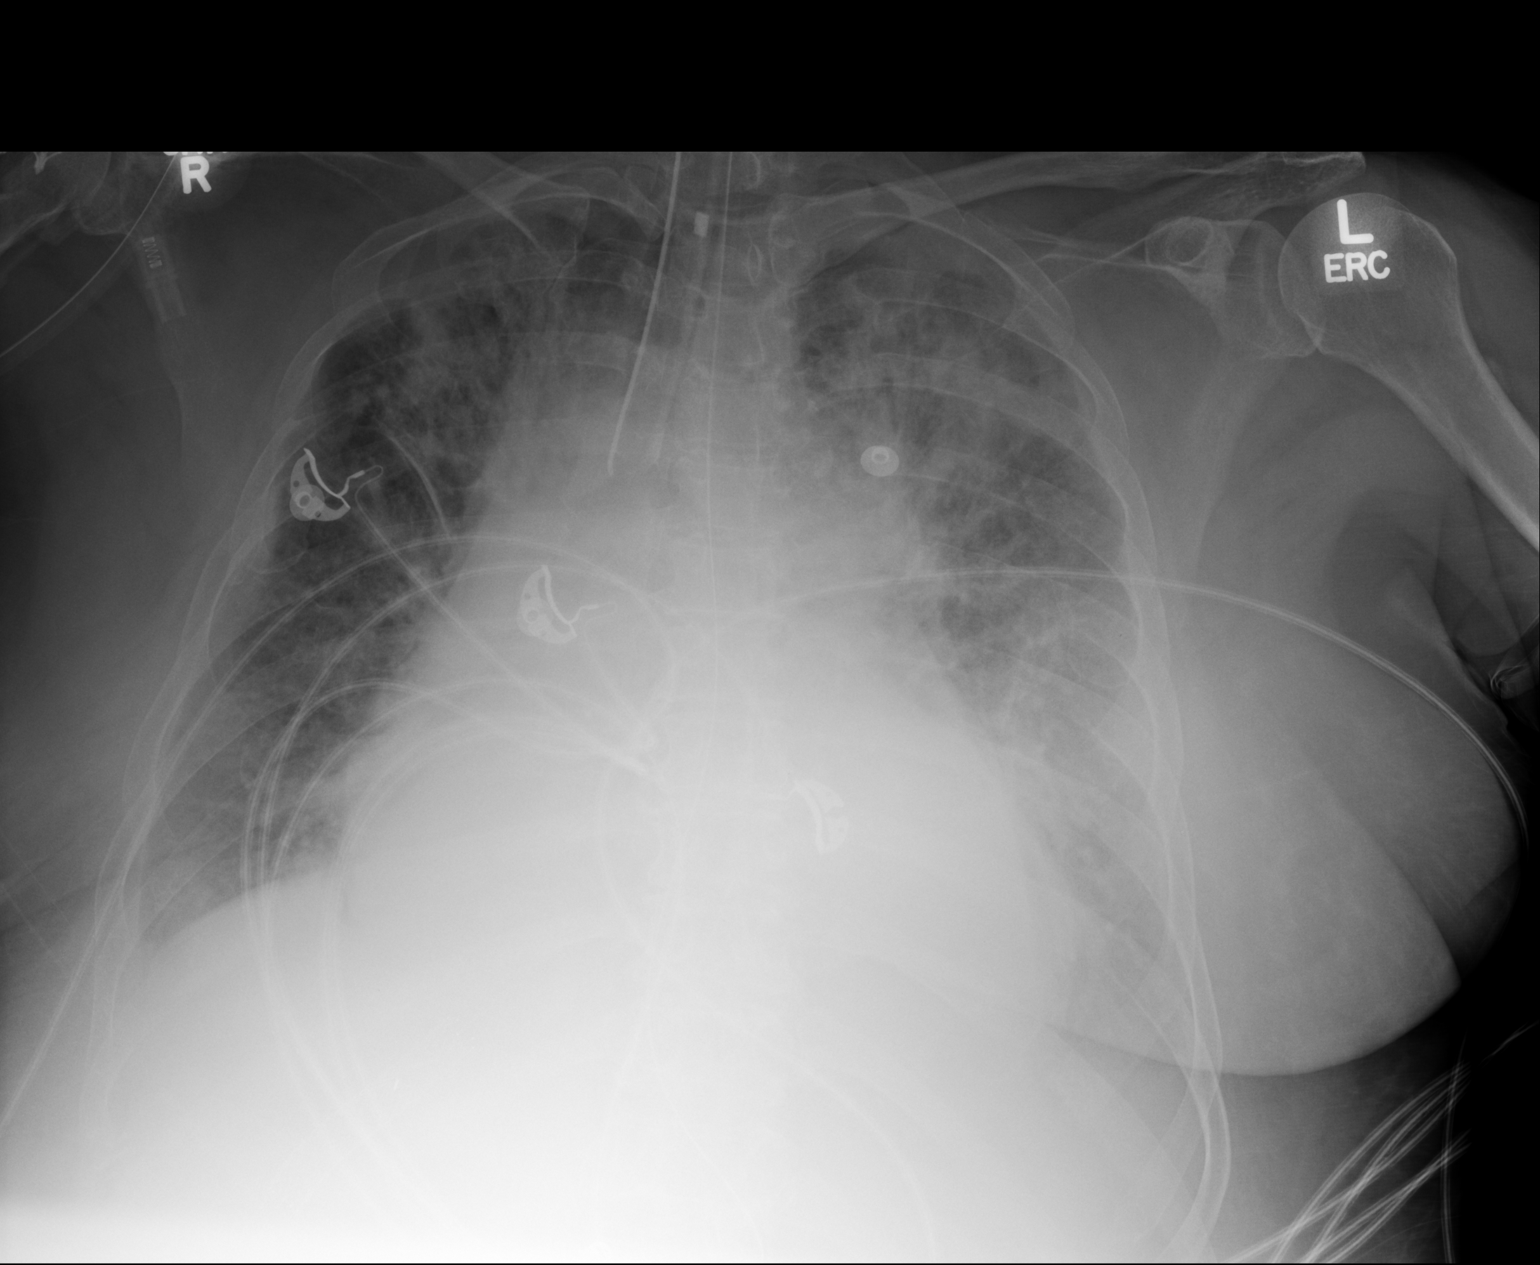

[1 of 1 positions shown; findings below may reference images not displayed]

FINDINGS: The endotracheal tube tip is approximately 1 cm above the carina. A
nasogastric tube extends at least as far as the stomach, tip not
seen.
Patchy interstitial and airspace opacities, left greater than right
age, slightly increased since previous exam. Cardiomegaly stable.
Suspect layering pleural effusions. Orthopedic anchors in the rib
right humeral head are noted.
IMPRESSION: 1. Low position of endotracheal tube, less than 1 cm above carina.
2. Worsening asymmetric infiltrates or edema, left greater than
right.
3. Probable layering pleural effusions.

## 2016-12-23 IMAGING — CR DG CHEST 1V PORT
1 series · 1 of 1 positions shown · non-contrast
Comparison: 05/19/2015.

CLINICAL DATA: Respiratory failure.

EXAM:
PORTABLE CHEST 1 VIEW

[ap]
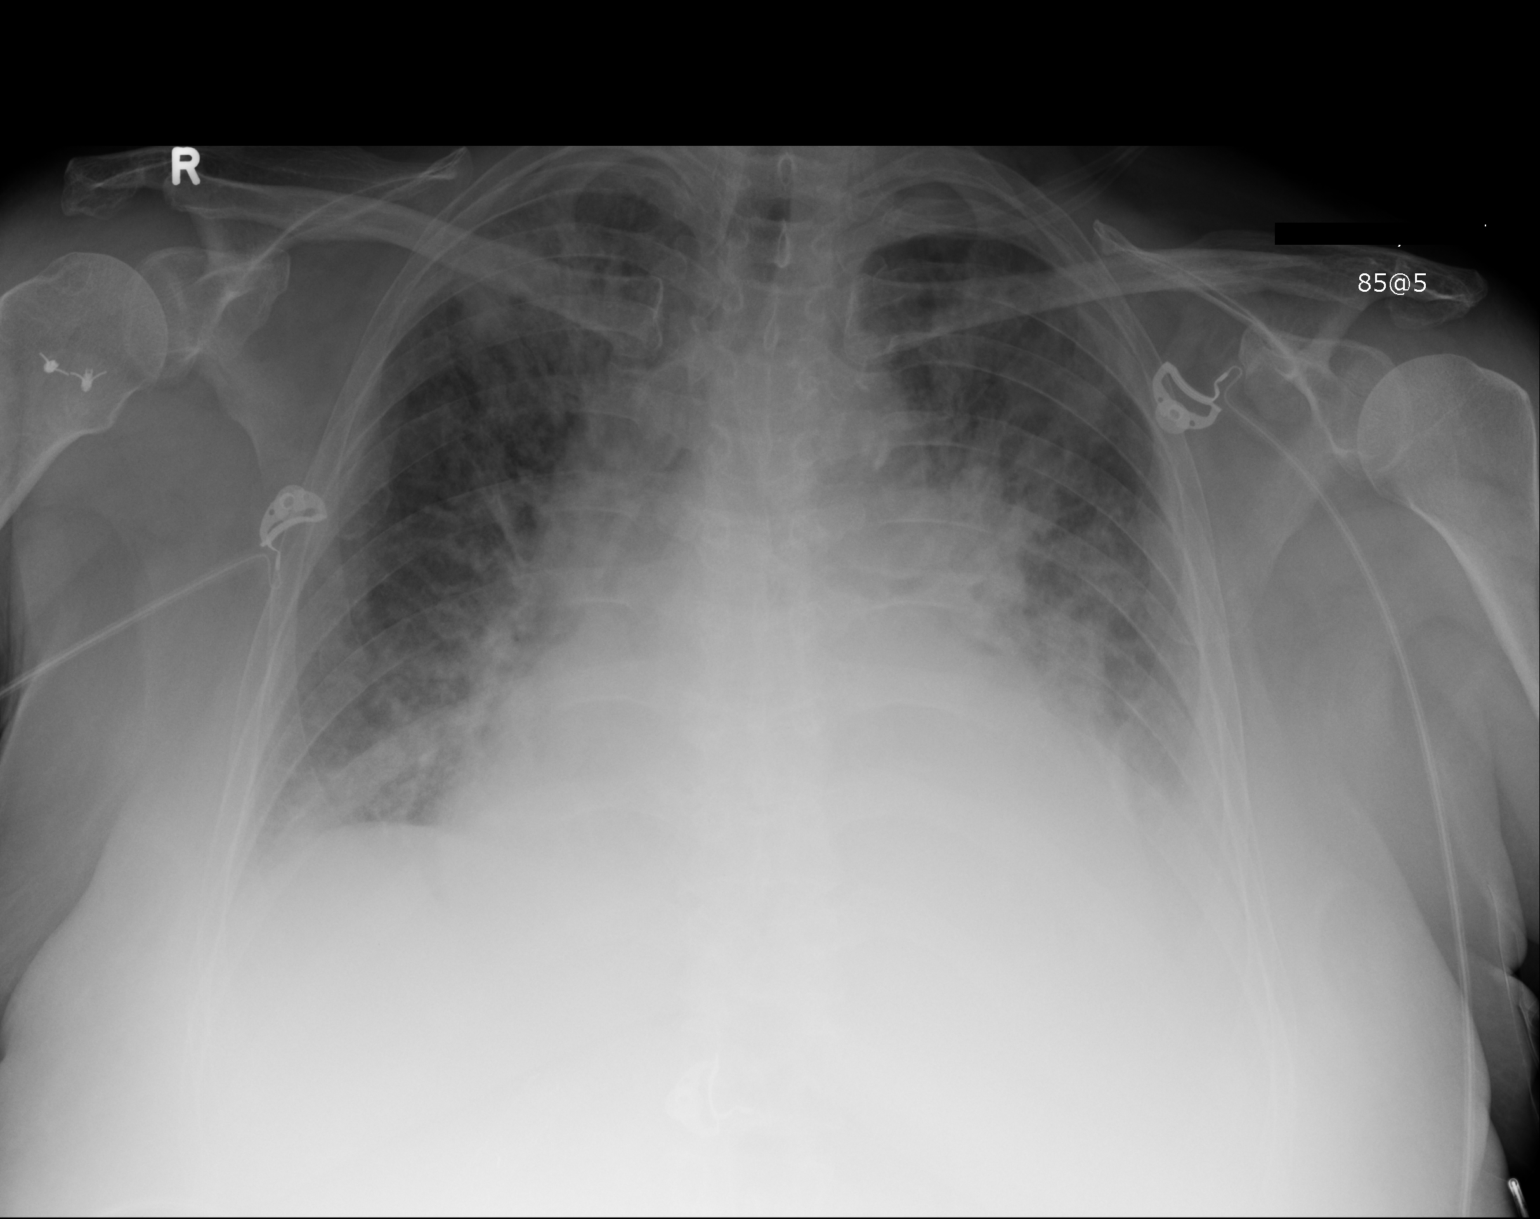

[1 of 1 positions shown; findings below may reference images not displayed]

FINDINGS: Interim extubation removal of endotracheal tube and NG tube.
Cardiomegaly with diffuse pulmonary venous congestion and bilateral
interstitial prominence with bilateral pleural effusions. Findings
are progressed from prior exam. Findings consistent congestive heart
failure. Bilateral pneumonia cannot be excluded. No pneumothorax.
Postsurgical changes right shoulder .
IMPRESSION: 1.  Interim removal of endotracheal tube and NG tube.

2. Congestive heart failure with bilateral pulmonary edema and small
left pleural effusion. Changes have progressed from prior exam .
